# Patient Record
Sex: Female | Born: 1940 | Race: White | Hispanic: No | Smoking: Never smoker
Health system: Southern US, Community
[De-identification: ages and names within clinical notes are randomized; demographics above are authoritative.]

## PROBLEM LIST (undated history)

## (undated) DIAGNOSIS — E781 Pure hyperglyceridemia: Secondary | ICD-10-CM

## (undated) DIAGNOSIS — K219 Gastro-esophageal reflux disease without esophagitis: Secondary | ICD-10-CM

## (undated) DIAGNOSIS — B009 Herpesviral infection, unspecified: Secondary | ICD-10-CM

## (undated) DIAGNOSIS — M549 Dorsalgia, unspecified: Secondary | ICD-10-CM

## (undated) DIAGNOSIS — I10 Essential (primary) hypertension: Secondary | ICD-10-CM

## (undated) DIAGNOSIS — G20A1 Parkinson's disease without dyskinesia, without mention of fluctuations: Secondary | ICD-10-CM

## (undated) DIAGNOSIS — Z79899 Other long term (current) drug therapy: Secondary | ICD-10-CM

## (undated) DIAGNOSIS — R42 Dizziness and giddiness: Secondary | ICD-10-CM

## (undated) DIAGNOSIS — G20 Parkinson's disease: Secondary | ICD-10-CM

## (undated) DIAGNOSIS — K649 Unspecified hemorrhoids: Secondary | ICD-10-CM

## (undated) DIAGNOSIS — C50919 Malignant neoplasm of unspecified site of unspecified female breast: Secondary | ICD-10-CM

## (undated) DIAGNOSIS — F419 Anxiety disorder, unspecified: Secondary | ICD-10-CM

## (undated) DIAGNOSIS — G8929 Other chronic pain: Secondary | ICD-10-CM

## (undated) DIAGNOSIS — K589 Irritable bowel syndrome without diarrhea: Secondary | ICD-10-CM

## (undated) DIAGNOSIS — G43909 Migraine, unspecified, not intractable, without status migrainosus: Secondary | ICD-10-CM

## (undated) DIAGNOSIS — Z8673 Personal history of transient ischemic attack (TIA), and cerebral infarction without residual deficits: Secondary | ICD-10-CM

## (undated) DIAGNOSIS — R8281 Pyuria: Secondary | ICD-10-CM

## (undated) DIAGNOSIS — R251 Tremor, unspecified: Secondary | ICD-10-CM

## (undated) HISTORY — PX: ABDOMINAL HYSTERECTOMY: SHX81

## (undated) HISTORY — PX: BREAST SURGERY: SHX581

## (undated) HISTORY — DX: Pyuria: R82.81

## (undated) HISTORY — PX: MASTECTOMY: SHX3

## (undated) HISTORY — DX: Malignant neoplasm of unspecified site of unspecified female breast: C50.919

## (undated) HISTORY — PX: KNEE ARTHROSCOPY: SHX127

## (undated) HISTORY — PX: TONSILLECTOMY: SUR1361

## (undated) HISTORY — DX: Herpesviral infection, unspecified: B00.9

## (undated) HISTORY — PX: LUMBAR DISC SURGERY: SHX700

## (undated) HISTORY — DX: Other long term (current) drug therapy: Z79.899

## (undated) HISTORY — PX: TUBAL LIGATION: SHX77

## (undated) HISTORY — DX: Parkinson's disease without dyskinesia, without mention of fluctuations: G20.A1

## (undated) HISTORY — DX: Tremor, unspecified: R25.1

## (undated) HISTORY — DX: Personal history of transient ischemic attack (TIA), and cerebral infarction without residual deficits: Z86.73

## (undated) HISTORY — DX: Pure hyperglyceridemia: E78.1

## (undated) HISTORY — PX: HERNIA REPAIR: SHX51

## (undated) HISTORY — DX: Parkinson's disease: G20

---

## 2003-02-07 ENCOUNTER — Ambulatory Visit (HOSPITAL_COMMUNITY): Admission: RE | Admit: 2003-02-07 | Discharge: 2003-02-08 | Payer: Self-pay | Admitting: *Deleted

## 2003-02-07 ENCOUNTER — Encounter: Payer: Self-pay | Admitting: *Deleted

## 2003-04-21 ENCOUNTER — Ambulatory Visit (HOSPITAL_COMMUNITY): Admission: RE | Admit: 2003-04-21 | Discharge: 2003-04-21 | Payer: Self-pay | Admitting: *Deleted

## 2003-04-21 ENCOUNTER — Encounter: Payer: Self-pay | Admitting: *Deleted

## 2006-05-28 ENCOUNTER — Ambulatory Visit: Payer: Self-pay | Admitting: Internal Medicine

## 2006-06-11 ENCOUNTER — Ambulatory Visit: Payer: Self-pay | Admitting: Internal Medicine

## 2006-06-11 ENCOUNTER — Ambulatory Visit (HOSPITAL_COMMUNITY): Admission: RE | Admit: 2006-06-11 | Discharge: 2006-06-11 | Payer: Self-pay | Admitting: Internal Medicine

## 2014-02-10 LAB — HM COLONOSCOPY

## 2014-07-05 ENCOUNTER — Encounter: Payer: Self-pay | Admitting: Internal Medicine

## 2014-12-16 LAB — HM MAMMOGRAPHY

## 2015-03-10 LAB — HM COLONOSCOPY

## 2015-05-22 ENCOUNTER — Encounter: Payer: Self-pay | Admitting: *Deleted

## 2015-05-22 ENCOUNTER — Emergency Department
Admission: EM | Admit: 2015-05-22 | Discharge: 2015-05-22 | Disposition: A | Discharge status: Home / Self Care | Payer: Medicare Other | Source: Home / Self Care | Attending: Family Medicine | Admitting: Family Medicine

## 2015-05-22 DIAGNOSIS — I10 Essential (primary) hypertension: Secondary | ICD-10-CM | POA: Diagnosis not present

## 2015-05-22 HISTORY — DX: Dorsalgia, unspecified: M54.9

## 2015-05-22 HISTORY — DX: Dizziness and giddiness: R42

## 2015-05-22 HISTORY — DX: Migraine, unspecified, not intractable, without status migrainosus: G43.909

## 2015-05-22 HISTORY — DX: Essential (primary) hypertension: I10

## 2015-05-22 HISTORY — DX: Unspecified hemorrhoids: K64.9

## 2015-05-22 HISTORY — DX: Gastro-esophageal reflux disease without esophagitis: K21.9

## 2015-05-22 HISTORY — DX: Irritable bowel syndrome, unspecified: K58.9

## 2015-05-22 HISTORY — DX: Other chronic pain: G89.29

## 2015-05-22 HISTORY — DX: Anxiety disorder, unspecified: F41.9

## 2015-05-22 MED ORDER — ACEBUTOLOL HCL 200 MG PO CAPS: ORAL_CAPSULE | ORAL | Status: DC

## 2015-05-22 NOTE — Discharge Instructions (Signed)
Continue present medications.  Monitor Blood pressure daily and record.

## 2015-05-22 NOTE — ED Notes (Signed)
Dana Calderon is here today for her uncontrolled hypertension. She was seen in the ED on 05/20/2015 BP was 180/100. At d/c BP was 166/77 after lying down. No meds were given. Has a NP appt with Dr Ileene Rubens on 05/30/15.

## 2015-05-22 NOTE — ED Provider Notes (Signed)
CSN: [id number]     Arrival date & time 05/22/15  0905 History   First MD Initiated Contact with Patient 05/22/15 1001     Chief Complaint  Patient presents with  . Hypertension    HPI Comments: Patient reports that she is transferring her care to Dr. Ileene Rubens, with her first appointment in 8 days.  She presents today because she is concerned about her elevated BP.  Her BP two days ago was 180/100, and she has been having more frequent headaches during the past two weeks.  However, she also admits that she has been under increased stress:  Undergoing bankruptcy and family problems involving her son.  She states that her BP had been controlled with lisinopril 40mg  daily and Bystolic 30mg  daily.  She states that her insurance will no longer cover Bystolic 30mg  daily, and she is now taking 20mg  daily which is less effective.  Unfortunately she has allergies or intolerance to multiple medications.   The history is provided by the patient.    Past Medical History  Diagnosis Date  . Hypertension   . GERD (gastroesophageal reflux disease)   . Back pain, chronic   . Hemorrhoid   . Anxiety   . IBS (irritable bowel syndrome)   . Migraine   . Vertigo    Past Surgical History  Procedure Laterality Date  . Tonsillectomy    . Abdominal hysterectomy    . Tubal ligation    . Breast surgery    . Lumbar disc surgery    . Knee arthroscopy Left   . Hernia repair     Family History  Problem Relation Age of Onset  . Hypertension Mother   . Stroke Father    History  Substance Use Topics  . Smoking status: Never Smoker   . Smokeless tobacco: Not on file  . Alcohol Use: No   OB History    No data available     Review of Systems  Constitutional: Negative.   Eyes: Negative.   Respiratory: Negative.   Cardiovascular: Negative.   Gastrointestinal: Negative.   Genitourinary: Negative.   Musculoskeletal: Negative.   Skin: Negative.   Neurological: Positive for facial asymmetry and headaches.  Negative for dizziness, speech difficulty, weakness and numbness.    Allergies  Cefuroxime axetil; Verelan; Zithromax; Amlodipine besylate; Atenolol; Avelox; Bentyl; Cardizem; Ciprofloxacin; Clonidine derivatives; Diovan; Doxazosin mesylate; Doxycycline; Erythromycin; Fioricet; Flexeril; Furosemide; Hctz; Hydralazine; Influenza vaccines; Macrobid; Methylprednisolone; Metronidazole; Naproxen; Nexium; Norflex; Paxil; Penicillins; Prednisone; Prozac; Red dye; Spironolactone; Sulfur; Tamiflu; Tizanidine; Valium; Zoloft; and Asa  Home Medications   Prior to Admission medications   Medication Sig Start Date End Date Taking? Authorizing Provider  ALPRAZolam Duanne Moron) 0.25 MG tablet Take 0.25 mg by mouth at bedtime as needed for anxiety.   Yes Historical Provider, MD  clobetasol cream (TEMOVATE) 5.68 % Apply 1 application topically 2 (two) times daily.   Yes Historical Provider, MD  clotrimazole-betamethasone (LOTRISONE) cream Apply 1 application topically 2 (two) times daily.   Yes Historical Provider, MD  diphenoxylate-atropine (LOMOTIL) 2.5-0.025 MG per tablet Take by mouth 4 (four) times daily as needed for diarrhea or loose stools.   Yes Historical Provider, MD  lisinopril (PRINIVIL,ZESTRIL) 40 MG tablet Take 40 mg by mouth daily.   Yes Historical Provider, MD  meclizine (ANTIVERT) 25 MG tablet Take 25 mg by mouth 3 (three) times daily as needed for dizziness.   Yes Historical Provider, MD  Multiple Vitamin (MULTIVITAMIN) tablet Take 1 tablet by mouth daily.  Yes Historical Provider, MD  nebivolol (BYSTOLIC) 10 MG tablet Take 20 mg by mouth daily.   Yes Historical Provider, MD  omeprazole (PRILOSEC) 20 MG capsule Take 20 mg by mouth daily.   Yes Historical Provider, MD  primidone (MYSOLINE) 50 MG tablet Take by mouth 4 (four) times daily.   Yes Historical Provider, MD  UNKNOWN TO PATIENT    Yes Historical Provider, MD  acebutolol (SECTRAL) 200 MG capsule Take one-half tab po each evening for BP  05/22/15   Kandra Nicolas, MD   BP 180/82 mmHg  Pulse 70  Temp(Src) 99.1 F (37.3 C) (Oral)  Resp 16  Ht 5' 6.5" (1.689 m)  Wt 190 lb (86.183 kg)  BMI 30.21 kg/m2  SpO2 98% Physical Exam Nursing notes and Vital Signs reviewed. Appearance:  Patient appears stated age, and in no acute distress.  Patient is obese (BMI 30.2) Eyes:  Pupils are equal, round, and reactive to light and accomodation.  Extraocular movement is intact.  Conjunctivae are not inflamed.  Fundi benign   Nose:   Normal turbinates.  No sinus tenderness.    Pharynx:  Normal Neck:  Supple.  No adenopathy or thyromegaly.  Carotids have no bruits Lungs:  Clear to auscultation.  Breath sounds are equal.  Heart:  Regular rate and rhythm without murmurs, rubs, or gallops.  Abdomen:  Nontender without masses or hepatosplenomegaly.  Bowel sounds are present.  No CVA or flank tenderness.  Extremities:  No edema.  No calf tenderness Skin:  No rash present.   ED Course  Procedures None   MDM   1. Essential hypertension    Begin cautious trial of adding a generic beta selective beta blocker bisoprolol 5mg ,1/2 tab each evening (has similar profile to Bystolic) Rx #5, one ref.  Continue present medications.  Monitor Blood pressure daily and record. Followup PCP in 6 days as scheduled    Kandra Nicolas, MD 05/25/15 1019

## 2015-05-30 ENCOUNTER — Encounter: Payer: Self-pay | Admitting: Family Medicine

## 2015-05-30 ENCOUNTER — Ambulatory Visit (INDEPENDENT_AMBULATORY_CARE_PROVIDER_SITE_OTHER): Payer: Medicare Other | Admitting: Family Medicine

## 2015-05-30 VITALS — BP 194/90 | HR 56 | Ht 66.5 in | Wt 193.0 lb

## 2015-05-30 DIAGNOSIS — R7303 Prediabetes: Secondary | ICD-10-CM | POA: Insufficient documentation

## 2015-05-30 DIAGNOSIS — L219 Seborrheic dermatitis, unspecified: Secondary | ICD-10-CM | POA: Diagnosis not present

## 2015-05-30 DIAGNOSIS — I10 Essential (primary) hypertension: Secondary | ICD-10-CM | POA: Insufficient documentation

## 2015-05-30 DIAGNOSIS — R7309 Other abnormal glucose: Secondary | ICD-10-CM | POA: Diagnosis not present

## 2015-05-30 MED ORDER — BISOPROLOL FUMARATE 5 MG PO TABS: ORAL_TABLET | ORAL | Status: DC

## 2015-05-30 MED ORDER — KETOCONAZOLE 2 % EX SHAM: 1.0000 "application " | MEDICATED_SHAMPOO | CUTANEOUS | Status: DC

## 2015-05-30 NOTE — Progress Notes (Signed)
CC: Dana Calderon is a 74 y.o. female is here for Establish Care   Subjective: HPI:  Very pleasant 74 year old here to establish care  She reports a history of essential hypertension which has been well controlled up until 6 months ago. Around this time Bystolic had increased to 30 mg daily however her insurance will only cover 20 mg daily. She's also been taking lisinopril but the combination of these 2 with her insurance restrictions have been ineffective with controlling blood pressure. She was seen at a local urgent care last week and started on an additional beta blocker, bisoprolol. She brings in blood pressures that reflect that her blood pressure has slightly improved but only from stage II hypertension to stage I hypertension since starting this medication. 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 Nothing else seems to make symptoms better or worse.  She reports a history of prediabetes with last 6 years. A1c has never been in diabetic range and most recently in April was 6.1. She tries to watch what she eats, she once did not do this to see if it would affect her A1c and it didn't do any change whatsoever.  Complains of itchiness of her scalp left greater than right. It's been present for an unknown amount of time however she's been prescribed clobetasol which does not seem to be helping. She also tells me she has an intolerance to prednisone and has only been using the clobetasol once a day and fears of having an intolerance.  She denies any flaking or skin changes other than a warmth and an itch. His present on a daily basis mild in severity  Review of Systems - General ROS: negative for - chills, fever, night sweats, weight gain or weight loss Ophthalmic ROS: negative for - decreased vision Psychological ROS: negative for - anxiety or depression ENT ROS: negative  for - hearing change, nasal congestion, tinnitus or allergies Hematological and Lymphatic ROS: negative for - bleeding problems, bruising or swollen lymph nodes Breast ROS: negative Respiratory ROS: no cough, shortness of breath, or wheezing Cardiovascular ROS: no chest pain or dyspnea on exertion Gastrointestinal ROS: no abdominal pain, change in bowel habits, or black or bloody stools Genito-Urinary ROS: negative for - genital discharge, genital ulcers, incontinence or abnormal bleeding from genitals Musculoskeletal ROS: negative for - joint pain or muscle pain Neurological ROS: negative for - headaches or memory loss Dermatological ROS: negative for lumps, mole changes, rash and skin lesion changes other than that described above  Past Medical History  Diagnosis Date  . Hypertension   . GERD (gastroesophageal reflux disease)   . Back pain, chronic   . Hemorrhoid   . Anxiety   . IBS (irritable bowel syndrome)   . Migraine   . Vertigo     Past Surgical History  Procedure Laterality Date  . Tonsillectomy    . Abdominal hysterectomy    . Tubal ligation    . Breast surgery    . Lumbar disc surgery    . Knee arthroscopy Left   . Hernia repair     Family History  Problem Relation Age of Onset  . Hypertension Mother   . Stroke Father     History   Social History  . Marital Status: Divorced    Spouse Name: N/A  . Number of Children: N/A  . Years of Education:  N/A   Occupational History  . Not on file.   Social History Main Topics  . Smoking status: Never Smoker   . Smokeless tobacco: Not on file  . Alcohol Use: No  . Drug Use: No  . Sexual Activity: Not Currently   Other Topics Concern  . Not on file   Social History Narrative     Objective: BP 194/90 mmHg  Pulse 56  Ht 5' 6.5" (1.689 m)  Wt 193 lb (87.544 kg)  BMI 30.69 kg/m2  General: Alert and Oriented, No Acute Distress HEENT: Pupils equal, round, reactive to light. Conjunctivae clear.  Moist meters  membranes Unremarkable.  Lungs: Clear to auscultation bilaterally, no wheezing/ronchi/rales.  Comfortable work of breathing. Good air movement. Cardiac: Regular rate and rhythm. Normal S1/S2.  No murmurs, rubs, nor gallops.   Extremities: No peripheral edema.  Strong peripheral pulses.  Mental Status: No depression, anxiety, nor agitation. Skin: Warm and dry. Scalp unremarkable without any flaking or hair loss, if anything the only abnormality would be somewhat dry skin.  Assessment & Plan: Dana Calderon was seen today for establish care.  Diagnoses and all orders for this visit:  Essential hypertension Orders: -     bisoprolol (ZEBETA) 5 MG tablet; Full TABLET BY MOUTH EACH EVENING  Prediabetes  Seborrheic dermatitis Orders: -     ketoconazole (NIZORAL) 2 % shampoo; Apply 1 application topically 2 (two) times a week. Use as a shampoo.   Essential hypertension: Chronic uncontrolled condition increasing bisoprolol to continue with bistolic and lisinopril. Check daily blood pressures and drop off a log in 5 days, if normotensive then she can push out follow-up to 3 months Prediabetes: Clinically controlled repeat A1c in July-September Seborrheic dermatitis: Trial of ketoconazole to the scalp instead of clobetasol  Return in about 3 months (around 08/30/2015) for Drop off BP log in five days.Marland Kitchen

## 2015-06-06 ENCOUNTER — Telehealth: Payer: Self-pay | Admitting: Family Medicine

## 2015-06-06 NOTE — Telephone Encounter (Signed)
Patient walked-in and adv that she has been taking Bp med Bisoprolol 1 whole pill and it has been giving her really bad diarrhea and would like to know if she can just take a half of pill instead of taking a whole pill. Needed a Dr.'s opinion pt would like to get a call back from the nurse to be adv. Thanks

## 2015-06-07 NOTE — Telephone Encounter (Signed)
OK to split tab but remind her she is also supposed to drop off BP record sometime next week for Dr. Ileene Rubens to review.

## 2015-06-08 NOTE — Telephone Encounter (Signed)
Pt.notified

## 2015-06-12 ENCOUNTER — Other Ambulatory Visit: Payer: Self-pay | Admitting: Family Medicine

## 2015-06-14 ENCOUNTER — Telehealth: Payer: Self-pay | Admitting: Family Medicine

## 2015-06-14 MED ORDER — CARVEDILOL 12.5 MG PO TABS: 12.5000 mg | ORAL_TABLET | Freq: Two times a day (BID) | ORAL | Status: DC

## 2015-06-14 NOTE — Telephone Encounter (Signed)
Pt notified and voiced understanding 

## 2015-06-14 NOTE — Telephone Encounter (Signed)
Dana Calderon, Will you please thank patient for dropping off her BP log.  Since she's intolerant of taking a full tab of bisoprolol and her insurance complicates how much Bystolic can be prescribed I'd recommend stopping both of these and switching to a different type of beta blocker called carvedilol that [facility].  This should be taken in addition to lisinopril.  Can she please drop off another BP log after starting this medication? It only needs to be a week long.

## 2015-06-16 ENCOUNTER — Telehealth: Payer: Self-pay

## 2015-06-16 NOTE — Telephone Encounter (Signed)
Dana Calderon complains of itching in mouth and ears after taking Coreg. She took benadryl and this helped with her symptoms.

## 2015-06-16 NOTE — Telephone Encounter (Signed)
It's possible it could be a direct side effect however I've never heard of that type of reaction and don't see any documented reaction like this from the manufacturer.  I'd recommend continuing it over the weekend and if symptoms persist I'll be happy to switch to a different beta-blocker.

## 2015-06-16 NOTE — Telephone Encounter (Signed)
Patient advised.

## 2015-06-22 ENCOUNTER — Telehealth: Payer: Self-pay | Admitting: *Deleted

## 2015-06-22 NOTE — Telephone Encounter (Signed)
Pt left vm this am stating that since starting the carvedilol, her pulse has increased to around 75.  In her vm she stated that she had some kind of bug yesterday and had several bouts of diarrhea.  She stated that she felt a little better today so she took a shower.  Upon getting out of the shower, she started feeling dizzy so she got some water & was going to lay down.  When I called her back, she stated that her chest was starting to feel funny as well as her head was starting to hurt.  She voiced that she felt like she needed to go see someone.  I agreed immediately and had Kelsi call 911 while I remained on the phone with Ms. Dana Calderon.  While waiting for the ambulance, I had her check her vitals; BP was 172/90 p64.  Her pressure earlier this morning was 124/69.  I remained on the line with her until I heard her open the door for EMS.  Just an FYI.

## 2015-06-30 ENCOUNTER — Encounter: Payer: Self-pay | Admitting: Family Medicine

## 2015-06-30 ENCOUNTER — Ambulatory Visit (INDEPENDENT_AMBULATORY_CARE_PROVIDER_SITE_OTHER): Payer: Medicare Other | Admitting: Family Medicine

## 2015-06-30 VITALS — BP 141/69 | HR 71 | Wt 196.0 lb

## 2015-06-30 DIAGNOSIS — K1370 Unspecified lesions of oral mucosa: Secondary | ICD-10-CM

## 2015-06-30 DIAGNOSIS — F419 Anxiety disorder, unspecified: Secondary | ICD-10-CM

## 2015-06-30 DIAGNOSIS — F450 Somatization disorder: Secondary | ICD-10-CM

## 2015-06-30 DIAGNOSIS — R079 Chest pain, unspecified: Secondary | ICD-10-CM | POA: Insufficient documentation

## 2015-06-30 DIAGNOSIS — I10 Essential (primary) hypertension: Secondary | ICD-10-CM | POA: Diagnosis not present

## 2015-06-30 MED ORDER — CARVEDILOL 12.5 MG PO TABS: 12.5000 mg | ORAL_TABLET | Freq: Two times a day (BID) | ORAL | Status: DC

## 2015-06-30 NOTE — Progress Notes (Signed)
CC: Dana Calderon is a 74 y.o. female is here for hospital f/u   Subjective: HPI:  Hospital follow-up from last week. She awoke with chest pain that radiated from the center of her chest into her back. When she started to feel lightheaded after taking a shower she called EMS. She was taken to a local hospital had serial troponins that were normal, she had an unremarkable chest x-ray and a EKG that only showed right bundle branch block. She also had some blood work done all of which was unremarkable. Pain eventually subsided after a few hours of the hospital. She was advised to get a treadmill stress echo however she did not feel like she had the stamina for this, a pharmacologic stress test was then recommended however she was fearful that the injection would cause an allergic reaction. She and the hospital team reached a compromise with a nuclear medicine stress test however within a few seconds of receiving the tracer in her vein she had intense burning that slowly "circulated around my body." This test was ultimately abandoned. She has not had any return of her chest discomfort since the day it came on. She denies any cough, wheezing, shortness of breath, abdominal pain, back pain, motor or sensory disturbances.  She brings in a blood pressure log showing over 90% of her blood pressures being in the normotensive range.  She has a lesion on the buccal surface of her right cheek that has been present for 1-2 weeks. Slightly tender to touch. She thinks she keeps biting in her sleep. She has no history of tobacco use and rarely drinks alcohol.  Review Of Systems Outlined In HPI  Past Medical History  Diagnosis Date  . Hypertension   . GERD (gastroesophageal reflux disease)   . Back pain, chronic   . Hemorrhoid   . Anxiety   . IBS (irritable bowel syndrome)   . Migraine   . Vertigo     Past Surgical History  Procedure Laterality Date  . Tonsillectomy    . Abdominal hysterectomy    . Tubal  ligation    . Breast surgery    . Lumbar disc surgery    . Knee arthroscopy Left   . Hernia repair     Family History  Problem Relation Age of Onset  . Hypertension Mother   . Stroke Father     History   Social History  . Marital Status: Divorced    Spouse Name: N/A  . Number of Children: N/A  . Years of Education: N/A   Occupational History  . Not on file.   Social History Main Topics  . Smoking status: Never Smoker   . Smokeless tobacco: Not on file  . Alcohol Use: No  . Drug Use: No  . Sexual Activity: Not Currently   Other Topics Concern  . Not on file   Social History Narrative     Objective: BP 141/69 mmHg  Pulse 71  Wt 196 lb (88.905 kg)  General: Alert and Oriented, No Acute Distress HEENT: Pupils equal, round, reactive to light. Conjunctivae clear.  Moist mucous membranes. 5 mm diameter rod shaped fleshy lesion on the right buccal aspect of her cheek, no cauliflower appearance Lungs: Clear to auscultation bilaterally, no wheezing/ronchi/rales.  Comfortable work of breathing. Good air movement. Cardiac: Regular rate and rhythm. Normal S1/S2.  No murmurs, rubs, nor gallops.   Extremities: No peripheral edema.  Strong peripheral pulses.  Mental Status: No depression, anxiety, nor agitation.  Skin: Warm and dry.  Assessment & Plan: Raylie was seen today for hospital f/u.  Diagnoses and all orders for this visit:  Chest pain, unspecified chest pain type  Anxiety  Essential hypertension  Mouth lesion  Other orders -     carvedilol (COREG) 12.5 MG tablet; Take 1 tablet (12.5 mg total) by mouth 2 (two) times daily with a meal.   Chest pain: I also agree this is most likely due to anxiety, with the current situation she is facing helping her son through a divorce Essential hypertension: Controlled Mouth lesion: Urgent referral to ear nose and throat for second opinion since I cannot completely rule out a neoplasm. Discussed that it is reassuring that  there is no cauliflower appearance and that it is smooth however it would be best to see a specialist. She thinks this is a bit aggressive final joint decision was to follow-up in a few weeks to see if it has resolved on its own. This could simply be due to trauma from her biting her cheek  25 minutes spent face-to-face during visit today of which at least 50% was counseling or coordinating care regarding: 1. Chest pain, unspecified chest pain type   2. Anxiety   3. Essential hypertension   4. Mouth lesion       No Follow-up on file.

## 2015-07-06 ENCOUNTER — Telehealth: Payer: Self-pay | Admitting: *Deleted

## 2015-07-07 ENCOUNTER — Telehealth: Payer: Self-pay | Admitting: *Deleted

## 2015-07-07 MED ORDER — CARVEDILOL 25 MG PO TABS: 25.0000 mg | ORAL_TABLET | Freq: Two times a day (BID) | ORAL | Status: DC

## 2015-07-07 NOTE — Telephone Encounter (Signed)
I would agree that a higher dose of carvediolol would be beneficial, [facility]  Please drop off a one week long BP diary after making this switch.

## 2015-07-07 NOTE — Telephone Encounter (Signed)
Pt notified of new rx & to keep a BP log for the next week.

## 2015-07-07 NOTE — Telephone Encounter (Signed)
Pt left vm stating that her BP is still really elevated.  She went to the ED last night with 179/112.  She said this morning it's already 161/94.  She states that she feels like what she's going through in her life is causing this but she's wondering if she needs a higher dose of carvedilol.  Please advise.

## 2015-07-10 ENCOUNTER — Encounter: Payer: Self-pay | Admitting: *Deleted

## 2015-07-18 ENCOUNTER — Encounter: Payer: Self-pay | Admitting: Family Medicine

## 2015-07-18 ENCOUNTER — Ambulatory Visit (INDEPENDENT_AMBULATORY_CARE_PROVIDER_SITE_OTHER): Payer: Medicare Other | Admitting: Family Medicine

## 2015-07-18 VITALS — BP 191/85 | HR 66 | Wt 195.0 lb

## 2015-07-18 DIAGNOSIS — I10 Essential (primary) hypertension: Secondary | ICD-10-CM | POA: Diagnosis not present

## 2015-07-18 MED ORDER — CARVEDILOL 25 MG PO TABS: 12.5000 mg | ORAL_TABLET | Freq: Two times a day (BID) | ORAL | Status: DC

## 2015-07-18 MED ORDER — AZILSARTAN MEDOXOMIL 40 MG PO TABS: 40.0000 mg | ORAL_TABLET | Freq: Every day | ORAL | Status: DC

## 2015-07-18 NOTE — Progress Notes (Signed)
CC: Dana Calderon is a 74 y.o. female is here for Hypertension   Subjective: HPI:  Follow-up essential hypertension: Since I saw her last she increased her carvedilol to 25 mg twice a day. She brings in blood pressure showing that this only minimally improved her stage I and stage II hypertensive readings at home. She was getting lightheadedness every morning when taking this dose. She cut back down to 1-1/2 of the 12.5 mg twice a day and had no improvement with blood pressure but mild lightheadedness. She is currently also taking 40 mg of lisinopril. 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 Medications are currently only mildly helping her blood pressure. Since I saw her last she denies any new chest pain, motor or sensory disturbances, orthopnea nor peripheral edema.   Review Of Systems Outlined In HPI  Past Medical History  Diagnosis Date  . Hypertension   . GERD (gastroesophageal reflux disease)   . Back pain, chronic   . Hemorrhoid   . Anxiety   . IBS (irritable bowel syndrome)   . Migraine   . Vertigo     Past Surgical History  Procedure Laterality Date  . Tonsillectomy    . Abdominal hysterectomy    . Tubal ligation    . Breast surgery    . Lumbar disc surgery    . Knee arthroscopy Left   . Hernia repair     Family History  Problem Relation Age of Onset  . Hypertension Mother   . Stroke Father     Social History   Social History  . Marital Status: Divorced    Spouse Name: N/A  . Number of Children: N/A  . Years of Education: N/A   Occupational History  . Not on file.   Social History Main Topics  . Smoking status: Never Smoker   . Smokeless tobacco: Not on file  . Alcohol Use: No  . Drug Use: No  . Sexual Activity: Not Currently   Other Topics Concern  . Not on file   Social History Narrative     Objective: BP 191/85 mmHg  Pulse 66  Wt 195 lb (88.451 kg)  Vital signs reviewed. General: Alert and  Oriented, No Acute Distress HEENT: Pupils equal, round, reactive to light. Conjunctivae clear.  External ears unremarkable.  Moist mucous membranes. Lungs: Clear and comfortable work of breathing, speaking in full sentences without accessory muscle use. Cardiac: Regular rate and rhythm.  Neuro: CN II-XII grossly intact, gait normal. Extremities: No peripheral edema.  Strong peripheral pulses.  Mental Status: No depression, anxiety, nor agitation. Logical though process. Skin: Warm and dry. Assessment & Plan: [name] was seen today for hypertension.  Diagnoses and all orders for this visit:  Essential hypertension -     Azilsartan Medoxomil (EDARBI) 40 MG TABS; Take 40 mg by mouth daily.  Other orders -     carvedilol (COREG) 25 MG tablet; Take 0.5 tablets (12.5 mg total) by mouth 2 (two) times daily with a meal.   Essential hypertension: Uncontrolled chronic condition cutting back carvedilol to a total of 12.5 mg twice a day since this provided no side effects. Stop lisinopril and begin Edarbi. Continue to take daily blood pressures.Signs and symptoms requring emergent/urgent reevaluation were discussed with the patient.  25 minutes spent face-to-face during visit today of which at least 50% was counseling or coordinating care regarding: 1. Essential hypertension      Return in about 2  weeks (around 08/01/2015).

## 2015-07-20 ENCOUNTER — Telehealth: Payer: Self-pay | Admitting: *Deleted

## 2015-07-20 ENCOUNTER — Encounter: Payer: Self-pay | Admitting: Family Medicine

## 2015-07-20 MED ORDER — CHLORTHALIDONE 50 MG PO TABS: 50.0000 mg | ORAL_TABLET | Freq: Every day | ORAL | Status: DC

## 2015-07-20 NOTE — Telephone Encounter (Signed)
Pt left a message stating that for that past two nights she has cut the Edarbe in half an has only taken 20 mg. She says it makes her heart pound, she has hot flashes and her feet are starting to swell. She says she does not think she can take the medication. Please advise

## 2015-07-20 NOTE — Telephone Encounter (Signed)
Seth Bake, Will you please let patient know that i've added this to her intolerance list, I'd recommend switching to chlorthalidone [facility].  If this does not work or causes an intolerance then I've run out of all anti-hypertensive options. Continue carvedilol.  The only remaining option would be that I would write a letter to her insurance company asking that they reconsider better coverage of bistolic since she has proven to be intolerant to literally all other medication classes.

## 2015-07-20 NOTE — Telephone Encounter (Signed)
Pt.notified

## 2015-07-24 ENCOUNTER — Telehealth: Payer: Self-pay | Admitting: Family Medicine

## 2015-07-24 MED ORDER — NEBIVOLOL HCL 20 MG PO TABS: ORAL_TABLET | ORAL | Status: DC

## 2015-07-24 NOTE — Telephone Encounter (Signed)
Pt.notified

## 2015-07-24 NOTE — Telephone Encounter (Signed)
Seth Bake, Will you please thank patient for dropping off her bp log.  Her numbers look much improved since starting half a chlorthalidone. Carvedilol should not be taken along with bystolic due to a risk of lowering the heart rate to a point where an individual might pass out.  I'd recommend continuing the carvediolol and chlorthalidone.

## 2015-07-24 NOTE — Telephone Encounter (Signed)
Pt states the chlorathalidone upsets he stomach. It causes her to have diarrhea

## 2015-07-24 NOTE — Telephone Encounter (Signed)
Seth Bake, Will you please let patient know that a 30mg  regimen of bystolic [facility].  If her insurance denies this I will write them a letter for an appeal.  With her extensive list of medication intolerances this is the last option I know of.  If this gets approved then stop taking carvediolol and chlorthalidone.

## 2015-07-27 LAB — FECAL OCCULT BLOOD, GUAIAC: FECAL OCCULT BLD: NEGATIVE

## 2015-08-01 ENCOUNTER — Ambulatory Visit (INDEPENDENT_AMBULATORY_CARE_PROVIDER_SITE_OTHER): Payer: Medicare Other | Admitting: Family Medicine

## 2015-08-01 ENCOUNTER — Encounter: Payer: Self-pay | Admitting: Family Medicine

## 2015-08-01 VITALS — BP 151/79 | HR 63 | Wt 194.0 lb

## 2015-08-01 DIAGNOSIS — M25512 Pain in left shoulder: Secondary | ICD-10-CM | POA: Diagnosis not present

## 2015-08-01 DIAGNOSIS — A084 Viral intestinal infection, unspecified: Secondary | ICD-10-CM | POA: Diagnosis not present

## 2015-08-01 DIAGNOSIS — B009 Herpesviral infection, unspecified: Secondary | ICD-10-CM

## 2015-08-01 DIAGNOSIS — I10 Essential (primary) hypertension: Secondary | ICD-10-CM

## 2015-08-01 MED ORDER — VALACYCLOVIR HCL 500 MG PO TABS: 500.0000 mg | ORAL_TABLET | Freq: Two times a day (BID) | ORAL | Status: DC

## 2015-08-01 NOTE — Progress Notes (Signed)
CC: Dana Calderon is a 74 y.o. female is here for hospital f/u   Subjective: HPI:  Hospital follow up for dehydration and viral gastroenteritis. She was admitted for one day after she was having intractable vomiting, diarrhea with a white count in the 20s. With IV hydration she was available to start keeping down Fluids within 12 hours after admission. CT scan of abdomen and pelvis was unremarkable. All other blood work was unremarkable. Since returning home she is been able to pick up Bystolic. She is tolerating this without any known side effects. She is taking blood pressures at home and arranging from the prehypertensive state to stage I hypertension. She has not had a bowel movement since returning home but has regained all of her hunger without any nausea.  Complains of left shoulder pain that has been present for the last week. It happened suddenly when she was pushing a heavy object across the floor. Symptoms have been persistent ever since. It's worse when taking his shirt off, reaching behind her back, or lying on the left shoulder. It's described as a pulling sensation. It is moderate in severity but absent at rest. She denies any motor or sensory disturbances in the left upper extremity. No intervention as of yet  Complains of a lesion in the mouth localized on the left lower anterior inside cheek. Moderately tender to the touch. It came on within the last 12 hours. The lesion on the right side of her cheek that was observed weeks ago is no longer bothering her. She was also found to have an HSV infection in the vagina while she was in the hospital this last weekend. Since this was present for matter of days antiviral medication was not started.  Denies fevers, chills, chest pain, shortness of breath, abdominal pain, nor any genitourinary complaints other than that described above.   Review Of Systems Outlined In HPI  Past Medical History  Diagnosis Date  . Hypertension   . GERD  (gastroesophageal reflux disease)   . Back pain, chronic   . Hemorrhoid   . Anxiety   . IBS (irritable bowel syndrome)   . Migraine   . Vertigo     Past Surgical History  Procedure Laterality Date  . Tonsillectomy    . Abdominal hysterectomy    . Tubal ligation    . Breast surgery    . Lumbar disc surgery    . Knee arthroscopy Left   . Hernia repair    . Mastectomy     Family History  Problem Relation Age of Onset  . Hypertension Mother   . Stroke Father     Social History   Social History  . Marital Status: Divorced    Spouse Name: N/A  . Number of Children: N/A  . Years of Education: N/A   Occupational History  . Not on file.   Social History Main Topics  . Smoking status: Never Smoker   . Smokeless tobacco: Not on file  . Alcohol Use: No  . Drug Use: No  . Sexual Activity: Not Currently   Other Topics Concern  . Not on file   Social History Narrative     Objective: BP 151/79 mmHg  Pulse 63  Wt 194 lb (87.998 kg)  General: Alert and Oriented, No Acute Distress HEENT: Pupils equal, round, reactive to light. Conjunctivae clear. Moist mucous membranes are unremarkable. There is a 2 mm slightly raised knot the right buccal surface of the cheek just proximal to  the corner of the lip, this no longer has any ulcerated appearance. Its the same color as the surrounding mucosa. There is a 1 mm diameter red shallow ulceration on the cheek facing the left lower canine tooth, quite tender to the touch.  Lungs:Clear comfortable work of breathing  Cardiac: Regular rate and rhythm.  Left shoulder exam reveals full range of motion and strength in all planes of motion and with individual rotator cuff testing. No overlying redness warmth or swelling.  Neer's test negative.  Hawkins test positive. Empty can negative. Crossarm test negative. O'Brien's test negative. Apprehension test negative. Speed's test negative. Extremities: No peripheral edema.  Strong peripheral pulses.   Mental Status: No depression, anxiety, nor agitation. Skin: Warm and dry.  Assessment & Plan: Eola was seen today for hospital f/u.  Diagnoses and all orders for this visit:  HSV (herpes simplex virus) infection -     valACYclovir (VALTREX) 500 MG tablet; Take 1 tablet (500 mg total) by mouth 2 (two) times daily.  Left shoulder pain  Essential hypertension  Viral gastroenteritis   HSV: Discussed that her genital lesions are unlikely to respond to Valtrex given how long they've been present however the oral lesion should respond nicely to the above 3 day regimen. Left shoulder pain: Suspect rotator cuff tear therefore start home rehabilitation exercises for the next 2-3 weeks. Essential hypertension: She is only been on Bystolic for a little under 24 hours now, please drop off blood pressure log after been on this medication for 1 week. Continue chlorthalidone and Bystolic. Viral gastroenteritis: Completely resolved.  40 minutes spent face-to-face during visit today of which at least 50% was counseling or coordinating care regarding: 1. HSV (herpes simplex virus) infection   2. Left shoulder pain   3. Essential hypertension   4. Viral gastroenteritis   And reviewing hospital records and the presence of the patient.   Return in about 4 weeks (around 08/29/2015).

## 2015-08-09 ENCOUNTER — Encounter: Payer: Self-pay | Admitting: Family Medicine

## 2015-08-09 ENCOUNTER — Ambulatory Visit (INDEPENDENT_AMBULATORY_CARE_PROVIDER_SITE_OTHER): Payer: Medicare Other | Admitting: Family Medicine

## 2015-08-09 VITALS — BP 176/84 | HR 58 | Wt 194.0 lb

## 2015-08-09 DIAGNOSIS — R300 Dysuria: Secondary | ICD-10-CM | POA: Diagnosis not present

## 2015-08-09 DIAGNOSIS — N9089 Other specified noninflammatory disorders of vulva and perineum: Secondary | ICD-10-CM | POA: Diagnosis not present

## 2015-08-09 DIAGNOSIS — I4510 Unspecified right bundle-branch block: Secondary | ICD-10-CM

## 2015-08-09 LAB — POCT URINALYSIS DIPSTICK
BILIRUBIN UA: NEGATIVE
Glucose, UA: NEGATIVE
KETONES UA: NEGATIVE
Nitrite, UA: NEGATIVE
PH UA: 7
PROTEIN UA: NEGATIVE
SPEC GRAV UA: 1.01
Urobilinogen, UA: 0.2

## 2015-08-09 MED ORDER — CEPHALEXIN 500 MG PO CAPS
500.0000 mg | ORAL_CAPSULE | Freq: Three times a day (TID) | ORAL | Status: DC
Start: 2015-08-09 — End: 2015-08-29

## 2015-08-09 NOTE — Progress Notes (Signed)
CC: Dana Calderon is a 74 y.o. female is here for Dysuria   Subjective: HPI:  Complains of dysuria and frequency that began overnight last night. Symptoms are bad enough to keep her awake from falling asleep. Symptoms are moderate in severity and have not responded to increased fluids or taking cranberry tablets. No other interventions as of yet. Symptoms feel like a urinary tract infection, her last one was well over a year ago. She denies any flank pain or abdominal pain.  Since I saw her last she has also been experiencing some labial irritation without any vaginal discharge. She took Valtrex for suspicion of HSV however this cleared up a sore on her lip but did nothing to the genital region. She's been using clotrimazole which is only mildly effective.  She was told during a recent hospitalization that she has a right bundle blanch block. She's having trouble conceptualizing what this actually means. She wants to know if this means that her heart is damaged. She denies any chest pain shortness of breath orthopnea nor peripheral edema.   Review Of Systems Outlined In HPI  Past Medical History  Diagnosis Date  . Hypertension   . GERD (gastroesophageal reflux disease)   . Back pain, chronic   . Hemorrhoid   . Anxiety   . IBS (irritable bowel syndrome)   . Migraine   . Vertigo     Past Surgical History  Procedure Laterality Date  . Tonsillectomy    . Abdominal hysterectomy    . Tubal ligation    . Breast surgery    . Lumbar disc surgery    . Knee arthroscopy Left   . Hernia repair    . Mastectomy     Family History  Problem Relation Age of Onset  . Hypertension Mother   . Stroke Father     Social History   Social History  . Marital Status: Divorced    Spouse Name: N/A  . Number of Children: N/A  . Years of Education: N/A   Occupational History  . Not on file.   Social History Main Topics  . Smoking status: Never Smoker   . Smokeless tobacco: Not on file  .  Alcohol Use: No  . Drug Use: No  . Sexual Activity: Not Currently   Other Topics Concern  . Not on file   Social History Narrative     Objective: BP 176/84 mmHg  Pulse 58  Wt 194 lb (87.998 kg)  Vital signs reviewed. General: Alert and Oriented, No Acute Distress HEENT: Pupils equal, round, reactive to light. Conjunctivae clear.  External ears unremarkable.  Moist mucous membranes. Lungs: Clear and comfortable work of breathing, speaking in full sentences without accessory muscle use. Cardiac: Regular rate and rhythm.  Neuro: CN II-XII grossly intact, gait normal. Extremities: No peripheral edema.  Strong peripheral pulses.  Mental Status: No depression, anxiety, nor agitation. Logical though process. Skin: Warm and dry.  Assessment & Plan: Ronasia was seen today for dysuria.  Diagnoses and all orders for this visit:  Dysuria -     Urinalysis Dipstick -     cephALEXin (KEFLEX) 500 MG capsule; Take 1 capsule (500 mg total) by mouth 3 (three) times daily. -     Urine Culture  RBBB  Labial irritation  Dysuria: She is tolerated cephalexin in the past despite her report of shortness of breath with Cefuroxime.  Start 3 times a day dosing pending culture.   right bundle branch block: Point of  this goes back all the way until 2014 on EKGs. Discussed the benign nature of this finding in a symptomatically patient like herself. I had to draw pictures of the heart and the conduction system to help her understand what this branch block actually represents. Labial irritation: Stop clotrimazole start using clobetasol or Lotrisone at home, which ever one she has an abundance of.  40 minutes spent face-to-face during visit today of which at least 50% was counseling or coordinating care regarding: 1. Dysuria   2. RBBB   3. Labial irritation      No Follow-up on file.

## 2015-08-12 LAB — URINE CULTURE: Colony Count: >=100000

## 2015-08-28 ENCOUNTER — Encounter: Payer: Self-pay | Admitting: *Deleted

## 2015-08-29 ENCOUNTER — Encounter: Payer: Self-pay | Admitting: Family Medicine

## 2015-08-29 ENCOUNTER — Ambulatory Visit (INDEPENDENT_AMBULATORY_CARE_PROVIDER_SITE_OTHER): Payer: Medicare Other | Admitting: Family Medicine

## 2015-08-29 VITALS — BP 171/67 | HR 59 | Wt 201.0 lb

## 2015-08-29 DIAGNOSIS — R7303 Prediabetes: Secondary | ICD-10-CM

## 2015-08-29 DIAGNOSIS — I10 Essential (primary) hypertension: Secondary | ICD-10-CM | POA: Diagnosis not present

## 2015-08-29 DIAGNOSIS — Z23 Encounter for immunization: Secondary | ICD-10-CM | POA: Diagnosis not present

## 2015-08-29 DIAGNOSIS — R7309 Other abnormal glucose: Secondary | ICD-10-CM | POA: Diagnosis not present

## 2015-08-29 DIAGNOSIS — K1370 Unspecified lesions of oral mucosa: Secondary | ICD-10-CM | POA: Diagnosis not present

## 2015-08-29 MED ORDER — NEBIVOLOL HCL 20 MG PO TABS: ORAL_TABLET | ORAL | Status: DC

## 2015-08-29 NOTE — Progress Notes (Signed)
CC: Dana Calderon is a 74 y.o. female is here for prediabetes   Subjective: HPI:  Follow-up prediabetes: She is trying to stay more active by exercising during commercial breaks on TV. She is also trying to walk more when out shopping. Her last A1c was in July and was in prediabetic range. Denies polyuria or polyphagia or polydipsia.  Follow-up essential hypertension: She brings in blood pressures today showing that 90% of her readings are in the stage I hypertension range and 10% at stage II. She is taking Bystolic as prescribed with no other antihypertensives. Denies chest pain shortness of breath orthopnea nor peripheral edema  She tells me she keeps biting the lesion in her mouth on the right cheek. It usually heals after a few days of leaving it alone. Is occasionally painful. She does not think it's getting bigger or smaller since it was brought to my attention a few months ago. Denies swollen lymph nodes, unintentional weight loss or fever.   Review Of Systems Outlined In HPI  Past Medical History  Diagnosis Date  . Hypertension   . GERD (gastroesophageal reflux disease)   . Back pain, chronic   . Hemorrhoid   . Anxiety   . IBS (irritable bowel syndrome)   . Migraine   . Vertigo     Past Surgical History  Procedure Laterality Date  . Tonsillectomy    . Abdominal hysterectomy    . Tubal ligation    . Breast surgery    . Lumbar disc surgery    . Knee arthroscopy Left   . Hernia repair    . Mastectomy     Family History  Problem Relation Age of Onset  . Hypertension Mother   . Stroke Father     Social History   Social History  . Marital Status: Divorced    Spouse Name: N/A  . Number of Children: N/A  . Years of Education: N/A   Occupational History  . Not on file.   Social History Main Topics  . Smoking status: Never Smoker   . Smokeless tobacco: Not on file  . Alcohol Use: No  . Drug Use: No  . Sexual Activity: Not Currently   Other Topics Concern  .  Not on file   Social History Narrative     Objective: BP 171/67 mmHg  Pulse 59  Wt 201 lb (91.173 kg)  General: Alert and Oriented, No Acute Distress HEENT: Pupils equal, round, reactive to light. Conjunctivae clear. Moist mucous membranes pharynx unremarkable. There is a nontender approximate half centimeter nodule on the buccal mucosa of the right cheek which is smooth and the same color of surrounding mucosa. Lungs: Clear to auscultation bilaterally, no wheezing/ronchi/rales.  Comfortable work of breathing. Good air movement. Cardiac: Regular rate and rhythm. Normal S1/S2.  No murmurs, rubs, nor gallops.   Extremities: No peripheral edema.  Strong peripheral pulses.  Mental Status: No depression, anxiety, nor agitation. Skin: Warm and dry.  Assessment & Plan: Sheketa was seen today for prediabetes.  Diagnoses and all orders for this visit:  Essential hypertension -     Nebivolol HCl (BYSTOLIC) 20 MG TABS; Two by mouth daily.  Prediabetes -     POCT HgB A1C  Mouth lesion -     Ambulatory referral to ENT   Essential hypertension: Uncontrolled chronic condition increasing Bystolic Prediabetes: E0C of 6.0, controlled, encourage her to continue focusing on reducing carbohydrates in the diet and staying as active as popular. Mouth lesion: Referral  to ear nose and throat, it's reassuring today that it doesn't have the angry appearance from when I first saw however cannot rule out a neoplastic process from my exam alone.   Return in about 3 months (around 11/28/2015) for Blood Sugar.

## 2015-09-19 ENCOUNTER — Telehealth: Payer: Self-pay | Admitting: Family Medicine

## 2015-09-19 NOTE — Telephone Encounter (Signed)
Seth Bake, Will you please let patient know that I'll add her BP values to her chart.  Unfortunately I know of no other medication to help with her blood pressure given her list of allergies and intolerances to other medications.  I think bystolic is the best option I know of and she's already on the highest dose.  The only other thing I can recommend is trying to cut back on sodium as much as possible.

## 2015-09-19 NOTE — Telephone Encounter (Signed)
Pt.notified

## 2015-10-04 ENCOUNTER — Encounter: Payer: Self-pay | Admitting: Family Medicine

## 2015-10-04 ENCOUNTER — Ambulatory Visit (INDEPENDENT_AMBULATORY_CARE_PROVIDER_SITE_OTHER): Payer: Medicare Other | Admitting: Family Medicine

## 2015-10-04 VITALS — BP 161/78 | HR 61 | Wt 194.0 lb

## 2015-10-04 DIAGNOSIS — R509 Fever, unspecified: Secondary | ICD-10-CM | POA: Diagnosis not present

## 2015-10-04 DIAGNOSIS — B009 Herpesviral infection, unspecified: Secondary | ICD-10-CM | POA: Diagnosis not present

## 2015-10-04 MED ORDER — VALACYCLOVIR HCL 500 MG PO TABS: 500.0000 mg | ORAL_TABLET | Freq: Two times a day (BID) | ORAL | Status: DC

## 2015-10-04 MED ORDER — CLOTRIMAZOLE-BETAMETHASONE 1-0.05 % EX CREA: 1.0000 "application " | TOPICAL_CREAM | Freq: Two times a day (BID) | CUTANEOUS | Status: DC

## 2015-10-04 NOTE — Progress Notes (Signed)
CC: Dana Calderon is a 74 y.o. female is here for Herpes Zoster   Subjective: HPI:  Complains of subjective fever with no objective measurements and small little red blisters forming around her vagina. Symptoms have been present for the past 2 days. She tells me it feels like prior herpes outbreaks. She denies any vaginal discharge or dysuria. She denies any other genitourinary complaints. She's been having diarrhea for the past week and this is currently being managed by her gastroenterologist. She denies any abdominal pain. She denies any skin lesions other than what's described above. She's been trying to use clobetasol but it's not helping the pain. She tells me today are seen and she would prefer not to be examined in the genital region if possible. Denies nausea, vomiting, or decreased appetite. No pulmonary complaints. Stool studies at her gastroenterologist have been unremarkable.   Review Of Systems Outlined In HPI  Past Medical History  Diagnosis Date  . Hypertension   . GERD (gastroesophageal reflux disease)   . Back pain, chronic   . Hemorrhoid   . Anxiety   . IBS (irritable bowel syndrome)   . Migraine   . Vertigo     Past Surgical History  Procedure Laterality Date  . Tonsillectomy    . Abdominal hysterectomy    . Tubal ligation    . Breast surgery    . Lumbar disc surgery    . Knee arthroscopy Left   . Hernia repair    . Mastectomy     Family History  Problem Relation Age of Onset  . Hypertension Mother   . Stroke Father     Social History   Social History  . Marital Status: Divorced    Spouse Name: N/A  . Number of Children: N/A  . Years of Education: N/A   Occupational History  . Not on file.   Social History Main Topics  . Smoking status: Never Smoker   . Smokeless tobacco: Not on file  . Alcohol Use: No  . Drug Use: No  . Sexual Activity: Not Currently   Other Topics Concern  . Not on file   Social History Narrative     Objective: BP  161/78 mmHg  Pulse 61  Wt 194 lb (87.998 kg)  Vital signs reviewed. General: Alert and Oriented, No Acute Distress HEENT: Pupils equal, round, reactive to light. Conjunctivae clear.  External ears unremarkable.  Moist mucous membranes. Lungs: Clear and comfortable work of breathing, speaking in full sentences without accessory muscle use. Cardiac: Regular rate and rhythm.  Neuro: CN II-XII grossly intact, gait normal. Extremities: No peripheral edema.  Strong peripheral pulses.  Mental Status: No depression, anxiety, nor agitation. Logical though process. Skin: Warm and dry. Assessment & Plan: Dana Calderon was seen today for herpes zoster.  Diagnoses and all orders for this visit:  HSV (herpes simplex virus) infection -     HSV(herpes simplex vrs) 1+2 ab-IgM -     valACYclovir (VALTREX) 500 MG tablet; Take 1 tablet (500 mg total) by mouth 2 (two) times daily. May refill if new outbreak occurs.  Fever, unspecified -     HSV(herpes simplex vrs) 1+2 ab-IgM  Other orders -     clotrimazole-betamethasone (LOTRISONE) cream; Apply 1 application topically 2 (two) times daily.   Without being able to see these lesions will order IgM antibodies and if elevated can safely assume that her lesions are most likely due to herpes and that her subjective fever could be secondary to  this as well. Start Valtrex pending results. She's also requested a refill on Lotrisone that she uses for candidiasis that occurs in her fat crevices, at the present time she tells me she is asymptomatic.  25 minutes spent face-to-face during visit today of which at least 50% was counseling or coordinating care regarding: 1. HSV (herpes simplex virus) infection   2. Fever, unspecified       Return if symptoms worsen or fail to improve.

## 2015-10-05 LAB — HSV(HERPES SIMPLEX VRS) I + II AB-IGM: Herpes Simplex Vrs I&II-IgM Ab (EIA): 1.16 INDEX — ABNORMAL HIGH

## 2015-10-06 ENCOUNTER — Telehealth: Payer: Self-pay | Admitting: Family Medicine

## 2015-10-06 NOTE — Telephone Encounter (Signed)
Pt advised.

## 2015-10-06 NOTE — Telephone Encounter (Signed)
Will you please let patient know that her blood work confirmed an immune response to the herpes virus, it's a good thing she started the valtrex rx from earlier this week.

## 2015-10-06 NOTE — Telephone Encounter (Signed)
Awaiting call back.

## 2015-10-24 ENCOUNTER — Encounter: Payer: Self-pay | Admitting: Family Medicine

## 2015-10-24 ENCOUNTER — Ambulatory Visit: Payer: Medicare Other | Admitting: Family Medicine

## 2015-10-24 ENCOUNTER — Ambulatory Visit (INDEPENDENT_AMBULATORY_CARE_PROVIDER_SITE_OTHER): Payer: Medicare Other | Admitting: Family Medicine

## 2015-10-24 VITALS — BP 191/83 | HR 60 | Wt 194.0 lb

## 2015-10-24 DIAGNOSIS — L309 Dermatitis, unspecified: Secondary | ICD-10-CM

## 2015-10-24 MED ORDER — NYSTATIN 100000 UNIT/GM EX POWD: CUTANEOUS | Status: DC

## 2015-10-24 NOTE — Progress Notes (Signed)
CC: Dana Calderon is a 74 y.o. female is here for Herpes Zoster and Hypertension   Subjective: HPI:  She no longer has any circular sores on her vagina since taking Valtrex. She has noticed that there is a burning sensation but feels raw between her legs posterior to the vagina and all through her gluteal cleavage. It burns when she puts on Lotrisone,it seems to get worse the more moisture is in this region. She tells me she feels no fevers chills or any signs of sickness. She denies any other genitourinary complaints or diarrhea.   She complains of eye redness in the left eye to mild degree with and present for the past 2 weeks. She denies any vision loss or ocular pain.   She complains of calf tightness in the left calf. It comes and goes on a daily basis. Nothing seems to make it better or worse but it's most noticed first thing in the morning and sometimes if she wakes up in the middle the night. She denies any other motor or sensory disturbances or overlying skin changes. She denies weakness   Review Of Systems Outlined In HPI  Past Medical History  Diagnosis Date  . Hypertension   . GERD (gastroesophageal reflux disease)   . Back pain, chronic   . Hemorrhoid   . Anxiety   . IBS (irritable bowel syndrome)   . Migraine   . Vertigo     Past Surgical History  Procedure Laterality Date  . Tonsillectomy    . Abdominal hysterectomy    . Tubal ligation    . Breast surgery    . Lumbar disc surgery    . Knee arthroscopy Left   . Hernia repair    . Mastectomy     Family History  Problem Relation Age of Onset  . Hypertension Mother   . Stroke Father     Social History   Social History  . Marital Status: Divorced    Spouse Name: N/A  . Number of Children: N/A  . Years of Education: N/A   Occupational History  . Not on file.   Social History Main Topics  . Smoking status: Never Smoker   . Smokeless tobacco: Not on file  . Alcohol Use: No  . Drug Use: No  . Sexual  Activity: Not Currently   Other Topics Concern  . Not on file   Social History Narrative     Objective: BP 191/83 mmHg  Pulse 60  Wt 194 lb (87.998 kg)  General: Alert and Oriented, No Acute Distress HEENT: Pupils equal, round, reactive to light. Conjunctivae clear.  No conjunctival erythema Lungs: clear comfortable work of breathing Cardiac: Regular rate and rhythm.  Rectal/genitourinary: Patient refused Extremities: No peripheral edema.  Strong peripheral pulses. Full range of motion and strength of both lower extremities Mental Status: No depression, anxiety, nor agitation. Skin: Warm and dry.  Assessment & Plan: Cassandr was seen today for herpes zoster and hypertension.  Diagnoses and all orders for this visit:  Dermatitis -     nystatin (MYCOSTATIN/NYSTOP) 100000 UNIT/GM POWD; Apply three times a day for up to one week after resolution of rash.   Reassurance provided that I don't see any abnormality with her eyes, what she's pointing out to is a normal capillary on the conjunctiva surface. Discussed stretches to help with the tightness of her left calf and that this does not represent anything more serious than just tight muscles. Dermatitis: I have offered to examine  the between her buttocks however she is adamant that this is not going to happen. I've let her know that I could provide her a better more firm diagnosis if I were visually able to see her complaint. Since I am not allowed to see it I will try nystatin for fungal or yeast coverage.  25 minutes spent face-to-face during visit today of which at least 50% was counseling or coordinating care regarding: 1. Dermatitis      Return if symptoms worsen or fail to improve.

## 2015-11-02 ENCOUNTER — Telehealth: Payer: Self-pay

## 2015-11-02 DIAGNOSIS — L309 Dermatitis, unspecified: Secondary | ICD-10-CM

## 2015-11-02 NOTE — Telephone Encounter (Signed)
Pt.notified

## 2015-11-02 NOTE — Telephone Encounter (Signed)
Yes, continue to use nystatin along with OTC A&D ointment.  As I mentioned during her most recent visit a referral to a dermatologist will be placed.

## 2015-11-02 NOTE — Telephone Encounter (Signed)
Mrs. Dana Calderon reports that she her Sx are worse even when using the nystatin.  Pt stated that is too busy to come in the office and wants to know do you have any other suggestions?

## 2015-11-15 ENCOUNTER — Encounter: Payer: Self-pay | Admitting: Family Medicine

## 2015-11-15 ENCOUNTER — Telehealth: Payer: Self-pay | Admitting: Family Medicine

## 2015-11-15 ENCOUNTER — Ambulatory Visit (INDEPENDENT_AMBULATORY_CARE_PROVIDER_SITE_OTHER): Payer: Medicare Other | Admitting: Family Medicine

## 2015-11-15 VITALS — BP 151/76 | HR 65 | Wt 195.0 lb

## 2015-11-15 DIAGNOSIS — R102 Pelvic and perineal pain: Secondary | ICD-10-CM | POA: Diagnosis not present

## 2015-11-15 LAB — WET PREP FOR TRICH, YEAST, CLUE
CLUE CELLS WET PREP: NONE SEEN
TRICH WET PREP: NONE SEEN
Yeast Wet Prep HPF POC: NONE SEEN

## 2015-11-15 MED ORDER — CEPHALEXIN 500 MG PO CAPS: 500.0000 mg | ORAL_CAPSULE | Freq: Three times a day (TID) | ORAL | Status: DC

## 2015-11-15 NOTE — Progress Notes (Signed)
CC: Dana Calderon is a 74 y.o. female is here for Dermatitis   Subjective: HPI:  She canceled her dermatology appointment scheduled for tomorrow because she thought it was in reference to a rash on her back that resolved with A&D ointment. This referral was actually placed for dermatitis between her legs but still bothering her. She denies any vaginal discharge but states that it feels raw around, within and behind the vagina. Extends all the way between the buttocks. She's had no benefit with topical antifungals, barrier creams, valacyclovir, nor topical steroids. She denies fevers, chills, swollen lymph nodes, night sweats or unintentional weight loss. She denies any genitourinary or gastrointestinal complaints. She has been having some left shoulder pain that she believes could be related to her rash between the legs. The pain is only worse with lifting heavy objects.    Review Of Systems Outlined In HPI  Past Medical History  Diagnosis Date  . Hypertension   . GERD (gastroesophageal reflux disease)   . Back pain, chronic   . Hemorrhoid   . Anxiety   . IBS (irritable bowel syndrome)   . Migraine   . Vertigo     Past Surgical History  Procedure Laterality Date  . Tonsillectomy    . Abdominal hysterectomy    . Tubal ligation    . Breast surgery    . Lumbar disc surgery    . Knee arthroscopy Left   . Hernia repair    . Mastectomy     Family History  Problem Relation Age of Onset  . Hypertension Mother   . Stroke Father     Social History   Social History  . Marital Status: Divorced    Spouse Name: N/A  . Number of Children: N/A  . Years of Education: N/A   Occupational History  . Not on file.   Social History Main Topics  . Smoking status: Never Smoker   . Smokeless tobacco: Not on file  . Alcohol Use: No  . Drug Use: No  . Sexual Activity: Not Currently   Other Topics Concern  . Not on file   Social History Narrative     Objective: BP 151/76 mmHg   Pulse 65  Wt 195 lb (88.451 kg)  Vital signs reviewed. General: Alert and Oriented, No Acute Distress HEENT: Pupils equal, round, reactive to light. Conjunctivae clear.  External ears unremarkable.  Moist mucous membranes. Lungs: Clear and comfortable work of breathing, speaking in full sentences without accessory muscle use. Cardiac: Regular rate and rhythm.  Neuro: CN II-XII grossly intact, gait normal. Extremities: No peripheral edema.  Strong peripheral pulses.  Mental Status: No depression, anxiety, nor agitation. Logical though process. Skin: Warm and dry. Assessment & Plan: Francesa was seen today for dermatitis.  Diagnoses and all orders for this visit:  Vaginal pain -     WET PREP FOR TRICH, YEAST, CLUE   Vaginal pain will be further evaluated with a wet prep. She'll be treated accordingly once these results are back. If results are inconclusive I will refer her to dermatology. 25 minutes spent face-to-face during visit today of which at least 50% was counseling or coordinating care regarding: 1. Vaginal pain       Return if symptoms worsen or fail to improve.

## 2015-11-15 NOTE — Telephone Encounter (Signed)
Will you please let patient know that her genital test today showed the presence of bacteria but no other infections.  Since she can tolerate keflex this seems like a reasonable treatment plan.  If not feeling better by Monday please let me know so I can arrange a dermatology referral. Rx sent to [facility].

## 2015-11-15 NOTE — Telephone Encounter (Signed)
Pt advised.

## 2015-11-28 ENCOUNTER — Ambulatory Visit: Payer: Medicare Other | Admitting: Family Medicine

## 2015-12-07 ENCOUNTER — Other Ambulatory Visit: Payer: Self-pay | Admitting: Family Medicine

## 2015-12-08 ENCOUNTER — Encounter: Payer: Self-pay | Admitting: Family Medicine

## 2015-12-08 ENCOUNTER — Ambulatory Visit (INDEPENDENT_AMBULATORY_CARE_PROVIDER_SITE_OTHER): Payer: Medicare Other | Admitting: Family Medicine

## 2015-12-08 VITALS — BP 162/85 | HR 55 | Wt 194.0 lb

## 2015-12-08 DIAGNOSIS — F419 Anxiety disorder, unspecified: Secondary | ICD-10-CM

## 2015-12-08 DIAGNOSIS — I10 Essential (primary) hypertension: Secondary | ICD-10-CM | POA: Diagnosis not present

## 2015-12-08 MED ORDER — ALPRAZOLAM 0.25 MG PO TABS: 0.2500 mg | ORAL_TABLET | Freq: Every evening | ORAL | Status: DC | PRN

## 2015-12-08 MED ORDER — ISOSORBIDE MONONITRATE ER 60 MG PO TB24: 60.0000 mg | ORAL_TABLET | Freq: Every day | ORAL | Status: DC

## 2015-12-08 MED ORDER — NEBIVOLOL HCL 20 MG PO TABS: ORAL_TABLET | ORAL | Status: DC

## 2015-12-08 NOTE — Progress Notes (Signed)
CC: Dana Calderon is a 75 y.o. female is here for Hospitalization Follow-up   Subjective: HPI:  At the end of last month she had hypertensive urgency with a headache and was taken to a local emergency room. She was given Ativan which helped lower her blood pressure. She had a MRI and MRA of the head which was unremarkable. She continues to take Bystolic as prescribed. She confirms that she is intolerant of all classes of antihypertensives other than Bystolic. She wants another significant help lower her blood pressure. Currently she denies any chest pain shortness of breath orthopnea peripheral edema nor motor or sensory disturbances.  She had a biopsy of her rash on her buttock and was reportedly dermatitis not otherwise specified. She started on clobetasol ointment yesterday. She states it itches and burns and has not gotten better or worse yet.   Review Of Systems Outlined In HPI  Past Medical History  Diagnosis Date  . Hypertension   . GERD (gastroesophageal reflux disease)   . Back pain, chronic   . Hemorrhoid   . Anxiety   . IBS (irritable bowel syndrome)   . Migraine   . Vertigo     Past Surgical History  Procedure Laterality Date  . Tonsillectomy    . Abdominal hysterectomy    . Tubal ligation    . Breast surgery    . Lumbar disc surgery    . Knee arthroscopy Left   . Hernia repair    . Mastectomy     Family History  Problem Relation Age of Onset  . Hypertension Mother   . Stroke Father     Social History   Social History  . Marital Status: Divorced    Spouse Name: N/A  . Number of Children: N/A  . Years of Education: N/A   Occupational History  . Not on file.   Social History Main Topics  . Smoking status: Never Smoker   . Smokeless tobacco: Not on file  . Alcohol Use: No  . Drug Use: No  . Sexual Activity: Not Currently   Other Topics Concern  . Not on file   Social History Narrative     Objective: BP 162/85 mmHg  Pulse 55  Wt 194 lb  (87.998 kg)  Vital signs reviewed. General: Alert and Oriented, No Acute Distress HEENT: Pupils equal, round, reactive to light. Conjunctivae clear.  External ears unremarkable.  Moist mucous membranes. Lungs: Clear and comfortable work of breathing, speaking in full sentences without accessory muscle use. Cardiac: Regular rate and rhythm.  Neuro: CN II-XII grossly intact, gait normal. Extremities: No peripheral edema.  Strong peripheral pulses.  Mental Status: No depression, anxiety, nor agitation. Logical though process. Skin: Warm and dry.  Assessment & Plan: Dana Calderon was seen today for hospitalization follow-up.  Diagnoses and all orders for this visit:  Essential hypertension -     isosorbide mononitrate (IMDUR) 60 MG 24 hr tablet; Take 1 tablet (60 mg total) by mouth daily. -     Nebivolol HCl (BYSTOLIC) 20 MG TABS; Two by mouth daily.  Anxiety  Other orders -     ALPRAZolam (XANAX) 0.25 MG tablet; Take 1 tablet (0.25 mg total) by mouth at bedtime as needed for anxiety.   reminded her today that is going to be challenging to optimize her blood pressure given her numerous intolerances to all antihypertensive classes. Discussed that isosorbide is not FDA approved for lowering blood pressure but I have seen it help with blood pressure  in some patients in the past. Continue Bystolic. She is requesting a refill on alprazolam. Her last refill was in February 2016.she is responsibly using this for anxiety.  25 minutes spent face-to-face during visit today of which at least 50% was counseling or coordinating care regarding: 1. Essential hypertension   2. Anxiety      Return in about 3 months (around 03/07/2016).

## 2015-12-11 ENCOUNTER — Telehealth: Payer: Self-pay

## 2015-12-11 MED ORDER — ISOSORBIDE MONONITRATE 10 MG PO TABS: 10.0000 mg | ORAL_TABLET | Freq: Two times a day (BID) | ORAL | Status: DC

## 2015-12-11 NOTE — Telephone Encounter (Signed)
Pt reports that Nebivolol 20 mg did help bring her BP down but its giving her the feeling of an hangover and its too costly.  Is there something cheaper?

## 2015-12-11 NOTE — Telephone Encounter (Signed)
Yes it is isosorbide.

## 2015-12-11 NOTE — Telephone Encounter (Signed)
It should be cheaper and with less side effects if we lower the dose from the extended release to 10mg  of the immediate release twice a day, I'll send in a new Rx.

## 2015-12-11 NOTE — Telephone Encounter (Signed)
Can you please ask patient to clarify, I thought she's been tolerating nebivolol for months now, is she referring to a different medication? Perhaps the isosorbide mononitrate started last week?

## 2015-12-12 NOTE — Telephone Encounter (Signed)
Pt.notified

## 2015-12-20 ENCOUNTER — Telehealth: Payer: Self-pay

## 2015-12-20 NOTE — Telephone Encounter (Signed)
Pt reports that is only taking 1/2 tablet once daily of isosorbide mononitreate.  She said when taking this medication as directed (10 mg BID) she does not like the way it makes her feel.

## 2015-12-21 ENCOUNTER — Telehealth: Payer: Self-pay

## 2015-12-21 MED ORDER — ISOSORBIDE MONONITRATE 10 MG PO TABS: 5.0000 mg | ORAL_TABLET | Freq: Two times a day (BID) | ORAL | Status: DC

## 2015-12-21 NOTE — Telephone Encounter (Signed)
That sounds ok, ill update the [facility]

## 2015-12-21 NOTE — Telephone Encounter (Signed)
Dana Calderon states she can only take 1/2 a tablet of isosorbide. If she takes a whole pill she gets headache, and feels dizzy and shaky. She said she could take 1/2 tablet twice daily. Please advise.

## 2015-12-29 LAB — HM MAMMOGRAPHY

## 2016-01-02 ENCOUNTER — Encounter: Payer: Self-pay | Admitting: Family Medicine

## 2016-01-02 ENCOUNTER — Telehealth: Payer: Self-pay

## 2016-01-02 NOTE — Telephone Encounter (Signed)
Ok try Immodium AD tonight and if diarrhea has not improved by tomorrow please schedule a f/u appointment.  Stop taking isosorbide and see if the depression and energy problem resolves.

## 2016-01-02 NOTE — Telephone Encounter (Signed)
Pt stated that Isosorbide 10 mg isn't working for her.  She feels depressed and has no energy and believes its because of this medication.  Is currently taking 1/2 tablet BID.  Do you have any suggestions?

## 2016-01-02 NOTE — Telephone Encounter (Signed)
Can she share what her blood pressure has been over the past three days? This sounds discouraging since this was the last type of bp medicine that isn't on her intolerance/allergy list.

## 2016-01-02 NOTE — Telephone Encounter (Addendum)
BP readings   Saturday: 131/72 Sunday: 150/78 Monday: 138/75 Today: 156/90  Pt also stated that she is having diarrhea and SOB.

## 2016-01-03 NOTE — Telephone Encounter (Signed)
Pt.notified

## 2016-02-11 ENCOUNTER — Encounter: Payer: Self-pay | Admitting: Emergency Medicine

## 2016-02-11 ENCOUNTER — Emergency Department
Admission: EM | Admit: 2016-02-11 | Discharge: 2016-02-11 | Disposition: A | Discharge status: Home / Self Care | Payer: Medicare Other | Source: Home / Self Care | Attending: Family Medicine | Admitting: Family Medicine

## 2016-02-11 ENCOUNTER — Emergency Department (INDEPENDENT_AMBULATORY_CARE_PROVIDER_SITE_OTHER): Payer: Medicare Other

## 2016-02-11 DIAGNOSIS — R05 Cough: Secondary | ICD-10-CM

## 2016-02-11 DIAGNOSIS — J069 Acute upper respiratory infection, unspecified: Secondary | ICD-10-CM

## 2016-02-11 DIAGNOSIS — R059 Cough, unspecified: Secondary | ICD-10-CM

## 2016-02-11 MED ORDER — BENZONATATE 100 MG PO CAPS: 100.0000 mg | ORAL_CAPSULE | Freq: Three times a day (TID) | ORAL | Status: DC

## 2016-02-11 MED ORDER — ALBUTEROL SULFATE HFA 108 (90 BASE) MCG/ACT IN AERS: 1.0000 | INHALATION_SPRAY | Freq: Four times a day (QID) | RESPIRATORY_TRACT | Status: DC | PRN

## 2016-02-11 NOTE — Discharge Instructions (Signed)
Cool Mist Vaporizers °Vaporizers may help relieve the symptoms of a cough and cold. They add moisture to the air, which helps mucus to become thinner and less sticky. This makes it easier to breathe and cough up secretions. Cool mist vaporizers do not cause serious burns like hot mist vaporizers, which may also be called steamers or humidifiers. Vaporizers have not been proven to help with colds. You should not use a vaporizer if you are allergic to mold. °HOME CARE INSTRUCTIONS °· Follow the package instructions for the vaporizer. °· Do not use anything other than distilled water in the vaporizer. °· Do not run the vaporizer all of the time. This can cause mold or bacteria to grow in the vaporizer. °· Clean the vaporizer after each time it is used. °· Clean and dry the vaporizer well before storing it. °· Stop using the vaporizer if worsening respiratory symptoms develop. °  °This information is not intended to replace advice given to you by your health care provider. Make sure you discuss any questions you have with your health care provider. °  °Document Released: 08/15/2004 Document Revised: 11/23/2013 Document Reviewed: 04/07/2013 °Elsevier Interactive Patient Education ©2016 [employer] ° °Cough, Adult °A cough helps to clear your throat and lungs. A cough may last only 2-3 weeks (acute), or it may last longer than 8 weeks (chronic). Many different things can cause a cough. A cough may be a sign of an illness or another medical condition. °HOME CARE °· Pay attention to any changes in your cough. °· Take medicines only as told by your doctor. °¨ If you were prescribed an antibiotic medicine, take it as told by your doctor. Do not stop taking it even if you start to feel better. °¨ Talk with your doctor before you try using a cough medicine. °· Drink enough fluid to keep your pee (urine) clear or pale yellow. °· If the air is dry, use a cold steam vaporizer or humidifier in your home. °· Stay away from things  that make you cough at work or at home. °· If your cough is worse at night, try using extra pillows to raise your head up higher while you sleep. °· Do not smoke, and try not to be around smoke. If you need help quitting, ask your doctor. °· Do not have caffeine. °· Do not drink alcohol. °· Rest as needed. °GET HELP IF: °· You have new problems (symptoms). °· You cough up yellow fluid (pus). °· Your cough does not get better after 2-3 weeks, or your cough gets worse. °· Medicine does not help your cough and you are not sleeping well. °· You have pain that gets worse or pain that is not helped with medicine. °· You have a fever. °· You are losing weight and you do not know why. °· You have night sweats. °GET HELP RIGHT AWAY IF: °· You cough up blood. °· You have trouble breathing. °· Your heartbeat is very fast. °  °This information is not intended to replace advice given to you by your health care provider. Make sure you discuss any questions you have with your health care provider. °  °Document Released: 08/01/2011 Document Revised: 08/09/2015 Document Reviewed: 01/25/2015 °Elsevier Interactive Patient Education ©2016 Elsevier Inc. ° °

## 2016-02-11 NOTE — ED Notes (Signed)
Pt c/o body aches, cough, sneezing, runny nose, low grade fever

## 2016-02-11 NOTE — ED Provider Notes (Signed)
CSN: [id number]     Arrival date & time 02/11/16  1400 History   First MD Initiated Contact with Patient 02/11/16 1432     Chief Complaint  Patient presents with  . URI   (Consider location/radiation/quality/duration/timing/severity/associated sxs/prior Treatment) HPI The pt is a 75yo female presenting to [facility] with c/o 3 days of body aches, mild intermittent productive cough with sneezing, rhinorrhea, and low-grade fever. She believes the body aches are from moving furniture recently.  She has been taking OTC coricidin and robitussin but has been making her heartburn worse so she stopped. Pt has multiple drug allergies. Denies n/v/d. Denies SOB.  No sick contacts or recent travel. Denies hx of asthma or COPD.   Past Medical History  Diagnosis Date  . Hypertension   . GERD (gastroesophageal reflux disease)   . Back pain, chronic   . Hemorrhoid   . Anxiety   . IBS (irritable bowel syndrome)   . Migraine   . Vertigo    Past Surgical History  Procedure Laterality Date  . Tonsillectomy    . Abdominal hysterectomy    . Tubal ligation    . Breast surgery    . Lumbar disc surgery    . Knee arthroscopy Left   . Hernia repair    . Mastectomy     Family History  Problem Relation Age of Onset  . Hypertension Mother   . Stroke Father    Social History  Substance Use Topics  . Smoking status: Never Smoker   . Smokeless tobacco: None  . Alcohol Use: No   OB History    No data available     Review of Systems  Constitutional: Positive for fever, chills and fatigue.  HENT: Positive for congestion, rhinorrhea, sinus pressure and sneezing. Negative for ear pain, sore throat, trouble swallowing and voice change.   Respiratory: Positive for cough. Negative for shortness of breath.   Cardiovascular: Positive for chest pain ( heartburn). Negative for palpitations.  Gastrointestinal: Positive for diarrhea ( chronic, from IBS). Negative for nausea, vomiting and abdominal pain.   Musculoskeletal: Positive for myalgias and arthralgias. Negative for back pain.  Skin: Negative for rash.  Neurological: Positive for headaches. Negative for dizziness and light-headedness.  All other systems reviewed and are negative.   Allergies  Cefuroxime axetil; Verelan; Zithromax; Amlodipine besylate; Atenolol; Avelox; Bentyl; Cardizem; Ciprofloxacin; Clonidine derivatives; Diovan; Doxazosin mesylate; Doxycycline; Edarbi; Erythromycin; Fioricet; Flexeril; Furosemide; Hctz; Hydralazine; Influenza vaccines; Macrobid; Methylprednisolone; Metronidazole; Naproxen; Nexium; Norflex; Paxil; Penicillins; Prednisone; Prozac; Red dye; Spironolactone; Sulfur; Tamiflu; Tizanidine; Valium; Zoloft; and Asa  Home Medications   Prior to Admission medications   Medication Sig Start Date End Date Taking? Authorizing Provider  albuterol (PROVENTIL HFA;VENTOLIN HFA) 108 (90 Base) MCG/ACT inhaler Inhale 1-2 puffs into the lungs every 6 (six) hours as needed for wheezing or shortness of breath. 02/11/16   Noland Fordyce, PA-C  ALPRAZolam Duanne Moron) 0.25 MG tablet Take 1 tablet (0.25 mg total) by mouth at bedtime as needed for anxiety. 12/08/15   Sean Hommel, DO  benzonatate (TESSALON) 100 MG capsule Take 1 capsule (100 mg total) by mouth every 8 (eight) hours. 02/11/16   Noland Fordyce, PA-C  diphenoxylate-atropine (LOMOTIL) 2.5-0.025 MG per tablet Take by mouth 4 (four) times daily as needed for diarrhea or loose stools.    Historical Provider, MD  isosorbide mononitrate (ISMO,MONOKET) 10 MG tablet Take 0.5 tablets (5 mg total) by mouth 2 (two) times daily. 12/21/15   Marcial Pacas, DO  meclizine (ANTIVERT) 25  MG tablet Take 25 mg by mouth 3 (three) times daily as needed for dizziness.    Historical Provider, MD  Multiple Vitamin (MULTIVITAMIN) tablet Take 1 tablet by mouth daily.    Historical Provider, MD  Nebivolol HCl (BYSTOLIC) 20 MG TABS Two by mouth daily. 12/08/15   Sean Hommel, DO  omeprazole (PRILOSEC) 20 MG  capsule TAKE ONE CAPSULE BY MOUTH EVERY DAY 12/07/15   Marcial Pacas, DO  primidone (MYSOLINE) 50 MG tablet Take by mouth 4 (four) times daily.    Historical Provider, MD  UNKNOWN TO PATIENT     Historical Provider, MD  valACYclovir (VALTREX) 500 MG tablet Take 1 tablet (500 mg total) by mouth 2 (two) times daily. May refill if new outbreak occurs. 10/04/15   Marcial Pacas, DO   Meds Ordered and Administered this Visit  Medications - No data to display  BP 159/84 mmHg  Pulse 75  Temp(Src) 99.5 F (37.5 C) (Oral)  Ht 5\' 6"  (1.676 m)  Wt 195 lb (88.451 kg)  BMI 31.49 kg/m2  SpO2 96% No data found.   Physical Exam  Constitutional: She appears well-developed and well-nourished. No distress.  HENT:  Head: Normocephalic and atraumatic.  Right Ear: Tympanic membrane normal.  Left Ear: Tympanic membrane normal.  Nose: Rhinorrhea present.  Mouth/Throat: Uvula is midline, oropharynx is clear and moist and mucous membranes are normal.  Eyes: Conjunctivae are normal. No scleral icterus.  Neck: Normal range of motion. Neck supple.  Cardiovascular: Normal rate, regular rhythm and normal heart sounds.   Pulmonary/Chest: Effort normal. No respiratory distress. She has wheezes. She has no rales. She exhibits no tenderness.  Faint expiratory wheeze. No respiratory distress.   Abdominal: Soft. She exhibits no distension. There is no tenderness.  Musculoskeletal: Normal range of motion.  Neurological: She is alert.  Skin: Skin is warm and dry. She is not diaphoretic.  Nursing note and vitals reviewed.   ED Course  Procedures (including critical care time)  Labs Review Labs Reviewed - No data to display  Imaging Review Dg Chest 2 View  02/11/2016  CLINICAL DATA:  Body aches since last night with cough and sneezing. EXAM: CHEST  2 VIEW COMPARISON:  None. FINDINGS: The lungs are clear wiithout focal pneumonia, edema, pneumothorax or pleural effusion. The cardiopericardial silhouette is within normal  limits for size. The visualized bony structures of the thorax are intact. IMPRESSION: No active cardiopulmonary disease. Electronically Signed   By: Misty Stanley M.D.   On: 02/11/2016 15:20      MDM   1. Cough   2. Acute upper respiratory infection    Pt c/o URI symptoms for 3 days.   No respiratory distress.  Lungs: faint expiratory wheeze.  CXR: no evidence of pneumonia.  Rx: albuterol inhaler and tessalon  Encouraged f/u with PCP for recheck of symptoms next week if not improving. Patient verbalized understanding and agreement with treatment plan.     Noland Fordyce, PA-C 02/11/16 1601

## 2016-02-12 ENCOUNTER — Encounter: Payer: Self-pay | Admitting: Family Medicine

## 2016-02-12 ENCOUNTER — Ambulatory Visit (INDEPENDENT_AMBULATORY_CARE_PROVIDER_SITE_OTHER): Payer: Medicare Other | Admitting: Family Medicine

## 2016-02-12 VITALS — BP 145/83 | HR 73 | Temp 101.5°F | Wt 195.0 lb

## 2016-02-12 DIAGNOSIS — K209 Esophagitis, unspecified without bleeding: Secondary | ICD-10-CM

## 2016-02-12 DIAGNOSIS — R509 Fever, unspecified: Secondary | ICD-10-CM

## 2016-02-12 MED ORDER — AMBULATORY NON FORMULARY MEDICATION: Status: DC

## 2016-02-12 MED ORDER — CEFDINIR 300 MG PO CAPS: 300.0000 mg | ORAL_CAPSULE | Freq: Two times a day (BID) | ORAL | Status: AC

## 2016-02-12 NOTE — Progress Notes (Signed)
CC: Dana Calderon is a 75 y.o. female is here for Cough and Fever   Subjective: HPI:  Ever since Thursday she's been coughing and sneezing. Today she felt feverish, no temperatures from home. She scrubs her cough is nonproductive, mild shortness of breath even at rest. Denies any chest pain other than a burning sensation if she swallows anything other than water. This pain starts in the back of the neck and travels all the way down to the epigastric region. She denies any exertional component or breathing component to her pain. She is doubled up on omeprazole but has not helped. She denies any nausea or vomiting. Albuterol was not beneficial and she is scared to take Tessalon Perles prescribed to her yesterday at urgent care. She denies any wheezing, confusion, facial pain, or headache.    Review Of Systems Outlined In HPI  Past Medical History  Diagnosis Date  . Hypertension   . GERD (gastroesophageal reflux disease)   . Back pain, chronic   . Hemorrhoid   . Anxiety   . IBS (irritable bowel syndrome)   . Migraine   . Vertigo     Past Surgical History  Procedure Laterality Date  . Tonsillectomy    . Abdominal hysterectomy    . Tubal ligation    . Breast surgery    . Lumbar disc surgery    . Knee arthroscopy Left   . Hernia repair    . Mastectomy     Family History  Problem Relation Age of Onset  . Hypertension Mother   . Stroke Father     Social History   Social History  . Marital Status: Divorced    Spouse Name: N/A  . Number of Children: N/A  . Years of Education: N/A   Occupational History  . Not on file.   Social History Main Topics  . Smoking status: Never Smoker   . Smokeless tobacco: Not on file  . Alcohol Use: No  . Drug Use: No  . Sexual Activity: Not Currently   Other Topics Concern  . Not on file   Social History Narrative     Objective: BP 145/83 mmHg  Pulse 73  Temp(Src) 101.5 F (38.6 C) (Oral)  Wt 195 lb (88.451 kg)  General: Alert  and Oriented, No Acute Distress HEENT: Pupils equal, round, reactive to light. Conjunctivae clear.  External ears unremarkable, canals clear with intact TMs with appropriate landmarks.  Middle ear appears open without effusion. Pink inferior turbinates.  Moist mucous membranes, pharynx without inflammation nor lesions.  Neck supple without palpable lymphadenopathy nor abnormal masses. Lungs: Clear to auscultation bilaterally, no wheezing/ronchi/rales.  Comfortable work of breathing. Good air movement. Cardiac: Regular rate and rhythm. Normal S1/S2.  No murmurs, rubs, nor gallops.   Extremities: No peripheral edema.  Strong peripheral pulses.  Mental Status: No depression, anxiety, nor agitation. Skin: Warm and dry.  Assessment & Plan: Mar was seen today for cough and fever.  Diagnoses and all orders for this visit:  Fever, unspecified fever cause  Acute esophagitis  Other orders -     cefdinir (OMNICEF) 300 MG capsule; Take 1 capsule (300 mg total) by mouth 2 (two) times daily. -     AMBULATORY NON FORMULARY MEDICATION; Viscous Lidocaine 2% (130mL), Maalox (167mL), Donnatal (90mL). Take 18mL by mouth up to four times a day as needed for esophagus pain.   Esophagitis: Seems reasonable to continue on double dose omeprazole however start GI cocktail on an as-needed basis.  Fever: X-ray yesterday rules out pneumonia like her start on Omnicef to help cover any bacterial bronchitis or sinus infection.,  Signs and symptoms requring emergent/urgent reevaluation were discussed with the patient.  Return if symptoms worsen or fail to improve.

## 2016-02-22 ENCOUNTER — Ambulatory Visit (INDEPENDENT_AMBULATORY_CARE_PROVIDER_SITE_OTHER): Payer: Medicare Other | Admitting: Family Medicine

## 2016-02-22 ENCOUNTER — Encounter: Payer: Self-pay | Admitting: Family Medicine

## 2016-02-22 ENCOUNTER — Telehealth: Payer: Self-pay | Admitting: Family Medicine

## 2016-02-22 VITALS — BP 181/92 | HR 72 | Temp 99.3°F | Wt 197.0 lb

## 2016-02-22 DIAGNOSIS — R05 Cough: Secondary | ICD-10-CM | POA: Diagnosis not present

## 2016-02-22 DIAGNOSIS — E871 Hypo-osmolality and hyponatremia: Secondary | ICD-10-CM | POA: Diagnosis not present

## 2016-02-22 DIAGNOSIS — I10 Essential (primary) hypertension: Secondary | ICD-10-CM | POA: Diagnosis not present

## 2016-02-22 DIAGNOSIS — R059 Cough, unspecified: Secondary | ICD-10-CM | POA: Insufficient documentation

## 2016-02-22 MED ORDER — HYDROCODONE-HOMATROPINE 5-1.5 MG/5ML PO SYRP: 5.0000 mL | ORAL_SOLUTION | Freq: Three times a day (TID) | ORAL | Status: DC | PRN

## 2016-02-22 NOTE — Patient Instructions (Signed)
Thank you for coming in today. Get labs.  Take cough medicine.  Follow up with Dr. Ileene Rubens next week.  Call or go to the emergency room if you get worse, have trouble breathing, have chest pains, or palpitations.

## 2016-02-22 NOTE — Telephone Encounter (Signed)
Pt.notified

## 2016-02-22 NOTE — Telephone Encounter (Signed)
After hours line contacted that she's still experiencing an annoying cough, offering hycodan. [place], Rx placed in in-box ready for pickup/faxing.

## 2016-02-23 LAB — CBC
HCT: 41.4 % (ref 36.0–46.0)
Hemoglobin: 13.6 g/dL (ref 12.0–15.0)
MCH: 28.7 pg (ref 26.0–34.0)
MCHC: 32.9 g/dL (ref 30.0–36.0)
MCV: 87.3 fL (ref 78.0–100.0)
MPV: 9.9 fL (ref 8.6–12.4)
PLATELETS: 292 10*3/uL (ref 150–400)
RBC: 4.74 MIL/uL (ref 3.87–5.11)
RDW: 14.3 % (ref 11.5–15.5)
WBC: 12.2 10*3/uL — ABNORMAL HIGH (ref 4.0–10.5)

## 2016-02-23 LAB — COMPREHENSIVE METABOLIC PANEL
ALT: 17 U/L (ref 6–29)
AST: 19 U/L (ref 10–35)
Albumin: 4 g/dL (ref 3.6–5.1)
Alkaline Phosphatase: 75 U/L (ref 33–130)
BUN: 20 mg/dL (ref 7–25)
CHLORIDE: 99 mmol/L (ref 98–110)
CO2: 29 mmol/L (ref 20–31)
Calcium: 9.1 mg/dL (ref 8.6–10.4)
Creat: 0.79 mg/dL (ref 0.60–0.93)
Glucose, Bld: 118 mg/dL — ABNORMAL HIGH (ref 65–99)
POTASSIUM: 4.5 mmol/L (ref 3.5–5.3)
SODIUM: 138 mmol/L (ref 135–146)
TOTAL PROTEIN: 6.9 g/dL (ref 6.1–8.1)
Total Bilirubin: 0.3 mg/dL (ref 0.2–1.2)

## 2016-02-23 NOTE — Progress Notes (Signed)
Quick Note:  Labs are largely normal. White blood cell count very mildly elevated. Follow-up with Dr. Ileene Rubens next week. Return sooner if worsening. ______

## 2016-02-23 NOTE — Assessment & Plan Note (Signed)
Blood pressure elevated. Follow-up with PCP next week.

## 2016-02-23 NOTE — Assessment & Plan Note (Signed)
Normalize. Follow-up with PCP next week.

## 2016-02-23 NOTE — Progress Notes (Signed)
Dana Calderon is a 75 y.o. female who presents to [facility] today for hospital follow-up and cough. Patient was hospitalized last week for type A influenza. Her hospital course was complicated by hyponatremia. She had x-rays and CT scans that showed atelectasis but no pneumonia. She was discharged with Tamiflu. In the interim she's noted a bothersome cough. She denies any wheezing fevers chills vomiting or diarrhea. She's tried some over-the-counter medicines for cough suppression have helped. Over all she feels better than she did when she was hospitalized.   Past Medical History  Diagnosis Date  . Hypertension   . GERD (gastroesophageal reflux disease)   . Back pain, chronic   . Hemorrhoid   . Anxiety   . IBS (irritable bowel syndrome)   . Migraine   . Vertigo    Past Surgical History  Procedure Laterality Date  . Tonsillectomy    . Abdominal hysterectomy    . Tubal ligation    . Breast surgery    . Lumbar disc surgery    . Knee arthroscopy Left   . Hernia repair    . Mastectomy     Social History  Substance Use Topics  . Smoking status: Never Smoker   . Smokeless tobacco: Not on file  . Alcohol Use: No   family history includes Hypertension in her mother; Stroke in her father.  ROS as above Medications: Current Outpatient Prescriptions  Medication Sig Dispense Refill  . albuterol (PROVENTIL HFA;VENTOLIN HFA) 108 (90 Base) MCG/ACT inhaler Inhale 1-2 puffs into the lungs every 6 (six) hours as needed for wheezing or shortness of breath. 1 Inhaler 0  . ALPRAZolam (XANAX) 0.25 MG tablet Take 1 tablet (0.25 mg total) by mouth at bedtime as needed for anxiety. 30 tablet 1  . AMBULATORY NON FORMULARY MEDICATION Viscous Lidocaine 2% (172mL), Maalox (130mL), Donnatal (12mL). Take 16mL by mouth up to four times a day as needed for esophagus pain. 250 mL 2  . benzonatate  (TESSALON) 100 MG capsule Take 1 capsule (100 mg total) by mouth every 8 (eight) hours. 21 capsule 0  . diphenoxylate-atropine (LOMOTIL) 2.5-0.025 MG per tablet Take by mouth 4 (four) times daily as needed for diarrhea or loose stools.    Marland Kitchen HYDROcodone-homatropine (HYCODAN) 5-1.5 MG/5ML syrup Take 5 mLs by mouth every 8 (eight) hours as needed for cough. 120 mL 0  . isosorbide mononitrate (ISMO,MONOKET) 10 MG tablet Take 0.5 tablets (5 mg total) by mouth 2 (two) times daily. 60 tablet 1  . meclizine (ANTIVERT) 25 MG tablet Take 25 mg by mouth 3 (three) times daily as needed for dizziness.    . Multiple Vitamin (MULTIVITAMIN) tablet Take 1 tablet by mouth daily.    . Nebivolol HCl (BYSTOLIC) 20 MG TABS Two by mouth daily. 60 tablet 3  . omeprazole (PRILOSEC) 20 MG capsule TAKE ONE CAPSULE BY MOUTH EVERY DAY 90 capsule 0  . primidone (MYSOLINE) 50 MG tablet Take by mouth 4 (four) times daily.    Marland Kitchen UNKNOWN TO PATIENT     . valACYclovir (VALTREX) 500 MG tablet Take 1 tablet (500 mg total) by mouth 2 (two) times daily. May refill if new outbreak occurs. 6 tablet 4   No current facility-administered medications for this visit.   Allergies  Allergen Reactions  . Cefuroxime Axetil Shortness Of Breath  . Verelan [Verapamil Hcl Er] Shortness Of Breath  . Zithromax [Azithromycin] Shortness Of Breath  . Amlodipine Besylate   .  Atenolol   . Avelox [Moxifloxacin]   . Bentyl [Dicyclomine Hcl]   . Cardizem [Diltiazem Hcl]   . Ciprofloxacin   . Clonidine Derivatives   . Diovan [Valsartan]   . Doxazosin Mesylate   . Doxycycline   . Edarbi [Azilsartan]     Heart pounding, foot swelling  . Erythromycin   . Fioricet [Butalbital-Apap-Caffeine]   . Flexeril [Cyclobenzaprine]   . Furosemide   . Hctz [Hydrochlorothiazide]   . Hydralazine   . Influenza Vaccines     "head in a vice", nasal itching  . Macrobid [employer] Macro]   . Methylprednisolone Hypertension  . Metronidazole   .  Naproxen   . Nexium [Esomeprazole Magnesium]   . Norflex [Orphenadrine Citrate]   . Paxil [Paroxetine Hcl]   . Penicillins   . Prednisone   . Prozac [Fluoxetine Hcl]   . Red Dye   . Spironolactone   . Sulfur   . Tamiflu [Oseltamivir Phosphate] Diarrhea  . Tizanidine   . Valium [Diazepam]     "Too strong?"  . Zoloft [Sertraline Hcl]   . Asa [Aspirin] Rash     Exam:  BP 181/92 mmHg  Pulse 72  Temp(Src) 99.3 F (37.4 C) (Oral)  Wt 197 lb (89.359 kg)  SpO2 95% Gen: Well NAD HEENT: EOMI,  MMM Lungs: Normal work of breathing. CTABL Heart: RRR no MRG Abd: NABS, Soft. Nondistended, Nontender Exts: Brisk capillary refill, warm and well perfused.   Results for orders placed or performed in visit on 02/22/16 (from the past 24 hour(s))  CBC     Status: Abnormal   Collection Time: 02/22/16  3:17 PM  Result Value Ref Range   WBC 12.2 (H) 4.0 - 10.5 K/uL   RBC 4.74 3.87 - 5.11 MIL/uL   Hemoglobin 13.6 12.0 - 15.0 g/dL   HCT 41.4 36.0 - 46.0 %   MCV 87.3 78.0 - 100.0 fL   MCH 28.7 26.0 - 34.0 pg   MCHC 32.9 30.0 - 36.0 g/dL   RDW 14.3 11.5 - 15.5 %   Platelets 292 150 - 400 K/uL   MPV 9.9 8.6 - 12.4 fL   Narrative   Performed at:  [facility], [id number]                [place], [place] [address] HAS THE PATIENT FASTED?->NO; SOURCE: BLD&BLOOD; HAS THE PATI  Comprehensive metabolic panel     Status: Abnormal   Collection Time: 02/22/16  3:17 PM  Result Value Ref Range   Sodium 138 135 - 146 mmol/L   Potassium 4.5 3.5 - 5.3 mmol/L   Chloride 99 98 - 110 mmol/L   CO2 29 20 - 31 mmol/L   Glucose, Bld 118 (H) 65 - 99 mg/dL   BUN 20 7 - 25 mg/dL   Creat 0.79 0.60 - 0.93 mg/dL   Total Bilirubin 0.3 0.2 - 1.2 mg/dL   Alkaline Phosphatase 75 33 - 130 U/L   AST 19 10 - 35 U/L   ALT 17 6 - 29 U/L   Total Protein 6.9 6.1 - 8.1 g/dL   Albumin 4.0 3.6 - 5.1 g/dL   Calcium 9.1 8.6 - 10.4 mg/dL   Narrative   Performed at:  [id number]  [place], [place] [address]   No results found.   Please see individual assessment and plan sections.

## 2016-02-23 NOTE — Assessment & Plan Note (Addendum)
Likely postviral. Follow-up with PCP next week. Use Hycodan cough syrup.

## 2016-02-26 ENCOUNTER — Other Ambulatory Visit: Payer: Self-pay | Admitting: Family Medicine

## 2016-02-28 ENCOUNTER — Ambulatory Visit (INDEPENDENT_AMBULATORY_CARE_PROVIDER_SITE_OTHER): Payer: Medicare Other | Admitting: Family Medicine

## 2016-02-28 ENCOUNTER — Encounter: Payer: Self-pay | Admitting: Family Medicine

## 2016-02-28 VITALS — BP 161/85 | HR 64 | Wt 197.0 lb

## 2016-02-28 DIAGNOSIS — R5382 Chronic fatigue, unspecified: Secondary | ICD-10-CM

## 2016-02-28 DIAGNOSIS — R7303 Prediabetes: Secondary | ICD-10-CM | POA: Diagnosis not present

## 2016-02-28 DIAGNOSIS — M25512 Pain in left shoulder: Secondary | ICD-10-CM

## 2016-02-28 DIAGNOSIS — I10 Essential (primary) hypertension: Secondary | ICD-10-CM | POA: Diagnosis not present

## 2016-02-28 NOTE — Progress Notes (Signed)
CC: Dana Calderon is a 75 y.o. female is here for Cough; Shortness of Breath; Fatigue; and Shoulder Pain   Subjective: HPI:  Follow-up HYPERTENSION: TAKING BYSTOLIC and isosorbide mononitrate with 100% compliance. Blood pressures ranging from normotensive to stage I hypertension at home. There is a direct relationship between her level of heightened anxiety and her blood pressure. Blood pressure seems to be worse after having to argue with her neighbors or putting up with loud music playED by one of her neighbors. There's been no chest pain peripheral edema nor motor or sensory disturbances but she still has a lingering cough and shortness of breath that is slowly improving ever since she was discharged from the hospital.  Follow-up prediabetes: Currently not on any particular diet. No outside blood sugars to report. Denies polyuria or polyphagia or polydipsia.  She tells me for months now she's felt fatigued and was worsened after she had the flu and had to be hospitalized. She tells me   she suffered from fatigue on a daily basis and having difficulty with sleeping due to loud music played by her neighbors. She is worried there could be something wrong other than just her environmental situation that's causing sleeplessness. She denies focal weakness. She does have some left shoulder pain has been present ever since she was moving furniture a few weeks ago. It is localized on the anterior aspect of the shoulder and worse with flexion of the elbow. Ethanol seems to make it better or worse and is mild in severity but persistent  Review of systems Outlined In HPI  Past Medical History  Diagnosis Date  . Hypertension   . GERD (gastroesophageal reflux disease)   . Back pain, chronic   . Hemorrhoid   . Anxiety   . IBS (irritable bowel syndrome)   . Migraine   . Vertigo     Past Surgical History  Procedure Laterality Date  . Tonsillectomy    . Abdominal hysterectomy    . Tubal ligation    .  Breast surgery    . Lumbar disc surgery    . Knee arthroscopy Left   . Hernia repair    . Mastectomy     Family History  Problem Relation Age of Onset  . Hypertension Mother   . Stroke Father     Social History   Social History  . Marital Status: Divorced    Spouse Name: N/A  . Number of Children: N/A  . Years of Education: N/A   Occupational History  . Not on file.   Social History Main Topics  . Smoking status: Never Smoker   . Smokeless tobacco: Not on file  . Alcohol Use: No  . Drug Use: No  . Sexual Activity: Not Currently   Other Topics Concern  . Not on file   Social History Narrative     Objective: BP 161/85 mmHg  Pulse 64  Wt 197 lb (89.359 kg)  SpO2 94%  Vital signs reviewed. General: Alert and Oriented, No Acute Distress HEENT: Pupils equal, round, reactive to light. Conjunctivae clear.  External ears unremarkable.  Moist mucous membranes. Lungs: Clear and comfortable work of breathing, speaking in full sentences without accessory muscle use. Cardiac: Regular rate and rhythm.  Neuro: CN II-XII grossly intact, gait normal. Extremities: No peripheral edema.  Strong peripheral pulses.  Left shoulder exam reveals full range of motion and strength in all planes of motion and with individual rotator cuff testing. No overlying redness warmth or swelling.  Neer's test negative.  Crossarm test negative. O'Brien's test negative. Speeds test positive Apprehension test negative.  Mental Status: No depression, anxiety, nor agitation. Logical though process. Skin: Warm and dry. Assessment & Plan: Katielynn was seen today for cough, shortness of breath, fatigue and shoulder pain.  Diagnoses and all orders for this visit:  Essential hypertension  Prediabetes -     Hemoglobin A1c  Chronic fatigue -     VITAMIN D 25 Hydroxy (Vit-D Deficiency, Fractures)  Left shoulder pain -     Ambulatory referral to [place] hypertension: Uncontrolled chronic  condition, unfortunately she has an intolerance to all other classes of antihypertensives and I'm out of ideas other than Bystolic, isosorbide and trying to reduce her stress level. She has an appointment with her therapist tomorrow which I think will be helpful. Prediabetes: Clinical controlled but due for an A1c Chronic fatigue: Working up possible vitamin D deficiency Home health has been ordered to help with her fatigue and also with left shoulder pain which is suspect is due to bicipital tendinitis.   Return if symptoms worsen or fail to improve.

## 2016-02-29 ENCOUNTER — Telehealth: Payer: Self-pay | Admitting: Family Medicine

## 2016-02-29 LAB — HEMOGLOBIN A1C
Hgb A1c MFr Bld: 5.9 % — ABNORMAL HIGH (ref <5.7)
Mean Plasma Glucose: 123 mg/dL

## 2016-02-29 LAB — VITAMIN D 25 HYDROXY (VIT D DEFICIENCY, FRACTURES): Vit D, 25-Hydroxy: 32 ng/mL (ref 30–100)

## 2016-02-29 MED ORDER — VITAMIN D (ERGOCALCIFEROL) 1.25 MG (50000 UNIT) PO CAPS: 50000.0000 [IU] | ORAL_CAPSULE | ORAL | Status: DC

## 2016-02-29 NOTE — Telephone Encounter (Signed)
Will you please let patient know that her vitamin d level was borderline deficient and could be contributing to her fatigue. I'd recommend she start a weekly vitamin d supplement that i've sent to [facility].  Also her a1c was stable and remains in the prediabetic range.

## 2016-02-29 NOTE — Telephone Encounter (Signed)
Pt.notified

## 2016-03-30 ENCOUNTER — Other Ambulatory Visit: Payer: Self-pay | Admitting: Family Medicine

## 2016-04-17 ENCOUNTER — Encounter: Payer: Self-pay | Admitting: Family Medicine

## 2016-04-17 ENCOUNTER — Ambulatory Visit (INDEPENDENT_AMBULATORY_CARE_PROVIDER_SITE_OTHER): Payer: Medicare Other | Admitting: Family Medicine

## 2016-04-17 VITALS — BP 172/101 | HR 67 | Wt 202.0 lb

## 2016-04-17 DIAGNOSIS — I10 Essential (primary) hypertension: Secondary | ICD-10-CM | POA: Diagnosis not present

## 2016-04-17 DIAGNOSIS — G4700 Insomnia, unspecified: Secondary | ICD-10-CM

## 2016-04-17 MED ORDER — HYDROXYZINE HCL 50 MG PO TABS: 50.0000 mg | ORAL_TABLET | Freq: Every day | ORAL | Status: DC | PRN

## 2016-04-17 NOTE — Progress Notes (Signed)
CC: Dana Calderon is a 75 y.o. female is here for Hypertension; Headache; and Leg Swelling   Subjective: HPI:  Follow-up essential hypertension: She was seen at a local emergency room a few weeks ago and started on lisinopril. She took the dose for 23 days but she believes it was making her migraines worse so she stopped taking this medication. She continues to take Bystolic and isosorbide mononitrate but blood pressures range anywhere from stage I to stage II hypertension. She denies chest pain or any motor or sensory disturbances other than chronic migraines.  She's having difficulty falling asleep and staying asleep for more than 2 hours a night. Symptoms are complicated By her neighbor playing loud music all hours of the day and night. She is using Benadryl and Xanax right now it only helps to a mild degree. She denies any pain or urinary habits and family for sleep.   Review Of Systems Outlined In HPI  Past Medical History  Diagnosis Date  . Hypertension   . GERD (gastroesophageal reflux disease)   . Back pain, chronic   . Hemorrhoid   . Anxiety   . IBS (irritable bowel syndrome)   . Migraine   . Vertigo     Past Surgical History  Procedure Laterality Date  . Tonsillectomy    . Abdominal hysterectomy    . Tubal ligation    . Breast surgery    . Lumbar disc surgery    . Knee arthroscopy Left   . Hernia repair    . Mastectomy     Family History  Problem Relation Age of Onset  . Hypertension Mother   . Stroke Father     Social History   Social History  . Marital Status: Divorced    Spouse Name: N/A  . Number of Children: N/A  . Years of Education: N/A   Occupational History  . Not on file.   Social History Main Topics  . Smoking status: Never Smoker   . Smokeless tobacco: Not on file  . Alcohol Use: No  . Drug Use: No  . Sexual Activity: Not Currently   Other Topics Concern  . Not on file   Social History Narrative     Objective: BP 172/101 mmHg   Pulse 67  Wt 202 lb (91.627 kg)  General: Alert and Oriented, No Acute Distress HEENT: Pupils equal, round, reactive to light. Conjunctivae clear.  Moist mucous Membranes Lungs: Clear to auscultation bilaterally, no wheezing/ronchi/rales.  Comfortable work of breathing. Good air movement. Cardiac: Regular rate and rhythm. Normal S1/S2.  No murmurs, rubs, nor gallops.   Extremities: No peripheral edema.  Strong peripheral pulses.  Mental Status: No depression, anxiety, nor agitation. Skin: Warm and dry.  Assessment & Plan: Ogechukwu was seen today for hypertension, headache and leg swelling.  Diagnoses and all orders for this visit:  Insomnia -     hydrOXYzine (ATARAX/VISTARIL) 50 MG tablet; Take 1 tablet (50 mg total) by mouth daily as needed for anxiety or itching. May cause drowsiness.  Essential hypertension   Insomnia: Encouraged her to stop taking Benadryl and instead take hydroxyzine. When first trying out  this medication do not take Xanax. Essential hypertension: Continued uncontrolled chronic condition. I let her know that I don't know of any new blood pressure medication that are outside of her intolerance and allergy list. We spent time talking about ways to reduce sodium in the diet and I focused on the importance of no longer eating food from  [employer] since this is a large part of her diet and almost everything on the menu has high levels of sodium.  25 minutes spent face-to-face during visit today of which at least 50% was counseling or coordinating care regarding: 1. Insomnia   2. Essential hypertension       No Follow-up on file.

## 2016-04-19 ENCOUNTER — Telehealth: Payer: Self-pay

## 2016-04-19 NOTE — Telephone Encounter (Signed)
Pt stated that she will not be taking hydroxyzine.  Its causing her to have nightmares. This is just an [place].

## 2016-05-11 ENCOUNTER — Other Ambulatory Visit: Payer: Self-pay | Admitting: Family Medicine

## 2016-05-13 ENCOUNTER — Other Ambulatory Visit: Payer: Self-pay | Admitting: Family Medicine

## 2016-05-20 ENCOUNTER — Emergency Department (INDEPENDENT_AMBULATORY_CARE_PROVIDER_SITE_OTHER)
Admission: EM | Admit: 2016-05-20 | Discharge: 2016-05-20 | Disposition: A | Discharge status: Home / Self Care | Payer: Medicare Other | Source: Home / Self Care | Attending: Family Medicine | Admitting: Family Medicine

## 2016-05-20 DIAGNOSIS — G441 Vascular headache, not elsewhere classified: Secondary | ICD-10-CM

## 2016-05-20 NOTE — ED Provider Notes (Signed)
CSN: [id number]     Arrival date & time 05/20/16  1657 History   First MD Initiated Contact with Patient 05/20/16 1823     Chief Complaint  Patient presents with  . Migraine    HPI Comments: Patient complains of increased frequency of right side migraine headaches during the past several months.  She notes that her headaches are preceded by an aura of "sparkles" about one hour before headache onset.  Her headaches occur about 2 to 3 times per week.  Ibuprofen is helpful.  She states that she had a severe headache about two months ago and presented to a local ER where a head CT was negative.  She complains of decreasing vision during the past two years.  She notes that she has soreness in her jaws. She states that she has an appointment with her neurologist in three days. She complains of a mild chronic rash on her scalp that she controls with ketoconazole shampoo.  Patient is a 75 y.o. female presenting with migraines. The history is provided by the patient.  Migraine This is a recurrent problem. The current episode started 2 days ago. The problem occurs constantly. The problem has been gradually improving. Associated symptoms include headaches. Nothing aggravates the symptoms. Treatments tried: ibuprofen. The treatment provided moderate relief.    Past Medical History  Diagnosis Date  . Hypertension   . GERD (gastroesophageal reflux disease)   . Back pain, chronic   . Hemorrhoid   . Anxiety   . IBS (irritable bowel syndrome)   . Migraine   . Vertigo    Past Surgical History  Procedure Laterality Date  . Tonsillectomy    . Abdominal hysterectomy    . Tubal ligation    . Breast surgery    . Lumbar disc surgery    . Knee arthroscopy Left   . Hernia repair    . Mastectomy     Family History  Problem Relation Age of Onset  . Hypertension Mother   . Stroke Father    Social History  Substance Use Topics  . Smoking status: Never Smoker   . Smokeless tobacco: None  . Alcohol  Use: No   OB History    No data available     Review of Systems  Constitutional: Positive for fatigue. Negative for fever, chills and diaphoresis.  Eyes: Negative.   Respiratory: Negative.   Cardiovascular: Negative.   Genitourinary: Negative.   Musculoskeletal: Negative.   Skin: Positive for rash.  Neurological: Positive for headaches. Negative for dizziness, syncope, facial asymmetry, speech difficulty, weakness and numbness.    Allergies  Cefuroxime axetil; Verelan; Zithromax; Amlodipine besylate; Atenolol; Avelox; Bentyl; Cardizem; Ciprofloxacin; Clonidine derivatives; Diovan; Doxazosin mesylate; Doxycycline; Edarbi; Erythromycin; Fioricet; Flexeril; Furosemide; Hctz; Hydralazine; Hydroxyzine; Influenza vaccines; Macrobid; Methylprednisolone; Metronidazole; Naproxen; Nexium; Norflex; Paxil; Penicillins; Prednisone; Prozac; Red dye; Spironolactone; Sulfur; Tamiflu; Tizanidine; Valium; Zoloft; and Asa  Home Medications   Prior to Admission medications   Medication Sig Start Date End Date Taking? Authorizing Provider  albuterol (PROVENTIL HFA;VENTOLIN HFA) 108 (90 Base) MCG/ACT inhaler Inhale 1-2 puffs into the lungs every 6 (six) hours as needed for wheezing or shortness of breath. 02/11/16   Noland Fordyce, PA-C  ALPRAZolam Duanne Moron) 0.25 MG tablet TAKE ONE TABLET BY MOUTH AT BEDTIME AS NEEDED FOR ANXIETY 05/13/16   Silverio Decamp, MD  AMBULATORY NON FORMULARY MEDICATION Viscous Lidocaine 2% (169mL), Maalox (134mL), Donnatal (61mL). Take 29mL by mouth up to four times a day as needed for esophagus  pain. 02/12/16   Sean Hommel, DO  BYSTOLIC 20 MG TABS TAKE TWO TABLETS BY MOUTH EVERY DAY 05/13/16   Marcial Pacas, DO  diphenoxylate-atropine (LOMOTIL) 2.5-0.025 MG per tablet Take by mouth 4 (four) times daily as needed for diarrhea or loose stools.    Historical Provider, MD  HYDROcodone-homatropine (HYCODAN) 5-1.5 MG/5ML syrup Take 5 mLs by mouth every 8 (eight) hours as needed for cough.  02/22/16   Sean Hommel, DO  isosorbide mononitrate (ISMO,MONOKET) 10 MG tablet Take 0.5 tablets (5 mg total) by mouth 2 (two) times daily. 12/21/15   Marcial Pacas, DO  meclizine (ANTIVERT) 25 MG tablet Take 25 mg by mouth 3 (three) times daily as needed for dizziness.    Historical Provider, MD  Multiple Vitamin (MULTIVITAMIN) tablet Take 1 tablet by mouth daily.    Historical Provider, MD  omeprazole (PRILOSEC) 20 MG capsule TAKE ONE CAPSULE BY MOUTH EVERY DAY 02/26/16   Marcial Pacas, DO  UNKNOWN TO PATIENT     Historical Provider, MD  valACYclovir (VALTREX) 500 MG tablet Take 1 tablet (500 mg total) by mouth 2 (two) times daily. May refill if new outbreak occurs. 10/04/15   Marcial Pacas, DO  Vitamin D, Ergocalciferol, (DRISDOL) 50000 units CAPS capsule Take 1 capsule (50,000 Units total) by mouth every 7 (seven) days. Recheck Vitamin D in 3 Months 02/29/16   Marcial Pacas, DO   Meds Ordered and Administered this Visit  Medications - No data to display  BP 180/85 mmHg  Pulse 70  Temp(Src) 98.1 F (36.7 C) (Oral)  Ht 5' 6.5" (1.689 m)  Wt 203 lb (92.08 kg)  BMI 32.28 kg/m2  SpO2 97% No data found.   Physical Exam Nursing notes and Vital Signs reviewed. Appearance:  Patient appears stated age, and in no acute distress.  Patient is obese (BMI 32.3) Head:  Tenderness to palpation over the right temporal artery. Eyes:  Pupils are equal, round, and reactive to light and accomodation.  Extraocular movement is intact.  Conjunctivae are not inflamed.  Fundi benign.  No photophobia.  Ears:  Canals normal.  Tympanic membranes normal.  Nose:   Normal turbinates.  No sinus tenderness.   Pharynx:  Normal Neck:  Supple.  No adenopathy.  Carotid arteries have normal uptakes.  Lungs:  Clear to auscultation.  Breath sounds are equal.  Moving air well. Heart:  Regular rate and rhythm without murmurs, rubs, or gallops.  Abdomen:  Nontender without masses or hepatosplenomegaly.  Bowel sounds are present.  No CVA  or flank tenderness.  Extremities:  No edema.  Skin:   Scattered minimal seborrheic appearing lesions scalp.  Neurologic:  Cranial nerves 2 through 12 are normal.  Patellar, achilles, and elbow reflexes are normal.  Cerebellar function is intact (finger-to-nose and rapid alternating hand movement).  Gait and station are normal.    ED Course  Procedures none  Review of previous lab results: Note elevated white blood count 12.2 on 02/22/2016, and elevated CRP 0.9 on 09/26/2015   Labs Reviewed  SEDIMENTATION RATE  C-REACTIVE PROTEIN  CBC WITH DIFFERENTIAL/PLATELET       MDM   1. Other vascular headache; ?temporal arteritis    Unable to obtain blood specimen tonight.  Return tomorrow morning well hydrated for CBC, sed rate, and C-reactive protein. Followup with neurologist as scheduled.    Kandra Nicolas, MD 05/28/16 [id number]

## 2016-05-20 NOTE — ED Notes (Signed)
Pt has been having migraines for last several months.  Has been seen in the Er.  Pt said she had a CT about 2 months ago that was negative.  She states that it is above the right eye and goes through her head out the back.  She also has some sores on the scalp that have been bothering her.

## 2016-05-20 NOTE — Discharge Instructions (Signed)
Return tomorrow Tuesday for blood testing.

## 2016-05-20 NOTE — ED Notes (Signed)
Pt uses Ketoconazole 2% shampoo for scalp Pt had 1 ibuprofen at 4 pm which helped some.  Refused one on admission.

## 2016-05-21 LAB — CBC WITH DIFFERENTIAL/PLATELET
BASOS PCT: 0 %
Basophils Absolute: 0 cells/uL (ref 0–200)
Eosinophils Absolute: 198 cells/uL (ref 15–500)
Eosinophils Relative: 2 %
HCT: 41 % (ref 35.0–45.0)
Hemoglobin: 13.3 g/dL (ref 11.7–15.5)
LYMPHS PCT: 30 %
Lymphs Abs: 2970 cells/uL (ref 850–3900)
MCH: 28.5 pg (ref 27.0–33.0)
MCHC: 32.4 g/dL (ref 32.0–36.0)
MCV: 88 fL (ref 80.0–100.0)
MONOS PCT: 7 %
MPV: 10.7 fL (ref 7.5–12.5)
Monocytes Absolute: 693 cells/uL (ref 200–950)
Neutro Abs: 6039 cells/uL (ref 1500–7800)
Neutrophils Relative %: 61 %
PLATELETS: 245 10*3/uL (ref 140–400)
RBC: 4.66 MIL/uL (ref 3.80–5.10)
RDW: 14.3 % (ref 11.0–15.0)
WBC: 9.9 10*3/uL (ref 3.8–10.8)

## 2016-05-21 LAB — C-REACTIVE PROTEIN: CRP: 0.7 mg/dL — AB (ref <0.60)

## 2016-05-21 LAB — SEDIMENTATION RATE: Sed Rate: 19 mm/hr (ref 0–30)

## 2016-05-22 ENCOUNTER — Telehealth: Payer: Self-pay | Admitting: Emergency Medicine

## 2016-05-23 ENCOUNTER — Telehealth: Payer: Self-pay | Admitting: Emergency Medicine

## 2016-05-23 NOTE — ED Notes (Signed)
Patient is better, saw neuro today, got meds from him

## 2016-06-05 ENCOUNTER — Encounter: Payer: Self-pay | Admitting: Family Medicine

## 2016-06-05 ENCOUNTER — Ambulatory Visit (INDEPENDENT_AMBULATORY_CARE_PROVIDER_SITE_OTHER): Payer: Medicare Other | Admitting: Family Medicine

## 2016-06-05 VITALS — BP 182/87 | HR 65 | Wt 200.0 lb

## 2016-06-05 DIAGNOSIS — L309 Dermatitis, unspecified: Secondary | ICD-10-CM

## 2016-06-05 MED ORDER — TRIAMCINOLONE ACETONIDE 0.1 % EX CREA: TOPICAL_CREAM | CUTANEOUS | Status: DC

## 2016-06-05 NOTE — Progress Notes (Signed)
CC: Dana Calderon is a Dana Calderon y.o. female is here for burning skin   Subjective: HPI:  1 hour ago she was changing one hour ago she was changing batteries and an appliance at home about 10 minutes after she did this she noticed some tingling on the pads of her right thumb index finger and middle finger. It is mild in severity and has not gotten better or worse since onset. She's washed her hands 3 times now. She is concerned that the battery she changed her leaking acid and it has caused her discomfort. She denies any other motor or sensory disturbances. She denies any new joint pain. She denies any overlying skin changes at the site of her sensation. It does not seem to be spreading.  She also has some dryness and flaking of her foot gets localized to the toes. It looks a little bit red but she denies any pain or itching. Interventions have included lotions but this doesn't seem to help.   Review Of Systems Outlined In HPI  Past Medical History  Diagnosis Date  . Hypertension   . GERD (gastroesophageal reflux disease)   . Back pain, chronic   . Hemorrhoid   . Anxiety   . IBS (irritable bowel syndrome)   . Migraine   . Vertigo     Past Surgical History  Procedure Laterality Date  . Tonsillectomy    . Abdominal hysterectomy    . Tubal ligation    . Breast surgery    . Lumbar disc surgery    . Knee arthroscopy Left   . Hernia repair    . Mastectomy     Family History  Problem Relation Age of Onset  . Hypertension Mother   . Stroke Father     Social History   Social History  . Marital Status: Divorced    Spouse Name: N/A  . Number of Children: N/A  . Years of Education: N/A   Occupational History  . Not on file.   Social History Main Topics  . Smoking status: Never Smoker   . Smokeless tobacco: Not on file  . Alcohol Use: No  . Drug Use: No  . Sexual Activity: Not Currently   Other Topics Concern  . Not on file   Social History Narrative     Objective: BP  182/87 mmHg  Pulse 65  Wt 200 lb (90.719 kg)  Vital signs reviewed. General: Alert and Oriented, No Acute Distress HEENT: Pupils equal, round, reactive to light. Conjunctivae clear.  External ears unremarkable.  Moist mucous membranes. Lungs: Clear and comfortable work of breathing, speaking in full sentences without accessory muscle use. Cardiac: Regular rate and rhythm.  Neuro: CN II-XII grossly intact, gait normal. Extremities: No peripheral edema.  Strong peripheral pulses.  Mental Status: No depression, anxiety, nor agitation. Logical though process. Skin: Warm and dry. Close inspection of her right hand shows no signs of ulceration or skin breakdown. The hand looks normal the skin does not look to be compromised other than some dryness involving the entire hand uniformly. Her right foot has some mild eczematous changes on the top of the toes.   Assessment & Plan: Dana Calderon was seen today for burning skin.  Diagnoses and all orders for this visit:  Dermatitis  Other orders -     triamcinolone cream (KENALOG) 0.1 %; Apply to affected areas twice a day for up to two weeks, avoid face.   Reassurance provided I don't see any signs of acid  damage to her fingers. It's possible that she did come in contact with some of this from a carotid battery however doesn't seem to be threatening her health right now. She brought in the batteries that she believes caused the problem and I don't see any compromise in their construction they do not appear to be leaking.  Dermatitis of the feet: Start triamcinolone cream for the next 2 weeks  25 minutes spent face-to-face during visit today of which at least 50% was counseling or coordinating care regarding: 1. Dermatitis       Return if symptoms worsen or fail to improve.

## 2016-06-11 ENCOUNTER — Other Ambulatory Visit: Payer: Self-pay | Admitting: Family Medicine

## 2016-06-12 ENCOUNTER — Other Ambulatory Visit: Payer: Self-pay | Admitting: Family Medicine

## 2016-06-25 ENCOUNTER — Encounter: Payer: Self-pay | Admitting: Internal Medicine

## 2016-06-26 ENCOUNTER — Telehealth: Payer: Self-pay

## 2016-06-26 ENCOUNTER — Encounter: Payer: Self-pay | Admitting: Osteopathic Medicine

## 2016-06-26 ENCOUNTER — Ambulatory Visit (INDEPENDENT_AMBULATORY_CARE_PROVIDER_SITE_OTHER): Payer: Medicare Other | Admitting: Osteopathic Medicine

## 2016-06-26 VITALS — BP 188/84 | HR 63 | Ht 66.5 in | Wt 200.0 lb

## 2016-06-26 DIAGNOSIS — R51 Headache: Secondary | ICD-10-CM

## 2016-06-26 DIAGNOSIS — G8929 Other chronic pain: Secondary | ICD-10-CM

## 2016-06-26 DIAGNOSIS — Z889 Allergy status to unspecified drugs, medicaments and biological substances status: Secondary | ICD-10-CM

## 2016-06-26 DIAGNOSIS — K649 Unspecified hemorrhoids: Secondary | ICD-10-CM

## 2016-06-26 DIAGNOSIS — I10 Essential (primary) hypertension: Secondary | ICD-10-CM | POA: Diagnosis not present

## 2016-06-26 DIAGNOSIS — Z789 Other specified health status: Secondary | ICD-10-CM

## 2016-06-26 MED ORDER — ISOSORBIDE MONONITRATE 10 MG PO TABS: 5.0000 mg | ORAL_TABLET | Freq: Two times a day (BID) | ORAL | 1 refills | Status: DC

## 2016-06-26 MED ORDER — LISINOPRIL 40 MG PO TABS: 40.0000 mg | ORAL_TABLET | Freq: Every day | ORAL | 1 refills | Status: DC

## 2016-06-26 NOTE — Progress Notes (Signed)
HPI: Dana Calderon is a 75 y.o. Not Hispanic or Latino female  who presents to [facility] today, 06/26/16,  for chief complaint of:  Chief Complaint  Patient presents with  . Medication Refill    HYPERTENSION - multiple medication intolerances. Difficulty controlling BP due to this issue. Medications reviewed as below, some difficulty elucidating what exactly the patient is taking, she does not have all pill bottles with her at this point. Requesting refill of lisinopril today.Patient states that she is not sure if she is on isosorbide mononitrate. Later in the day, patient called back from the pharmacy stating that she had an allergy to this medicine, it was removed from her list. See notes for full details.  COLD SORES - worse with stress, Thinks she may be coming down with one at the moment.Marland Kitchen   MIGRAINE - reports she was was told had temporal arteritis (no Dx this from UC). Seeing Dr Lynnette Caffey Neuro. reviewed their most recent note 06/27/2016 and doesn't appear to be any concern from their standpoint for temporal arteritis, so I think patient is under a mistaken impression/there was a miscommunication regarding this possible diagnosis.     Past medical, surgical, social and family history reviewed: Past Medical History:  Diagnosis Date  . Anxiety   . Back pain, chronic   . GERD (gastroesophageal reflux disease)   . Hemorrhoid   . Hypertension   . IBS (irritable bowel syndrome)   . Migraine   . Vertigo    Past Surgical History:  Procedure Laterality Date  . ABDOMINAL HYSTERECTOMY    . BREAST SURGERY    . HERNIA REPAIR    . KNEE ARTHROSCOPY Left   . LUMBAR DISC SURGERY    . MASTECTOMY    . TONSILLECTOMY    . TUBAL LIGATION     Social History  Substance Use Topics  . Smoking status: Never Smoker  . Smokeless tobacco: Not on file  . Alcohol use No   Family History  Problem Relation Age of Onset  . Hypertension Mother   . Stroke Father       Current medication list and allergy/intolerance information reviewed:   Current Outpatient Prescriptions  Medication Sig Dispense Refill  . ALPRAZolam (XANAX) 0.25 MG tablet TAKE ONE TABLET BY MOUTH AT BEDTIME AS NEEDED FOR ANXIETY 30 tablet 0  . BYSTOLIC 20 MG TABS TAKE TWO TABLETS BY MOUTH EVERY DAY 60 tablet 0  . diphenoxylate-atropine (LOMOTIL) 2.5-0.025 MG per tablet Take by mouth 4 (four) times daily as needed for diarrhea or loose stools.    Marland Kitchen HYDROcodone-homatropine (HYCODAN) 5-1.5 MG/5ML syrup Take 5 mLs by mouth every 8 (eight) hours as needed for cough. 120 mL 0  . isosorbide mononitrate (ISMO,MONOKET) 10 MG tablet Take 0.5 tablets (5 mg total) by mouth 2 (two) times daily. 60 tablet 1  . meclizine (ANTIVERT) 25 MG tablet Take 25 mg by mouth 3 (three) times daily as needed for dizziness.    . Multiple Vitamin (MULTIVITAMIN) tablet Take 1 tablet by mouth daily.    Marland Kitchen omeprazole (PRILOSEC) 20 MG capsule TAKE ONE CAPSULE BY MOUTH EVERY DAY 90 capsule 0  . triamcinolone cream (KENALOG) 0.1 % Apply to affected areas twice a day for up to two weeks, avoid face. 80 g 0  . UNKNOWN TO PATIENT     . valACYclovir (VALTREX) 500 MG tablet Take 1 tablet (500 mg total) by mouth 2 (two) times daily. May refill if new outbreak  occurs. 6 tablet 4   No current facility-administered medications for this visit.    Allergies  Allergen Reactions  . Cefuroxime Axetil Shortness Of Breath  . Verelan [Verapamil Hcl Er] Shortness Of Breath  . Zithromax [Azithromycin] Shortness Of Breath  . Amlodipine Besylate   . Atenolol   . Avelox [Moxifloxacin]   . Bentyl [Dicyclomine Hcl]   . Cardizem [Diltiazem Hcl]   . Ciprofloxacin   . Clonidine Derivatives   . Diovan [Valsartan]   . Doxazosin Mesylate   . Doxycycline   . Edarbi [Azilsartan]     Heart pounding, foot swelling  . Erythromycin   . Fioricet [Butalbital-Apap-Caffeine]   . Flexeril [Cyclobenzaprine]   . Furosemide   . Hctz  [Hydrochlorothiazide]   . Hydralazine   . Hydroxyzine     nightmares  . Influenza Vaccines     "head in a vice", nasal itching  . Macrobid [employer] Macro]   . Methylprednisolone Hypertension  . Metronidazole   . Naproxen   . Nexium [Esomeprazole Magnesium]   . Norflex [Orphenadrine Citrate]   . Paxil [Paroxetine Hcl]   . Penicillins   . Prednisone   . Prozac [Fluoxetine Hcl]   . Red Dye   . Spironolactone   . Sulfur   . Tamiflu [Oseltamivir Phosphate] Diarrhea  . Tizanidine   . Valium [Diazepam]     "Too strong?"  . Zoloft [Sertraline Hcl]   . Asa [Aspirin] Rash      Review of Systems:  Constitutional:  No  fever, no chills, No recent illness, No recent unintentional weight changes. No significant fatigue.   HEENT: (+) chronic headache, no vision change, no hearing change  Cardiac: No  chest pain, No  pressure, No palpitations  Respiratory:  No  shortness of breath.  Gastrointestinal: No  abdominal pain, No  nausea   Musculoskeletal: No new myalgia/arthralgia  Skin: No  Rash  Neurologic: No  weakness, No  dizziness  Exam:  BP (!) 188/84   Pulse 63   Ht 5' 6.5" (1.689 m)   Wt 200 lb (90.7 kg)   BMI 31.80 kg/m   Constitutional: VS see above. General Appearance: alert, well-developed, well-nourished, NAD  Ears, Nose, Mouth, Throat: MMM, Normal external inspection ears/nares/mouth/lips/gums.   Neck: No masses, trachea midline.   Respiratory: Normal respiratory effort. no wheeze, no rhonchi, no rales  Cardiovascular: S1/S2 normal, no murmur, no rub/gallop auscultated. RRR. No lower extremity edema.   Neurological: No cranial nerve deficit on limited exam. Motor intact and symmetric. Cerebellar reflexes grossly intact. Normal balance/coordination. No tremor.   Skin: warm, dry, intact. No rash/ulcer.  Psychiatric: Normal judgment/insight. Normal mood and affect. Oriented x3.     ASSESSMENT/PLAN:   Essential hypertension - Multiple drug  intolerances, and accurate list. Patient needs to bring all pill bottles to next visit, as well as list of allergies  - Plan: isosorbide mononitrate (ISMO,MONOKET) 10 MG tablet - Discontinued, lisinopril (PRINIVIL,ZESTRIL) 40 MG tablet, patient advised she needs to be taking the Bystolic as well.  Intolerance of drug - Will need to review the list to figure out true allergy versus intolerance, if there is some way we can work within other medicines/drug class to control BP  Hemorrhoids, unspecified hemorrhoid type - Plan: hydrocortisone (ANUSOL-HC) 25 MG suppository  Chronic nonintractable headache, unspecified headache type - Doubt temporal arteritis diagnosis, patient following with neurology     Visit summary with medication list and pertinent instructions was printed for patient to review. All questions  at time of visit were answered - patient instructed to contact office with any additional concerns. ER/RTC precautions were reviewed with the patient. Follow-up plan: Return in about 2 weeks (around 07/10/2016), or sooner if needed, for follow-up blood pressure.  Note: Total time spent 40 minutes, greater than 50% of the visit was spent face-to-face counseling and coordinating care for the following: The primary encounter diagnosis was Essential hypertension. Diagnoses of Intolerance of drug, Hemorrhoids, unspecified hemorrhoid type, and Chronic nonintractable headache, unspecified headache type were also pertinent to this visit.Marland Kitchen

## 2016-06-26 NOTE — Telephone Encounter (Signed)
Patient called stated that she is allergic to the medication Isosorbide Mononitrate 10mg  that was prescribed to her today.  She says that it makes her break out in a rash.She is requesting another medication sent to her pharmacy. Ahan Eisenberger,CMA

## 2016-06-26 NOTE — Patient Instructions (Addendum)
For high blood pressure, you should be on the following medications: Lisinopril 40 mg daily, this was refilled 06/26/2016 Isosorbide mononitrate 5 mg tablets, take one half tablet 2 times daily, this was refilled [id number] Bystolic 20 mg, 2 tablets by mouth daily  Please let us know if you are taking these medicines other than as written above! Please review the list of medications that was printed for you and let us know if we need to make any changes.  Please bring all your prescription bottles with you to next visit. Please bring her home blood pressure cuff with you to your next visit.   We will see you back in 2 weeks to follow up on blood pressure, please let us know if there is anything else we can do for you in the meantime!

## 2016-06-27 ENCOUNTER — Other Ambulatory Visit: Payer: Self-pay | Admitting: Osteopathic Medicine

## 2016-06-27 ENCOUNTER — Telehealth: Payer: Self-pay | Admitting: Osteopathic Medicine

## 2016-06-27 NOTE — Telephone Encounter (Signed)
Noted, will review neurology records, of course can't diagnose/treatment high blood pressure over the phone, patient is always encouraged to seek care here/urgent care/emergency room as needed

## 2016-06-27 NOTE — Telephone Encounter (Signed)
Patient called after hours last night for very high blood pressure, migraine, nausea, and diarrhea. Pt was advised to go to ED. Pt reports she called 911 but when the ambulance got there her "head didn't hurt as bad" so she refused transport.   Called Pt today, states her head is "better" and had her check her BP while on the phone. It was 180/100, pulse 60 while sitting with her arm hanging by her side. Pt does report she had coffee this am and took her BP Rx "about 0800." Had Pt recheck her BP with arm elevated and it was 176/93, pulse 62. Pt does have an appt with her neurologist this afternoon at 1345. Will route to PCP for review.

## 2016-06-27 NOTE — Telephone Encounter (Signed)
Patient brought in a list of her medications, I remember we made a copy, it had all the side effects that she experienced with each medicine, can we locate this to figure out what BP meds to avoid so I can find a medication might be best for her? In the meantime, can call the patient and let her know I'm going to call in something for her that I hope will work, but she needs to be taking the lisinopril and the Bystolic if nothing else, and be sure to keep her follow-up point it with me in the next 1-2 weeks. As it stands, multiple allergies in the chart are listed but without specific reactions noted. This is not appropriate chart-keeping, we need to confirm each reaction of the medications that she is intolerant.Marland KitchenMarland Kitchen

## 2016-06-28 NOTE — Telephone Encounter (Signed)
Okay, I spent some time reviewing the patient's allergy list, Dana Calderon is looking forward the copy that we made of her written allergy list. Basically, based on her allergy/intolerance list. there is essentially nothing else I can give her for blood pressure that she isn't already taking. At any rate, I think the patient probably needs to come schedule a half an hour visit with me to update all the allergies in the system for her and to discuss blood pressure medication management.

## 2016-06-28 NOTE — Telephone Encounter (Signed)
Please note Pt went to Neurologist and ED yesterday.

## 2016-07-04 ENCOUNTER — Other Ambulatory Visit: Payer: Self-pay | Admitting: Family Medicine

## 2016-07-04 ENCOUNTER — Encounter (HOSPITAL_COMMUNITY): Payer: Self-pay | Admitting: *Deleted

## 2016-07-04 ENCOUNTER — Emergency Department (HOSPITAL_COMMUNITY)
Admission: EM | Admit: 2016-07-04 | Discharge: 2016-07-04 | Disposition: A | Discharge status: Home / Self Care | Payer: Medicare Other | Attending: Emergency Medicine | Admitting: Emergency Medicine

## 2016-07-04 ENCOUNTER — Emergency Department (HOSPITAL_COMMUNITY): Payer: Medicare Other

## 2016-07-04 DIAGNOSIS — Z79899 Other long term (current) drug therapy: Secondary | ICD-10-CM | POA: Insufficient documentation

## 2016-07-04 DIAGNOSIS — R51 Headache: Secondary | ICD-10-CM | POA: Diagnosis present

## 2016-07-04 DIAGNOSIS — I10 Essential (primary) hypertension: Secondary | ICD-10-CM | POA: Diagnosis not present

## 2016-07-04 DIAGNOSIS — R519 Headache, unspecified: Secondary | ICD-10-CM

## 2016-07-04 LAB — COMPREHENSIVE METABOLIC PANEL
ALT: 16 U/L (ref 14–54)
AST: 22 U/L (ref 15–41)
Albumin: 3.6 g/dL (ref 3.5–5.0)
Alkaline Phosphatase: 66 U/L (ref 38–126)
Anion gap: 10 (ref 5–15)
BUN: 14 mg/dL (ref 6–20)
CO2: 25 mmol/L (ref 22–32)
Calcium: 9.2 mg/dL (ref 8.9–10.3)
Chloride: 104 mmol/L (ref 101–111)
Creatinine, Ser: 0.75 mg/dL (ref 0.44–1.00)
GFR calc Af Amer: >60 mL/min (ref >60)
GFR calc non Af Amer: >60 mL/min (ref >60)
Glucose, Bld: 113 mg/dL — ABNORMAL HIGH (ref 65–99)
Potassium: 3.8 mmol/L (ref 3.5–5.1)
Sodium: 139 mmol/L (ref 135–145)
Total Bilirubin: 0.2 mg/dL — ABNORMAL LOW (ref 0.3–1.2)
Total Protein: 6.6 g/dL (ref 6.5–8.1)

## 2016-07-04 LAB — CBC WITH DIFFERENTIAL/PLATELET
BASOS PCT: 0 %
Basophils Absolute: 0 10*3/uL (ref 0.0–0.1)
Eosinophils Absolute: 0.2 10*3/uL (ref 0.0–0.7)
Eosinophils Relative: 1 %
HEMATOCRIT: 41.3 % (ref 36.0–46.0)
Hemoglobin: 13.5 g/dL (ref 12.0–15.0)
Lymphocytes Relative: 23 %
Lymphs Abs: 2.7 10*3/uL (ref 0.7–4.0)
MCH: 28.7 pg (ref 26.0–34.0)
MCHC: 32.7 g/dL (ref 30.0–36.0)
MCV: 87.7 fL (ref 78.0–100.0)
MONO ABS: 0.9 10*3/uL (ref 0.1–1.0)
MONOS PCT: 7 %
NEUTROS ABS: 8.2 10*3/uL — AB (ref 1.7–7.7)
Neutrophils Relative %: 69 %
Platelets: 223 10*3/uL (ref 150–400)
RBC: 4.71 MIL/uL (ref 3.87–5.11)
RDW: 13.8 % (ref 11.5–15.5)
WBC: 12 10*3/uL — ABNORMAL HIGH (ref 4.0–10.5)

## 2016-07-04 LAB — I-STAT TROPONIN, ED: Troponin i, poc: 0.01 ng/mL (ref 0.00–0.08)

## 2016-07-04 MED ORDER — LORATADINE 10 MG PO TABS
10.0000 mg | ORAL_TABLET | Freq: Every day | ORAL | 0 refills | Status: AC
Start: 2016-07-04 — End: ?

## 2016-07-04 MED ORDER — METOCLOPRAMIDE HCL 5 MG/ML IJ SOLN
10.0000 mg | Freq: Once | INTRAMUSCULAR | Status: AC
  Administered 2016-07-04: 10 mg via INTRAVENOUS
  Filled 2016-07-04: qty 2

## 2016-07-04 MED ORDER — DIPHENHYDRAMINE HCL 50 MG/ML IJ SOLN
25.0000 mg | Freq: Once | INTRAMUSCULAR | Status: AC
  Administered 2016-07-04: 25 mg via INTRAVENOUS
  Filled 2016-07-04: qty 1

## 2016-07-04 MED ORDER — SALINE SPRAY 0.65 % NA SOLN: 1.0000 | NASAL | 0 refills | Status: AC | PRN

## 2016-07-04 NOTE — ED Triage Notes (Signed)
Pt arrives via [facility]. Pt states she was house hunting today when she had a sudden onset of pressure around her head and began feeling flushed. Pt states she went to the fire station to have her BP checked. Initial pressure was 210/92 and pt face was red. Pt denies CP, SOB, LOC, dizziness or diaphoresis. Pt is in NSR with an incomplete RBBB per EMS.

## 2016-07-04 NOTE — ED Notes (Signed)
Pt is in stable condition upon d/c and ambulates from ED. 

## 2016-07-04 NOTE — ED Provider Notes (Signed)
[facility] DEPT Provider Note   CSN: [id number] Arrival date & time: 07/04/16  1349  First Provider Contact:  None    History   Chief Complaint Chief Complaint  Patient presents with  . Hypertension    HPI Dana Calderon is a 75 y.o. female.  HPI 75 year old female with past medical history of hypertension, IBS, chronic migraines who presents with mild frontal headache. The patient states that she was out looking at houses today when she made several wrong turn to became very frustrated. She began to feel like her blood pressure was elevated. She felt flushed in the face and then began to develop a mild and now moderately severe generalized but primarily frontal headache. The headache is similar to her daily headaches, but mildly worse than baseline. She has been seen by neurology for this and is scheduled with headache clinic in the future. She states that she rested and feels slightly improved but has persistent headache. Of note, she also endorses frontal sinus fullness and pressure. She has had some nasal congestion and rhinorrhea for the last several days. Denies any fevers. She does have some postnasal drainage as well. Denies any neck pain or neck stiffness. Denies any head trauma.  Past Medical History:  Diagnosis Date  . Anxiety   . Back pain, chronic   . GERD (gastroesophageal reflux disease)   . Hemorrhoid   . Hypertension   . IBS (irritable bowel syndrome)   . Migraine   . Vertigo     Patient Active Problem List   Diagnosis Date Noted  . Cough 02/22/2016  . Hyponatremia 02/22/2016  . 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 Chest pain 06/30/2015  . Anxiety 06/30/2015  . Mouth lesion 06/30/2015  . Essential hypertension 05/30/2015  . Prediabetes 05/30/2015  . Seborrheic dermatitis 05/30/2015    Past Surgical History:  Procedure Laterality Date  . ABDOMINAL HYSTERECTOMY    . BREAST SURGERY    . HERNIA REPAIR    . KNEE ARTHROSCOPY Left   . LUMBAR  DISC SURGERY    . MASTECTOMY    . TONSILLECTOMY    . TUBAL LIGATION      OB History    No data available       Home Medications    Prior to Admission medications   Medication Sig Start Date End Date Taking? Authorizing Provider  ALPRAZolam (XANAX) 0.25 MG tablet TAKE ONE TABLET BY MOUTH AT BEDTIME AS NEEDED FOR ANXIETY Patient taking differently: TAKE 0.125 MG BY MOUTH AT BEDTIME AS NEEDED FOR ANXIETY 05/13/16  Yes Silverio Decamp, MD  BYSTOLIC 20 MG TABS TAKE TWO TABLETS BY MOUTH EVERY DAY 06/12/16  Yes Sean Hommel, DO  CRANBERRY PO Take 1 capsule by mouth daily.   Yes Historical Provider, MD  diphenoxylate-atropine (LOMOTIL) 2.5-0.025 MG per tablet Take 1 tablet by mouth 4 (four) times daily as needed for diarrhea or loose stools.    Yes Historical Provider, MD  lisinopril (PRINIVIL,ZESTRIL) 40 MG tablet Take 1 tablet (40 mg total) by mouth daily. 06/26/16  Yes Emeterio Reeve, DO  MAGNESIUM PO Take 1 tablet by mouth daily.   Yes Historical Provider, MD  Multiple Minerals-Vitamins (CALCIUM-MAGNESIUM-ZINC-D3 PO) Take 1 tablet by mouth 2 (two) times daily.   Yes Historical Provider, MD  Multiple Vitamin (MULTIVITAMIN) tablet Take 1 tablet by mouth daily.   Yes Historical Provider, MD  omeprazole (PRILOSEC) 20 MG capsule TAKE ONE CAPSULE BY MOUTH EVERY DAY Patient taking differently: TAKE 20  MG BY MOUTH EVERY DAY 07/04/16  Yes Sean Hommel, DO  OVER THE COUNTER MEDICATION Take 1 capsule by mouth daily. "Mega Red"   Yes Historical Provider, MD  Polyethylene Glycol 400 (BLINK TEARS OP) Place 1 drop into both eyes as needed (for dry eyes).   Yes Historical Provider, MD  VITAMIN E PO Take 1 capsule by mouth daily.   Yes Historical Provider, MD  hydrocortisone (ANUSOL-HC) 25 MG suppository Place 25 mg rectally 2 (two) times daily as needed for hemorrhoids or itching.    Historical Provider, MD  isosorbide mononitrate (ISMO,MONOKET) 10 MG tablet Take 0.5 tablets (5 mg total) by mouth 2  (two) times daily. Patient not taking: Reported on 07/04/2016 06/26/16   Emeterio Reeve, DO  loratadine (CLARITIN) 10 MG tablet Take 1 tablet (10 mg total) by mouth daily. 07/04/16   Duffy Bruce, MD  meclizine (ANTIVERT) 25 MG tablet Take 25 mg by mouth 3 (three) times daily as needed for dizziness.    Historical Provider, MD  sodium chloride (OCEAN) 0.65 % SOLN nasal spray Place 1 spray into both nostrils as needed for congestion. 07/04/16   Duffy Bruce, MD  triamcinolone cream (KENALOG) 0.1 % Apply to affected areas twice a day for up to two weeks, avoid face. Patient not taking: Reported on 07/04/2016 06/05/16 06/05/17  Marcial Pacas, DO  valACYclovir (VALTREX) 500 MG tablet Take 1 tablet (500 mg total) by mouth 2 (two) times daily. May refill if new outbreak occurs. Patient not taking: Reported on 07/04/2016 10/04/15   Marcial Pacas, DO    Family History Family History  Problem Relation Age of Onset  . Hypertension Mother   . Stroke Father     Social History Social History  Substance Use Topics  . Smoking status: Never Smoker  . Smokeless tobacco: Never Used  . Alcohol use No     Allergies   Cefuroxime axetil; Edarbi [azilsartan]; Methylprednisolone; Nystop [nystatin]; Penicillins; Red dye; Tamiflu [oseltamivir phosphate]; Verelan [verapamil hcl er]; Zithromax [azithromycin]; Carbidopa-levodopa; Doxazosin mesylate; Hctz [hydrochlorothiazide]; Hyoscyamine; Influenza vaccines; Other; Amlodipine besylate; Asa [aspirin]; Atenolol; Avelox [moxifloxacin]; Benicar [olmesartan]; Bentyl [dicyclomine hcl]; Benzonatate; Bisoprolol; Cardizem [diltiazem hcl]; Carvedilol; Cefdinir; Chlorthalidone; Ciprofloxacin; Citalopram; Clonidine derivatives; Diovan [valsartan]; Doxycycline; Erythromycin; Fioricet [butalbital-apap-caffeine]; Flexeril [cyclobenzaprine]; Furosemide; Gabapentin; Hydralazine; Hydroxyzine; Isosorbide; Lyrica [pregabalin]; Macrobid [nitrofurantoin monohyd macro]; Mavik [trandolapril];  Metronidazole; Naproxen; Neo-polycin hc [bacitra-neomycin-polymyxin-hc]; Nexium [esomeprazole magnesium]; Norflex [orphenadrine citrate]; Oxybutynin chloride er; Paxil [paroxetine hcl]; Phenazopyridine; Prazosin; Prednisone; Promethazine; Prozac [fluoxetine hcl]; Spironolactone; Sulfur; Tizanidine; Valium [diazepam]; and Zoloft [sertraline hcl]   Review of Systems Review of Systems  Constitutional: Negative for chills, fatigue and fever.  HENT: Negative for congestion and rhinorrhea.   Eyes: Negative for visual disturbance.  Respiratory: Negative for cough, shortness of breath and wheezing.   Cardiovascular: Negative for chest pain and leg swelling.  Gastrointestinal: Negative for abdominal pain, diarrhea, nausea and vomiting.  Genitourinary: Negative for dysuria and flank pain.  Musculoskeletal: Negative for neck pain and neck stiffness.  Skin: Negative for rash and wound.  Allergic/Immunologic: Negative for immunocompromised state.  Neurological: Positive for headaches. Negative for syncope and weakness.     Physical Exam Updated Vital Signs BP 181/80   Pulse 73   Temp 98.3 F (36.8 C) (Oral)   Resp 15   SpO2 91%   Physical Exam  Constitutional: She appears well-developed and well-nourished. No distress.  HENT:  Head: Normocephalic.  Mouth/Throat: Oropharynx is clear and moist. No oropharyngeal exudate.  Eyes: Conjunctivae are normal. Pupils are equal, round,  and reactive to light.  Neck: Normal range of motion. Neck supple.  Cardiovascular: Normal rate, regular rhythm, normal heart sounds and intact distal pulses.  Exam reveals no friction rub.   No murmur heard. Pulmonary/Chest: Effort normal and breath sounds normal. No respiratory distress. She has no wheezes. She has no rales.  Abdominal: Soft. Bowel sounds are normal. She exhibits no distension. There is no tenderness.  Musculoskeletal: She exhibits no edema.  Neurological: She is alert. She exhibits normal muscle  tone.  Skin: Skin is warm. Capillary refill takes less than 2 seconds.  Nursing note and vitals reviewed.   Neurological Exam:  Mental Status: Alert and oriented to person, place, and time. Attention and concentration normal. Speech clear. Recent memory is intact. Cranial Nerves: Visual fields intact to confrontation in all quadrants bilaterally. EOMI and PERRLA. No nystagmus noted. Facial sensation intact at forehead, maxillary cheek, and chin/mandible bilaterally. No weakness of masticatory muscles. No facial asymmetry or weakness. Hearing grossly normal to finer rub. Uvula is midline, and palate elevates symmetrically. Normal SCM and trapezius strength. Tongue midline without fasciculations Motor: Muscle strength 5/5 in proximal and distal UE and LE bilaterally. No pronator drift. Muscle tone normal. Reflexes: 2+ and symmetrical in all four extremities.  Sensation: Intact to light touch in upper and lower extremities distally bilaterally.  Gait: Normal without ataxia. Coordination: Normal FTN bilaterally.    ED Treatments / Results  Labs (all labs ordered are listed, but only abnormal results are displayed) Labs Reviewed  COMPREHENSIVE METABOLIC PANEL - Abnormal; Notable for the following:       Result Value   Glucose, Bld 113 (*)    Total Bilirubin 0.2 (*)    All other components within normal limits  CBC WITH DIFFERENTIAL/PLATELET - Abnormal; Notable for the following:    WBC 12.0 (*)    Neutro Abs 8.2 (*)    All other components within normal limits  I-STAT TROPOININ, ED    EKG  EKG Interpretation  Date/Time:  Thursday July 04 2016 14:01:08 EDT Ventricular Rate:  68 PR Interval:    QRS Duration: 136 QT Interval:  445 QTC Calculation: 474 R Axis:   -66 Text Interpretation:  Sinus rhythm Atrial premature complexes RBBB and LAFB Left ventricular hypertrophy When compared to previous: PAC are new Intraventricular conduction delay No ST elevation or depressions Confirmed  by Ellender Hose MD, Lysbeth Galas [id number]) on 07/04/2016 5:15:19 PM       Radiology Ct Head Wo Contrast  Result Date: 07/04/2016 CLINICAL DATA:  Acute onset headache in frontal region. Vertigo and hypertension. EXAM: CT HEAD WITHOUT CONTRAST TECHNIQUE: Contiguous axial images were obtained from the base of the skull through the vertex without intravenous contrast. COMPARISON:  None FINDINGS: Brain: There is age related volume loss. There is no intracranial mass, hemorrhage, extra-axial fluid collection, or midline shift. There is rather minimal small vessel disease in the centra semiovale bilaterally. Elsewhere, gray-white compartments appear normal. No acute infarct is evident. Vascular: There is no hyperdense vessel appreciable. There are scattered foci of calcification in the carotid siphon regions. Skull: The bony calvarium appears intact. Sinuses/Orbits: Orbits appear symmetric bilaterally. Patient appears to have had cataract surgery bilaterally previously. Paranasal sinuses are clear. Other: Visualized mastoid air cells are clear. IMPRESSION: Age related volume loss with minimal periventricular small vessel disease. No intracranial mass, hemorrhage, or evidence of acute infarct. Minimal carotid siphon vascular calcification. Electronically Signed   By: Lowella Grip III M.D.   On: 07/04/2016 15:54  Procedures Procedures (including critical care time)  Medications Ordered in ED Medications  metoCLOPramide (REGLAN) injection 10 mg (10 mg Intravenous Given 07/04/16 1505)  diphenhydrAMINE (BENADRYL) injection 25 mg (25 mg Intravenous Given 07/04/16 1505)     Initial Impression / Assessment and Plan / ED Course  I have reviewed the triage vital signs and the nursing notes.  Pertinent labs & imaging results that were available during my care of the patient were reviewed by me and considered in my medical decision making (see chart for details).  Clinical Course    75 year old female with past medical  history of chronic, recurrent headaches, anxiety, and IBS who presents with transient facial flushing and mild frontal headache. Symptoms feel similar to her previous headaches. No recent fevers, chills or illness. On arrival, vital signs are stable. Neurological exam is nonfocal. Suspect recurrent mild migraine versus primary tension type headache. Patient also has some sinus symptoms concerning for mild sinusitis. She has had no fever, purulent nasal discharge, and symptoms are present for less than 2 weeks and I do not feel it about x-ray indicated. Given her frontal sinus tenderness and headaches, however, with no previous CT in our imaging (although she did have a negative CT at outside hospital last week), repeat CT obtained to rule out bleed, invasive sinusitis.   CT head negative. Symptoms resolved with migraine medications. She is well-appearing. Neurological exam remains nonfocal. Screening laboratories unremarkable. BP elevated but at baseline per review of records and no signs of end-organ dysfunction at this time.  Will discharge back to home with outpatient follow-up for her hypertension and close follow-up with her neurologist.  Final Clinical Impressions(s) / ED Diagnoses   Final diagnoses:  Acute nonintractable headache, unspecified headache type    New Prescriptions Discharge Medication List as of 07/04/2016  4:07 PM    START taking these medications   Details  loratadine (CLARITIN) 10 MG tablet Take 1 tablet (10 mg total) by mouth daily., Starting Thu 07/04/2016, Print    sodium chloride (OCEAN) 0.65 % SOLN nasal spray Place 1 spray into both nostrils as needed for congestion., Starting Thu 07/04/2016, Print         Duffy Bruce, MD 07/04/16 2032

## 2016-07-08 ENCOUNTER — Ambulatory Visit (INDEPENDENT_AMBULATORY_CARE_PROVIDER_SITE_OTHER): Payer: Medicare Other | Admitting: Osteopathic Medicine

## 2016-07-08 ENCOUNTER — Encounter: Payer: Self-pay | Admitting: Osteopathic Medicine

## 2016-07-08 VITALS — BP 171/86 | HR 78 | Wt 197.0 lb

## 2016-07-08 DIAGNOSIS — K649 Unspecified hemorrhoids: Secondary | ICD-10-CM | POA: Diagnosis not present

## 2016-07-08 DIAGNOSIS — R42 Dizziness and giddiness: Secondary | ICD-10-CM

## 2016-07-08 DIAGNOSIS — I10 Essential (primary) hypertension: Secondary | ICD-10-CM | POA: Diagnosis not present

## 2016-07-08 MED ORDER — AMLODIPINE BESYLATE 2.5 MG PO TABS: 2.5000 mg | ORAL_TABLET | Freq: Every day | ORAL | 1 refills | Status: DC

## 2016-07-08 MED ORDER — MECLIZINE HCL 25 MG PO TABS: 25.0000 mg | ORAL_TABLET | Freq: Three times a day (TID) | ORAL | 0 refills | Status: DC | PRN

## 2016-07-08 MED ORDER — HYDROCORTISONE ACETATE 25 MG RE SUPP: 25.0000 mg | Freq: Two times a day (BID) | RECTAL | 6 refills | Status: DC | PRN

## 2016-07-08 NOTE — Patient Instructions (Addendum)
I'm going to carefully review year list of drug intolerances. Anything we choose to treat blood pressure, we will start at a low dose. I will call something in later today, please go to your pharmacy to pick it up tomorrow.  You can expect probably some fatigue and you may not feel quite yourself for the first few days on a new medicine, but as long as you are not having a serious reaction (like rash or full body itching, inability to breathe, serious nausea or vomiting, serious dizziness such that you feel unsteady or like your about to pass out), let's plan to continue with the medication even if you don't feel quite yourself for the first few days. If you are worried about a significant side effect or allergic reaction, please call our office so we can talk about it more.  You have never had an anaphylactic reaction or life-threatening reaction to any medication in the past, so this helps [place].

## 2016-07-08 NOTE — Progress Notes (Signed)
HPI: Dana Calderon is a 75 y.o. female  who presents to [facility] today, 07/08/16,  for chief complaint of:  Chief Complaint  Patient presents with  . Hospitalization Follow-up    Blood pressure    Hypertension: Significant difficulty finding a regimen that works with the patient, she has a great many drug intolerances on her list, most of which she cannot remember a specific reaction to. Patient has no history of anaphylactic reaction to any medication that she can recall. Patient develops full body rash with aspirin or anything with sulfa. She reports generalized weakness/mild dizziness feeling with many medications but never anything that caused her to lose balance/loss of consciousness. She is understandably very frustrated that blood pressure has not been able to get under control, this is almost certainly contributing to her headache problems as well and she is made frequent ER visits for this issue. She was instructed to bring all pill bottles to this visit but she has brought a selection of what she is taking at home not everything.     Past medical, surgical, social and family history reviewed: Past Medical History:  Diagnosis Date  . Anxiety   . Back pain, chronic   . GERD (gastroesophageal reflux disease)   . Hemorrhoid   . Hypertension   . IBS (irritable bowel syndrome)   . Migraine   . Vertigo    Past Surgical History:  Procedure Laterality Date  . ABDOMINAL HYSTERECTOMY    . BREAST SURGERY    . HERNIA REPAIR    . KNEE ARTHROSCOPY Left   . LUMBAR DISC SURGERY    . MASTECTOMY    . TONSILLECTOMY    . TUBAL LIGATION     Social History  Substance Use Topics  . Smoking status: Never Smoker  . Smokeless tobacco: Never Used  . Alcohol use No   Family History  Problem Relation Age of Onset  . Hypertension Mother   . Stroke Father      Current medication list and allergy/intolerance information reviewed:   Current Outpatient  Prescriptions  Medication Sig Dispense Refill  . ALPRAZolam (XANAX) 0.25 MG tablet TAKE ONE TABLET BY MOUTH AT BEDTIME AS NEEDED FOR ANXIETY (Patient taking differently: TAKE 0.125 MG BY MOUTH AT BEDTIME AS NEEDED FOR ANXIETY) 30 tablet 0  . BYSTOLIC 20 MG TABS TAKE TWO TABLETS BY MOUTH EVERY DAY 60 tablet 0  . CRANBERRY PO Take 1 capsule by mouth daily.    . diphenoxylate-atropine (LOMOTIL) 2.5-0.025 MG per tablet Take 1 tablet by mouth 4 (four) times daily as needed for diarrhea or loose stools.     . hydrocortisone (ANUSOL-HC) 25 MG suppository Place 25 mg rectally 2 (two) times daily as needed for hemorrhoids or itching.    . isosorbide mononitrate (ISMO,MONOKET) 10 MG tablet Take 0.5 tablets (5 mg total) by mouth 2 (two) times daily. 60 tablet 1  . lisinopril (PRINIVIL,ZESTRIL) 40 MG tablet Take 1 tablet (40 mg total) by mouth daily. 30 tablet 1  . loratadine (CLARITIN) 10 MG tablet Take 1 tablet (10 mg total) by mouth daily. 10 tablet 0  . MAGNESIUM PO Take 1 tablet by mouth daily.    . meclizine (ANTIVERT) 25 MG tablet Take 25 mg by mouth 3 (three) times daily as needed for dizziness.    . Multiple Minerals-Vitamins (CALCIUM-MAGNESIUM-ZINC-D3 PO) Take 1 tablet by mouth 2 (two) times daily.    . Multiple Vitamin (MULTIVITAMIN) tablet Take 1 tablet by  mouth daily.    Marland Kitchen omeprazole (PRILOSEC) 20 MG capsule TAKE ONE CAPSULE BY MOUTH EVERY DAY (Patient taking differently: TAKE 20 MG BY MOUTH EVERY DAY) 90 capsule 0  . OVER THE COUNTER MEDICATION Take 1 capsule by mouth daily. "Mega Red"    . Polyethylene Glycol 400 (BLINK TEARS OP) Place 1 drop into both eyes as needed (for dry eyes).    . sodium chloride (OCEAN) 0.65 % SOLN nasal spray Place 1 spray into both nostrils as needed for congestion. 15 mL 0  . triamcinolone cream (KENALOG) 0.1 % Apply to affected areas twice a day for up to two weeks, avoid face. 80 g 0  . valACYclovir (VALTREX) 500 MG tablet Take 1 tablet (500 mg total) by mouth 2  (two) times daily. May refill if new outbreak occurs. 6 tablet 4  . VITAMIN E PO Take 1 capsule by mouth daily.     No current facility-administered medications for this visit.    Allergies  Allergen Reactions  . Cefuroxime Axetil Shortness Of Breath  . Edarbi [Azilsartan] Other (See Comments)    Heart pounding, foot swelling  . Methylprednisolone Other (See Comments) and Hypertension    Raised blood pressure and sugar  . Nystop [Nystatin] Other (See Comments)    Burning skin and breathing problems  . Penicillins Other (See Comments)    Face broke out in lumps  . Red Dye Itching, Rash and Other (See Comments)    Fainting  . Tamiflu [Oseltamivir Phosphate] Diarrhea and Other (See Comments)    Fainting  . Verelan [Verapamil Hcl Er] Shortness Of Breath  . Zithromax [Azithromycin] Shortness Of Breath  . Carbidopa-Levodopa Itching and Other (See Comments)    Itchy ears, throat and tongue   . Doxazosin Mesylate Other (See Comments)    Fast pulse  . Hctz [Hydrochlorothiazide] Other (See Comments)    Cannot tolerate  . Hyoscyamine Other (See Comments)    Shakes, dizzy and headache  . Influenza Vaccines Other (See Comments)    "head in a vice", nasal itching  . Other Other (See Comments)    Cardiolyte injection - burning through body  . Amlodipine Besylate Other (See Comments)    Unknown  . Asa [Aspirin] Rash  . Atenolol Other (See Comments)    Unknown  . Avelox [Moxifloxacin] Other (See Comments)    Unknown  . Benicar [Olmesartan] Other (See Comments)    Unknown  . Bentyl [Dicyclomine Hcl] Other (See Comments)    Headache and high BP  . Benzonatate Other (See Comments)    Unknown  . Bisoprolol Other (See Comments)    Unknown  . Cardizem [Diltiazem Hcl] Other (See Comments)    Unknown  . Carvedilol Other (See Comments)    Unknown  . Cefdinir Other (See Comments)    Unknown  . Chlorthalidone Other (See Comments)    Unknown  . Ciprofloxacin Other (See Comments)     Heartburn  . Citalopram Other (See Comments)    Unknown  . Clonidine Derivatives Other (See Comments)    Unknown  . Diovan [Valsartan] Other (See Comments)    Unknown  . Doxycycline Other (See Comments)    Headache  . Erythromycin Other (See Comments)    Unknown  . Fioricet [Butalbital-Apap-Caffeine] Other (See Comments)    Headache and dizzy  . Flexeril [Cyclobenzaprine] Other (See Comments)    Unknown  . Furosemide Other (See Comments)    Unknown  . Gabapentin Other (See Comments)    Unknown  .  Hydralazine Other (See Comments)    Headache  . Hydroxyzine Other (See Comments)    nightmares  . Isosorbide Other (See Comments)    Unknown  . Lyrica [Pregabalin] Other (See Comments)    Unknown  . Macrobid [employer] Macro] Other (See Comments)    Unknown  . Mavik [Trandolapril] Other (See Comments)    Unknown  . Metronidazole Itching and Other (See Comments)    Dizzy  . Naproxen Other (See Comments)    Unknown  . Neo-Polycin Hc [Bacitra-Neomycin-Polymyxin-Hc] Itching  . Nexium [Esomeprazole Magnesium] Other (See Comments)    Headache  . Norflex [Orphenadrine Citrate] Other (See Comments)  . Oxybutynin Chloride Er Other (See Comments)    Unknown  . Paxil [Paroxetine Hcl] Other (See Comments)    Unknown  . Phenazopyridine Other (See Comments)    Unknown  . Prazosin Other (See Comments)    Unknown  . Prednisone Other (See Comments)    Unknown  . Promethazine Other (See Comments)    Unknown  . Prozac [Fluoxetine Hcl] Other (See Comments)    Unknown  . Spironolactone Other (See Comments)    Unknown  . Sulfur Other (See Comments)    Unknown  . Tizanidine Other (See Comments)    Unknown  . Valium [Diazepam] Other (See Comments)    "Too strong?"  . Zoloft [Sertraline Hcl] Other (See Comments)    Unknown      Review of Systems:  Constitutional:  No  fever, no chills, No recent illness  HEENT: (+) headache, no vision change, no hearing  change  Cardiac: No  chest pain, No  pressure, No palpitations, No  Orthopnea  Respiratory:  No  shortness of breath. No  Cough  Gastrointestinal: No  abdominal pain, No  nausea, No  vomiting,  Psychiatric: No  concerns with depression, (+) stress and concerns with anxiety, No sleep problems, No mood problems   Exam:  BP (!) 171/86   Pulse 78   Wt 197 lb (89.4 kg)   BMI 31.32 kg/m   Constitutional: VS see above. General Appearance: alert, well-developed, well-nourished, NAD  Eyes: Normal lids and conjunctive, non-icteric sclera  Ears, Nose, Mouth, Throat: MMM, Normal external inspection ears/nares/mouth/lips/gums  Neck: No masses, trachea midline. No thyroid enlargement. No tenderness/mass appreciated. No lymphadenopathy  Respiratory: Normal respiratory effort. no wheeze, no rhonchi, no rales  Cardiovascular: S1/S2 normal, no murmur, no rub/gallop auscultated. RRR. No lower extremity edema.   Psychiatric: Normal judgment/insight. Normal mood and affect. Oriented x3.    Ct Head Wo Contrast  Result Date: 07/04/2016 CLINICAL DATA:  Acute onset headache in frontal region. Vertigo and hypertension. EXAM: CT HEAD WITHOUT CONTRAST TECHNIQUE: Contiguous axial images were obtained from the base of the skull through the vertex without intravenous contrast. COMPARISON:  None FINDINGS: Brain: There is age related volume loss. There is no intracranial mass, hemorrhage, extra-axial fluid collection, or midline shift. There is rather minimal small vessel disease in the centra semiovale bilaterally. Elsewhere, gray-white compartments appear normal. No acute infarct is evident. Vascular: There is no hyperdense vessel appreciable. There are scattered foci of calcification in the carotid siphon regions. Skull: The bony calvarium appears intact. Sinuses/Orbits: Orbits appear symmetric bilaterally. Patient appears to have had cataract surgery bilaterally previously. Paranasal sinuses are clear. Other:  Visualized mastoid air cells are clear. IMPRESSION: Age related volume loss with minimal periventricular small vessel disease. No intracranial mass, hemorrhage, or evidence of acute infarct. Minimal carotid siphon vascular calcification. Electronically Signed  By: Lowella Grip III M.D.   On: 07/04/2016 15:54     ASSESSMENT/PLAN:   Long discussion with the patient about antihypertension goals. I think it's worth the risk of some mild drug intolerance to get her blood pressure to goal to reduce her headache symptoms and to reduce her overall risk of heart attack/stroke and other problems.  Discussed with patient that what I would like to do is more or less ignore her list of allergies/drug intolerances in making a decision about blood pressure medications today, since no blood pressure medicine and I give her would be an aspirin/sulfa which would cause hives reaction and she's never had an anaphylactic reaction, and despite listing of several medications is causing shortness of breath patient cannot ever recall having had this reaction. Patient is amenable to this plan as long as anything we start, we start at a very low-dose as she is very sensitive to medications, and I would agree.  Patient seems to recall most of the blood pressure medicines that she has taken in the past cause her to feel a bit "out of it" or significantly tired which hindered her ability to work but now that she is retired she thinks that she could try to "power through" some of these fatigue/drowsiness symptoms to see if blood pressure gets better and her body adjusts.  Went ahead and prescribed the lowest dose of amlodipine, patient has this on her list but she cannot recall what her reaction was to it, says this was maybe 20 years ago  I'm hopeful that once we get the blood pressure under better control, headaches will improve, if not we'll need to consider involve neurology. Patient is advised on symptoms to watch out for  scars anaphylaxis or other severe reaction to medication. She is advised that she can expect probably some fatigue symptoms, but as long she is not experiencing any significant dizziness, trouble breathing, feeling like she is about to pass out, or other concerning symptoms, she should try to continue the medication even if it makes her feel not quite herself.  I advised patient that at some point, it for not able to find a medication regimen that works for her, she will need to make a decision of her willingness to tolerate headaches and risk of chronically elevated blood pressure versus any side effects of medications which she may experience.   Patient verbalizes understanding of the above  Essential hypertension - Plan: amLODipine (NORVASC) 2.5 MG tablet  Hemorrhoids, unspecified hemorrhoid type - Plan: hydrocortisone (ANUSOL-HC) 25 MG suppository  Dizziness - Plan: meclizine (ANTIVERT) 25 MG tablet     Visit summary with medication list and pertinent instructions was printed for patient to review. All questions at time of visit were answered - patient instructed to contact office with any additional concerns. ER/RTC precautions were reviewed with the patient. Follow-up plan: Return in about 10 days (around 07/18/2016), or SOONER IF NEEDED, for BLOOD PRESSURE FOLLOW-UP.  Note: Total time spent 25 minutes, greater than 50% of the visit was spent face-to-face counseling and coordinating care for the following: The primary encounter diagnosis was Essential hypertension. Diagnoses of Hemorrhoids, unspecified hemorrhoid type and Dizziness were also pertinent to this visit.Marland Kitchen

## 2016-07-10 MED ORDER — NEBIVOLOL HCL 20 MG PO TABS: 2.0000 | ORAL_TABLET | Freq: Every day | ORAL | 0 refills | Status: DC

## 2016-07-10 NOTE — Addendum Note (Signed)
Addended by: Bo Mcclintock C on: 07/10/2016 11:10 AM   Modules accepted: Orders

## 2016-07-11 ENCOUNTER — Ambulatory Visit: Payer: Medicare Other | Admitting: Osteopathic Medicine

## 2016-07-18 ENCOUNTER — Ambulatory Visit (INDEPENDENT_AMBULATORY_CARE_PROVIDER_SITE_OTHER): Payer: Medicare Other | Admitting: Osteopathic Medicine

## 2016-07-18 ENCOUNTER — Encounter: Payer: Self-pay | Admitting: Osteopathic Medicine

## 2016-07-18 VITALS — BP 172/84 | HR 72 | Ht 66.5 in | Wt 203.0 lb

## 2016-07-18 DIAGNOSIS — I10 Essential (primary) hypertension: Secondary | ICD-10-CM | POA: Diagnosis not present

## 2016-07-18 MED ORDER — HYDROCHLOROTHIAZIDE 12.5 MG PO TABS: 12.5000 mg | ORAL_TABLET | Freq: Every day | ORAL | 0 refills | Status: DC

## 2016-07-18 NOTE — Patient Instructions (Signed)
WE ARE STARTING A NEW MEDICINE - HYDROCHLOROTHIAZIDE, CAN USE 1/2 TABLET FOR THE FIRST FEW DAYS, THEN GO UP TO A WHOLE TABLET.   IF YOU'RE DOING WELL, LET ME KNOW AND WE CAN CONTINUE THE MEDICINE FOR A MONTH.

## 2016-07-18 NOTE — Progress Notes (Signed)
HPI: Dana Calderon is a 75 y.o. female  who presents to [facility] today, 07/18/16,  for chief complaint of:  Chief Complaint  Patient presents with  . Follow-up    BLOOD PRESSURE    Hypertension:   Significant difficulty finding a regimen that works with the patient, she has a great many drug intolerances on her list, most of which she cannot remember a specific reaction to. Patient has no history of anaphylactic reaction to any medication that she can recall.   At last visit, we tried amlodipine at low dose. Patient states that after taking this medicine, she developed this feeling like she was floating out the front of her 4 head. She is convinced that this is a side effect of the medication, it was not consistent with her usual headache symptoms, she did not experience any dizziness or falls. She has not taken any more of the medicine.  Patient is trying to reduce stress by selling her townhome, moving into an independent living/seniors facility, where she will be able to socialize more, meals are provided, cleaning is provided, etc. He is hoping that decreasing stress will help with blood pressure as well     Past medical, surgical, social and family history reviewed: Past Medical History:  Diagnosis Date  . Anxiety   . Back pain, chronic   . GERD (gastroesophageal reflux disease)   . Hemorrhoid   . Hypertension   . IBS (irritable bowel syndrome)   . Migraine   . Vertigo    Past Surgical History:  Procedure Laterality Date  . ABDOMINAL HYSTERECTOMY    . BREAST SURGERY    . HERNIA REPAIR    . KNEE ARTHROSCOPY Left   . LUMBAR DISC SURGERY    . MASTECTOMY    . TONSILLECTOMY    . TUBAL LIGATION     Social History  Substance Use Topics  . Smoking status: Never Smoker  . Smokeless tobacco: Never Used  . Alcohol use No   Family History  Problem Relation Age of Onset  . Hypertension Mother   . Stroke Father      Current  medication list and allergy/intolerance information reviewed:   Current Outpatient Prescriptions  Medication Sig Dispense Refill  . ALPRAZolam (XANAX) 0.25 MG tablet TAKE ONE TABLET BY MOUTH AT BEDTIME AS NEEDED FOR ANXIETY (Patient taking differently: TAKE 0.125 MG BY MOUTH AT BEDTIME AS NEEDED FOR ANXIETY) 30 tablet 0  . amLODipine (NORVASC) 2.5 MG tablet Take 1 tablet (2.5 mg total) by mouth daily. Can increase to 2 tablets (5 mg total) after 1-2 weeks. 30 tablet 1  . CRANBERRY PO Take 1 capsule by mouth daily.    . diphenoxylate-atropine (LOMOTIL) 2.5-0.025 MG per tablet Take 1 tablet by mouth 4 (four) times daily as needed for diarrhea or loose stools.     . hydrocortisone (ANUSOL-HC) 25 MG suppository Place 1 suppository (25 mg total) rectally 2 (two) times daily as needed for hemorrhoids or itching. 12 suppository 6  . lisinopril (PRINIVIL,ZESTRIL) 40 MG tablet Take 1 tablet (40 mg total) by mouth daily. 30 tablet 1  . loratadine (CLARITIN) 10 MG tablet Take 1 tablet (10 mg total) by mouth daily. 10 tablet 0  . MAGNESIUM PO Take 1 tablet by mouth daily.    . meclizine (ANTIVERT) 25 MG tablet Take 25 mg by mouth 3 (three) times daily as needed for dizziness.    . meclizine (ANTIVERT) 25 MG tablet Take 1  tablet (25 mg total) by mouth 3 (three) times daily as needed for dizziness. 30 tablet 0  . Multiple Minerals-Vitamins (CALCIUM-MAGNESIUM-ZINC-D3 PO) Take 1 tablet by mouth 2 (two) times daily.    . Multiple Vitamin (MULTIVITAMIN) tablet Take 1 tablet by mouth daily.    . Nebivolol HCl (BYSTOLIC) 20 MG TABS Take 2 tablets (40 mg total) by mouth daily. 60 tablet 0  . omeprazole (PRILOSEC) 20 MG capsule TAKE ONE CAPSULE BY MOUTH EVERY DAY (Patient taking differently: TAKE 20 MG BY MOUTH EVERY DAY) 90 capsule 0  . OVER THE COUNTER MEDICATION Take 1 capsule by mouth daily. "Mega Red"    . Polyethylene Glycol 400 (BLINK TEARS OP) Place 1 drop into both eyes as needed (for dry eyes).    . sodium  chloride (OCEAN) 0.65 % SOLN nasal spray Place 1 spray into both nostrils as needed for congestion. 15 mL 0  . triamcinolone cream (KENALOG) 0.1 % Apply to affected areas twice a day for up to two weeks, avoid face. 80 g 0  . valACYclovir (VALTREX) 500 MG tablet Take 1 tablet (500 mg total) by mouth 2 (two) times daily. May refill if new outbreak occurs. 6 tablet 4  . VITAMIN E PO Take 1 capsule by mouth daily.     No current facility-administered medications for this visit.    Allergies  Allergen Reactions  . Cefuroxime Axetil Shortness Of Breath  . Edarbi [Azilsartan] Other (See Comments)    Heart pounding, foot swelling  . Methylprednisolone Other (See Comments) and Hypertension    Raised blood pressure and sugar  . Nystop [Nystatin] Other (See Comments)    Burning skin and breathing problems  . Penicillins Other (See Comments)    Face broke out in lumps  . Red Dye Itching, Rash and Other (See Comments)    Fainting  . Tamiflu [Oseltamivir Phosphate] Diarrhea and Other (See Comments)    Fainting  . Verelan [Verapamil Hcl Er] Shortness Of Breath  . Zithromax [Azithromycin] Shortness Of Breath  . Carbidopa-Levodopa Itching and Other (See Comments)    Itchy ears, throat and tongue   . Doxazosin Mesylate Other (See Comments)    Fast pulse  . Hctz [Hydrochlorothiazide] Other (See Comments)    Cannot tolerate  . Hyoscyamine Other (See Comments)    Shakes, dizzy and headache  . Influenza Vaccines Other (See Comments)    "head in a vice", nasal itching  . Other Other (See Comments)    Cardiolyte injection - burning through body  . Amlodipine Besylate Other (See Comments)    Unknown  . Asa [Aspirin] Rash  . Atenolol Other (See Comments)    Unknown  . Avelox [Moxifloxacin] Other (See Comments)    Unknown  . Benicar [Olmesartan] Other (See Comments)    Unknown  . Bentyl [Dicyclomine Hcl] Other (See Comments)    Headache and high BP  . Benzonatate Other (See Comments)    Unknown   . Bisoprolol Other (See Comments)    Unknown  . Cardizem [Diltiazem Hcl] Other (See Comments)    Unknown  . Carvedilol Other (See Comments)    Unknown  . Cefdinir Other (See Comments)    Unknown  . Chlorthalidone Other (See Comments)    Unknown  . Ciprofloxacin Other (See Comments)    Heartburn  . Citalopram Other (See Comments)    Unknown  . Clonidine Derivatives Other (See Comments)    Unknown  . Diovan [Valsartan] Other (See Comments)    Unknown  .  Doxycycline Other (See Comments)    Headache  . Erythromycin Other (See Comments)    Unknown  . Fioricet [Butalbital-Apap-Caffeine] Other (See Comments)    Headache and dizzy  . Flexeril [Cyclobenzaprine] Other (See Comments)    Unknown  . Furosemide Other (See Comments)    Unknown  . Gabapentin Other (See Comments)    Unknown  . Hydralazine Other (See Comments)    Headache  . Hydroxyzine Other (See Comments)    nightmares  . Isosorbide Other (See Comments)    Unknown  . Lyrica [Pregabalin] Other (See Comments)    Unknown  . Macrobid [employer] Macro] Other (See Comments)    Unknown  . Mavik [Trandolapril] Other (See Comments)    Unknown  . Metronidazole Itching and Other (See Comments)    Dizzy  . Naproxen Other (See Comments)    Unknown  . Neo-Polycin Hc [Bacitra-Neomycin-Polymyxin-Hc] Itching  . Nexium [Esomeprazole Magnesium] Other (See Comments)    Headache  . Norflex [Orphenadrine Citrate] Other (See Comments)  . Oxybutynin Chloride Er Other (See Comments)    Unknown  . Paxil [Paroxetine Hcl] Other (See Comments)    Unknown  . Phenazopyridine Other (See Comments)    Unknown  . Prazosin Other (See Comments)    Unknown  . Prednisone Other (See Comments)    Unknown  . Promethazine Other (See Comments)    Unknown  . Prozac [Fluoxetine Hcl] Other (See Comments)    Unknown  . Spironolactone Other (See Comments)    Unknown  . Sulfur Other (See Comments)    Unknown  . Tizanidine Other (See  Comments)    Unknown  . Valium [Diazepam] Other (See Comments)    "Too strong?"  . Zoloft [Sertraline Hcl] Other (See Comments)    Unknown      Review of Systems:  Constitutional:  No  fever, no chills, No recent illness  HEENT: (+) headache, no vision change, no hearing change  Cardiac: No  chest pain, No  pressure, No palpitations, No  Orthopnea  Respiratory:  No  shortness of breath. No  Cough  Gastrointestinal: No  abdominal pain, No  nausea, No  vomiting,  Psychiatric: No  concerns with depression, (+) stress and concerns with anxiety, No sleep problems, No mood problems    Exam:  BP (!) 172/84   Pulse 72   Ht 5' 6.5" (1.689 m)   Wt 203 lb (92.1 kg)   BMI 32.27 kg/m   Constitutional: VS see above. General Appearance: alert, well-developed, well-nourished, NAD  Eyes: Normal lids and conjunctive, non-icteric sclera  Ears, Nose, Mouth, Throat: MMM, Normal external inspection ears/nares/mouth/lips/gums  Neck: No masses, trachea midline. No thyroid enlargement. No tenderness/mass appreciated. No lymphadenopathy  Respiratory: Normal respiratory effort. no wheeze, no rhonchi, no rales  Cardiovascular: S1/S2 normal, no murmur, no rub/gallop auscultated. RRR. No lower extremity edema.   Psychiatric: Normal judgment/insight. Normal mood and affect. Oriented x3.    No results found.   ASSESSMENT/PLAN:   Long discussion with the patient about antihypertension goals. I think it's worth the risk of some mild drug intolerance to get her blood pressure to goal to reduce her headache symptoms and to reduce her overall risk of heart attack/stroke and other problems. I'm not convinced that her reaction as noted above was due to the amlodipine, but patient is not willing to try to take this medication again, she has already thrown it out.  See previous progress note for full discussion of challenges of management  in light of her many drug intolerances, continuing with same plan  today.  Hydrochlorothiazide is something that the patient said she took a long time ago, she cannot remember a reaction to it. Again, no history of anaphylaxis.  Trial hydrochlorothiazide, patient requests a very small number of pills, for financial reasons. Advised she can try half a tablet at first, see patient printed instructions  Essential hypertension - Plan: hydrochlorothiazide (HYDRODIURIL) 12.5 MG tablet     Visit summary with medication list and pertinent instructions was printed for patient to review. All questions at time of visit were answered - patient instructed to contact office with any additional concerns. ER/RTC precautions were reviewed with the patient. Follow-up plan: Return in about 4 weeks (around 08/15/2016), or soonre if needed, for BLOOD PRESSURE FOLLOW-UP.  Note: Total time spent 25 minutes, greater than 50% of the visit was spent face-to-face counseling and coordinating care for the following: The encounter diagnosis was Essential hypertension.Marland Kitchen

## 2016-07-21 ENCOUNTER — Other Ambulatory Visit: Payer: Self-pay | Admitting: Sports Medicine

## 2016-07-22 ENCOUNTER — Telehealth: Payer: Self-pay

## 2016-07-23 ENCOUNTER — Ambulatory Visit (INDEPENDENT_AMBULATORY_CARE_PROVIDER_SITE_OTHER): Payer: Medicare Other

## 2016-07-23 ENCOUNTER — Encounter: Payer: Self-pay | Admitting: Family Medicine

## 2016-07-23 ENCOUNTER — Ambulatory Visit (INDEPENDENT_AMBULATORY_CARE_PROVIDER_SITE_OTHER): Payer: Medicare Other | Admitting: Family Medicine

## 2016-07-23 VITALS — BP 174/61 | HR 59 | Wt 201.0 lb

## 2016-07-23 DIAGNOSIS — Z23 Encounter for immunization: Secondary | ICD-10-CM | POA: Diagnosis not present

## 2016-07-23 DIAGNOSIS — M25571 Pain in right ankle and joints of right foot: Secondary | ICD-10-CM

## 2016-07-23 DIAGNOSIS — M79672 Pain in left foot: Secondary | ICD-10-CM

## 2016-07-23 DIAGNOSIS — M7989 Other specified soft tissue disorders: Secondary | ICD-10-CM | POA: Insufficient documentation

## 2016-07-23 DIAGNOSIS — M25471 Effusion, right ankle: Secondary | ICD-10-CM | POA: Diagnosis not present

## 2016-07-23 MED ORDER — DICLOFENAC SODIUM 1 % TD GEL
2.0000 g | Freq: Four times a day (QID) | TRANSDERMAL | 11 refills | Status: DC
Start: 2016-07-23 — End: 2017-01-29

## 2016-07-23 NOTE — Telephone Encounter (Signed)
Since alprazolam is a controlled substance this has to be managed by the primary care physician.

## 2016-07-23 NOTE — Telephone Encounter (Signed)
Pt.notified

## 2016-07-23 NOTE — Progress Notes (Signed)
Dana Calderon is a 75 y.o. female who presents to [facility] today for foot and ankle pain. Patient notes a 1-1/2 week history of right ankle and left foot pain. She notes her feet slipped stepping down from an ambulance about a week and a half ago. Since then she's noted medial right ankle pain and mid left foot pain. Pain is severe at times. She has tried rest ice elevation and compression which have helped a little. She notes left foot is worse in the right ankle. Pain is worse with ambulation and better with rest. No radiating pain weakness or numbness.   Past Medical History:  Diagnosis Date  . Anxiety   . Back pain, chronic   . GERD (gastroesophageal reflux disease)   . Hemorrhoid   . Hypertension   . IBS (irritable bowel syndrome)   . Migraine   . Vertigo    Past Surgical History:  Procedure Laterality Date  . ABDOMINAL HYSTERECTOMY    . BREAST SURGERY    . HERNIA REPAIR    . KNEE ARTHROSCOPY Left   . LUMBAR DISC SURGERY    . MASTECTOMY    . TONSILLECTOMY    . TUBAL LIGATION     Social History  Substance Use Topics  . Smoking status: Never Smoker  . Smokeless tobacco: Never Used  . Alcohol use No   family history includes Hypertension in her mother; Stroke in her father.  ROS as above:  Medications: Current Outpatient Prescriptions  Medication Sig Dispense Refill  . ALPRAZolam (XANAX) 0.25 MG tablet TAKE ONE TABLET BY MOUTH AT BEDTIME AS NEEDED FOR ANXIETY (Patient taking differently: TAKE 0.125 MG BY MOUTH AT BEDTIME AS NEEDED FOR ANXIETY) 30 tablet 0  . clotrimazole-betamethasone (LOTRISONE) cream Apply 1 application topically 2 (two) times daily.    Marland Kitchen CRANBERRY PO Take 1 capsule by mouth daily.    . diphenoxylate-atropine (LOMOTIL) 2.5-0.025 MG per tablet Take 1 tablet by mouth 4 (four) times daily as needed for diarrhea or loose stools.       . fluocinonide cream (LIDEX) [id number] % Apply 1 application topically 2 (two) times daily.    . hydrochlorothiazide (HYDRODIURIL) 12.5 MG tablet Take 1 tablet (12.5 mg total) by mouth daily. 3 tablet 0  . hydrocortisone (ANUSOL-HC) 25 MG suppository Place 1 suppository (25 mg total) rectally 2 (two) times daily as needed for hemorrhoids or itching. 12 suppository 6  . ibuprofen (ADVIL,MOTRIN) 200 MG tablet Take 200 mg by mouth every 6 (six) hours as needed.    Marland Kitchen lisinopril (PRINIVIL,ZESTRIL) 40 MG tablet Take 1 tablet (40 mg total) by mouth daily. 30 tablet 1  . loratadine (CLARITIN) 10 MG tablet Take 1 tablet (10 mg total) by mouth daily. 10 tablet 0  . meclizine (ANTIVERT) 25 MG tablet Take 1 tablet (25 mg total) by mouth 3 (three) times daily as needed for dizziness. 30 tablet 0  . Multiple Minerals-Vitamins (CALCIUM-MAGNESIUM-ZINC-D3 PO) Take 1 tablet by mouth 2 (two) times daily.    . Multiple Vitamin (MULTIVITAMIN) tablet Take 1 tablet by mouth daily.    . Nebivolol HCl (BYSTOLIC) 20 MG TABS Take 2 tablets (40 mg total) by mouth daily. 60 tablet 0  . omeprazole (PRILOSEC) 20 MG capsule TAKE ONE CAPSULE BY MOUTH EVERY DAY (Patient taking differently: TAKE 20 MG BY MOUTH EVERY DAY) 90 capsule 0  . OVER THE COUNTER MEDICATION Take 1 capsule by mouth daily. "  Mega Red"    . Polyethylene Glycol 400 (BLINK TEARS OP) Place 1 drop into both eyes as needed (for dry eyes).    . sodium chloride (OCEAN) 0.65 % SOLN nasal spray Place 1 spray into both nostrils as needed for congestion. 15 mL 0  . sodium chloride (OCEAN) 0.65 % SOLN nasal spray Place 1 spray into both nostrils as needed for congestion.    . triamcinolone cream (KENALOG) 0.1 % Apply to affected areas twice a day for up to two weeks, avoid face. 80 g 0  . valACYclovir (VALTREX) 500 MG tablet Take 1 tablet (500 mg total) by mouth 2 (two) times daily. May refill if new outbreak occurs. 6 tablet 4  . diclofenac sodium (VOLTAREN) 1 % GEL Apply 2 g  topically 4 (four) times daily. To affected joint. 100 g 11   No current facility-administered medications for this visit.    Allergies  Allergen Reactions  . Cefuroxime Axetil Shortness Of Breath  . Edarbi [Azilsartan] Other (See Comments)    Heart pounding, foot swelling  . Methylprednisolone Other (See Comments) and Hypertension    Raised blood pressure and sugar  . Nystop [Nystatin] Other (See Comments)    Burning skin and breathing problems  . Penicillins Other (See Comments)    Face broke out in lumps  . Red Dye Itching, Rash and Other (See Comments)    Fainting  . Tamiflu [Oseltamivir Phosphate] Diarrhea and Other (See Comments)    Fainting  . Verelan [Verapamil Hcl Er] Shortness Of Breath  . Zithromax [Azithromycin] Shortness Of Breath  . Carbidopa-Levodopa Itching and Other (See Comments)    Itchy ears, throat and tongue   . Doxazosin Mesylate Other (See Comments)    Fast pulse  . Hctz [Hydrochlorothiazide] Other (See Comments)    Cannot tolerate  . Hyoscyamine Other (See Comments)    Shakes, dizzy and headache  . Influenza Vaccines Other (See Comments)    "head in a vice", nasal itching  . Other Other (See Comments)    Cardiolyte injection - burning through body  . Amlodipine Besylate Other (See Comments)    Unknown  . Asa [Aspirin] Rash  . Atenolol Other (See Comments)    Unknown  . Avelox [Moxifloxacin] Other (See Comments)    Unknown  . Benicar [Olmesartan] Other (See Comments)    Unknown  . Bentyl [Dicyclomine Hcl] Other (See Comments)    Headache and high BP  . Benzonatate Other (See Comments)    Unknown  . Bisoprolol Other (See Comments)    Unknown  . Cardizem [Diltiazem Hcl] Other (See Comments)    Unknown  . Carvedilol Other (See Comments)    Unknown  . Cefdinir Other (See Comments)    Unknown  . Chlorthalidone Other (See Comments)    Unknown  . Ciprofloxacin Other (See Comments)    Heartburn  . Citalopram Other (See Comments)    Unknown    . Clonidine Derivatives Other (See Comments)    Unknown  . Diovan [Valsartan] Other (See Comments)    Unknown  . Doxycycline Other (See Comments)    Headache  . Erythromycin Other (See Comments)    Unknown  . Fioricet [Butalbital-Apap-Caffeine] Other (See Comments)    Headache and dizzy  . Flexeril [Cyclobenzaprine] Other (See Comments)    Unknown  . Furosemide Other (See Comments)    Unknown  . Gabapentin Other (See Comments)    Unknown  . Hydralazine Other (See Comments)    Headache  .  Hydroxyzine Other (See Comments)    nightmares  . Isosorbide Other (See Comments)    Unknown  . Lyrica [Pregabalin] Other (See Comments)    Unknown  . Macrobid [employer] Macro] Other (See Comments)    Unknown  . Mavik [Trandolapril] Other (See Comments)    Unknown  . Metronidazole Itching and Other (See Comments)    Dizzy  . Naproxen Other (See Comments)    Unknown  . Neo-Polycin Hc [Bacitra-Neomycin-Polymyxin-Hc] Itching  . Nexium [Esomeprazole Magnesium] Other (See Comments)    Headache  . Norflex [Orphenadrine Citrate] Other (See Comments)  . Oxybutynin Chloride Er Other (See Comments)    Unknown  . Paxil [Paroxetine Hcl] Other (See Comments)    Unknown  . Phenazopyridine Other (See Comments)    Unknown  . Prazosin Other (See Comments)    Unknown  . Prednisone Other (See Comments)    Unknown  . Promethazine Other (See Comments)    Unknown  . Prozac [Fluoxetine Hcl] Other (See Comments)    Unknown  . Spironolactone Other (See Comments)    Unknown  . Sulfur Other (See Comments)    Unknown  . Tizanidine Other (See Comments)    Unknown  . Valium [Diazepam] Other (See Comments)    "Too strong?"  . Zoloft [Sertraline Hcl] Other (See Comments)    Unknown     Exam:  BP (!) 174/61   Pulse (!) 59   Wt 201 lb (91.2 kg)   BMI 31.96 kg/m  Gen: Well NAD Right ankle normal-appearing. Mildly tender palpation medial malleolus and tarsal tunnel.  motion: Pain and  guarding with motion Pulses: Intact  Left foot: Normal-appearing. Tender palpation dorsal midfoot Motion: Mild pain with motion some guarding present. Pulses: Intact  X-ray right ankle: Degenerative change present. No acute abnormality. Awaiting formal radiology review  X-ray left foot:  Degenerative change present. No acute abnormality. Awaiting formal radiology review   No results found for this or any previous visit (from the past 24 hour(s)). No results found.    Assessment and Plan: 75 y.o. female with  Right ankle and left foot pain: Likely strain. Plan for diclofenac gel and physical therapy. Recheck in one month.    Orders Placed This Encounter  Procedures  . DG Ankle Complete Right    Order Specific Question:   Reason for Exam (SYMPTOM  OR DIAGNOSIS REQUIRED)    Answer:   eval medial ankle pain    Order Specific Question:   Preferred imaging location?    Answer:   [facility]  . DG Foot Complete Left    Order Specific Question:   Reason for Exam (SYMPTOM  OR DIAGNOSIS REQUIRED)    Answer:   eval med foot pain    Order Specific Question:   Preferred imaging location?    Answer:   [facility]  . Flu Vaccine QUAD 36+ mos IM  . Ambulatory referral to Physical Therapy    Referral Priority:   Routine    Referral Type:   Physical Medicine    Referral Reason:   Specialty Services Required    Requested Specialty:   Physical Therapy    Number of Visits Requested:   1    Discussed warning signs or symptoms. Please see discharge instructions. Patient expresses understanding.

## 2016-07-23 NOTE — Patient Instructions (Signed)
Thank you for coming in today. Try voltaren gel.  Attend PT.  Continue ICE and elevation and compression.  Recheck in 4 weeks.

## 2016-07-24 ENCOUNTER — Telehealth: Payer: Self-pay

## 2016-07-24 MED ORDER — ALPRAZOLAM 0.25 MG PO TABS: 0.2500 mg | ORAL_TABLET | Freq: Every evening | ORAL | 0 refills | Status: DC | PRN

## 2016-07-24 NOTE — Telephone Encounter (Signed)
PATIENT REQUEST REFILL FOR XANAX #[phone] REFILLS FAXED TO [facility]. [name],CMA

## 2016-07-25 ENCOUNTER — Telehealth: Payer: Self-pay

## 2016-07-25 NOTE — Telephone Encounter (Signed)
Sounds good to me

## 2016-07-25 NOTE — Telephone Encounter (Signed)
Patient called stated that the hydrochlorothiazide is working for her blood pressure and she would like to stay on that for a while. Sabas Frett,CMA

## 2016-07-26 ENCOUNTER — Other Ambulatory Visit: Payer: Self-pay | Admitting: Osteopathic Medicine

## 2016-07-27 ENCOUNTER — Other Ambulatory Visit: Payer: Self-pay | Admitting: Family Medicine

## 2016-07-30 ENCOUNTER — Encounter: Payer: Self-pay | Admitting: Rehabilitative and Restorative Service Providers"

## 2016-07-30 ENCOUNTER — Ambulatory Visit (INDEPENDENT_AMBULATORY_CARE_PROVIDER_SITE_OTHER): Payer: Medicare Other | Admitting: Rehabilitative and Restorative Service Providers"

## 2016-07-30 ENCOUNTER — Other Ambulatory Visit: Payer: Self-pay

## 2016-07-30 DIAGNOSIS — R29898 Other symptoms and signs involving the musculoskeletal system: Secondary | ICD-10-CM | POA: Diagnosis not present

## 2016-07-30 DIAGNOSIS — M25571 Pain in right ankle and joints of right foot: Secondary | ICD-10-CM

## 2016-07-30 DIAGNOSIS — I10 Essential (primary) hypertension: Secondary | ICD-10-CM

## 2016-07-30 MED ORDER — HYDROCHLOROTHIAZIDE 12.5 MG PO TABS: 12.5000 mg | ORAL_TABLET | Freq: Every day | ORAL | 1 refills | Status: DC

## 2016-07-30 NOTE — Patient Instructions (Addendum)
HIP: Hamstrings - Supine   Place strap around foot. Raise leg up, keeping knee straight.  Bend opposite knee to protect back if indicated. Hold 30 seconds. 3 reps per set, 2-3 sets per day   Achilles / Gastroc, Standing    Stand, right foot behind, heel on floor and turned slightly out, leg straight, forward leg bent. Move hips forward. Hold _30__ seconds. Repeat _3__ times per session. Do _2-3__ sessions per day.   Massage the bottom of your foot with a frozen water bottle of golf ball    Dorsiflexion: Resisted    Facing anchor, tubing around left foot, pull toward face.  Repeat ___10_ times per set. Do _2-3___ sets per session. Do __1-2__ sessions per day.     Plantar Flexion: Resisted    Anchor behind, tubing around left foot, press down. Repeat _10___ times per set. Do __2-3__ sets per session. Do _1-2___ sessions per day.   .  Inversion: Resisted    Cross legs with right leg underneath, foot in tubing loop. Hold tubing around other foot to resist and turn foot in. Repeat _10___ times per set. Do ___2-3_ sets per session. Do 1-2____ sessions per day.    Eversion: Resisted    With right foot in tubing loop, hold tubing around other foot to resist and turn foot out. Repeat __10__ times per set. Do __2-3__ sets per session. Do _1-2___ sessions per day.    Balance: Unilateral    Attempt to balance on left leg, eyes open. Hold __30-60__ seconds. Repeat _3-5___ times per set.  Do _2___ sessions per day. Perform exercise with eyes closed.

## 2016-07-30 NOTE — Therapy (Signed)
[facility] [place], [place], [address] Phone: [phone]   Fax:  [phone]  Physical Therapy Evaluation  Patient Details  Name: Dana Calderon MRN: [id number] Date of Birth: 1941/02/07 Referring Provider: Dr. Lynne Leader  Encounter Date: 07/30/2016      PT End of Session - 07/30/16 1058    Visit Number 1   Number of Visits 6   Date for PT Re-Evaluation 09/10/16   PT Start Time 1017   PT Stop Time 1106   PT Time Calculation (min) 49 min   Activity Tolerance Patient tolerated treatment well      Past Medical History:  Diagnosis Date  . Anxiety   . Back pain, chronic   . GERD (gastroesophageal reflux disease)   . Hemorrhoid   . Hypertension   . IBS (irritable bowel syndrome)   . Migraine   . Vertigo     Past Surgical History:  Procedure Laterality Date  . ABDOMINAL HYSTERECTOMY    . BREAST SURGERY    . HERNIA REPAIR    . KNEE ARTHROSCOPY Left   . LUMBAR DISC SURGERY    . MASTECTOMY    . TONSILLECTOMY    . TUBAL LIGATION      There were no vitals filed for this visit.       Subjective Assessment - 07/30/16 1021    Subjective Patient reports that she fell turning the Rt ankle about 3 weeks ago ~ 07/11/16. She has pain in the Rt ankle on an intermittent basis   Pertinent History Hx of LBP and shoulder pain; migraine headaches requiring ED visits several in the past few montths. HTN   How long can you sit comfortably? no limit   How long can you stand comfortably? 5 min    How long can you walk comfortably? 2-5 min    Diagnostic tests xrays (-)    Patient Stated Goals get rid og ankle pain and be able to walk without pain    Currently in Pain? Yes   Pain Score 4    Pain Location Ankle   Pain Orientation Right   Pain Descriptors / Indicators Dull;Aching   Pain Type Acute pain   Pain Onset 1 to 4 weeks ago   Pain Frequency Intermittent   Aggravating Factors  standing or walking    Pain Relieving Factors getting off feet             [facility] PT Assessment - 07/30/16 0001      Assessment   Medical Diagnosis Rt ankle pain    Referring Provider Dr. Lynne Leader   Onset Date/Surgical Date 07/11/16   Hand Dominance Right   Next MD Visit 9/17   Prior Therapy none for ankle      Balance Screen   Has the patient fallen in the past 6 months Yes   How many times? 1   Has the patient had a decrease in activity level because of a fear of falling?  No   Is the patient reluctant to leave their home because of a fear of falling?  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   Leisure sedentary - household chores      Observation/Other Assessments   Focus on  Therapeutic Outcomes (FOTO)  54% limitation      Observation/Other Assessments-Edema    Edema --  mild edema Rt ankle      Sensation   Additional Comments WFL's per pt report      Posture/Postural Control   Posture Comments forward flexed posture      AROM   Overall AROM Comments end range tightness noted bilat hips; knees WNL's    Right Ankle Dorsiflexion 0   Right Ankle Plantar Flexion 42   Right Ankle Inversion 22   Right Ankle Eversion 15   Left Ankle Dorsiflexion 0   Left Ankle Plantar Flexion 45   Left Ankle Inversion 24   Left Ankle Eversion 18     Strength   Overall Strength Comments bilat LE strength 4/5 to 4+/5 throughout - hips assessed in supine/knees in sitting; ankle supine   Right Ankle Dorsiflexion 5/5   Right Ankle Plantar Flexion 4/5   Right Ankle Inversion 5/5   Right Ankle Eversion --  5-/5   Left Ankle Dorsiflexion 5/5   Left Ankle Plantar Flexion 4/5   Left Ankle Inversion 5/5   Left Ankle Eversion 5/5     Flexibility   Hamstrings Rt 55 deg; Lt 60 deg   ITB tight bilat    Piriformis tight bilat       Palpation   Palpation comment tender Rt ATF ligament and lateral ankle; plantar surface of foot mid plantar fascia       Ambulation/Gait   Gait Comments WFL's with LE's in ER - no notable limp      High Level Balance   High Level Balance Comments unable to balance on one leg independently - can balance with bilat UE support                    [facility] Adult PT Treatment/Exercise - 07/30/16 0001      Knee/Hip Exercises: Stretches   Passive Hamstring Stretch 3 reps;30 seconds     Moist Heat Therapy   Number Minutes Moist Heat 15 Minutes   Moist Heat Location Lumbar Spine     Cryotherapy   Number Minutes Cryotherapy 15 Minutes   Cryotherapy Location Ankle  Rt   Type of Cryotherapy Ice pack     Ankle Exercises: Stretches   Gastroc Stretch 3 reps;30 seconds     Ankle Exercises: Supine   T-Band 4 way TB x 10 - red TB                 PT Education - 07/30/16 1058    Education provided Yes   Education Details HEP   Person(s) Educated Patient   Methods Explanation;Demonstration;Tactile cues;Verbal cues;Handout   Comprehension Verbalized understanding;Returned demonstration;Verbal cues required;Tactile cues required             PT Long Term Goals - 07/30/16 1258      PT LONG TERM GOAL #1   Title Improve Rt ankle ROM to equal or better than Lt ankle ROM 09/10/16   Time 6   Period Weeks   Status New     PT LONG TERM GOAL #2   Title Improve standing balance with patient to demonstrate single leg stance with minimal to no UE support for 30 sec 09/10/16   Time 6   Period Weeks   Status New     PT LONG TERM GOAL #3   Title Patient independent in HEP 09/10/16   Time 6   Period Weeks  Status New     PT LONG TERM GOAL #4   Title Improve FOTO to </= 54% limitation 09/10/16   Time 6   Period Weeks   Status New               Plan - 07/30/16 1102    Clinical Impression Statement Patient presents with Rt ankle sprain 07/11/16. She has  decreased ankle ROM and strength; decresaed LE strength; decreased balance; persistent pain and limited functional activity level.    Rehab Potential Good   PT Frequency 1x / week   PT Duration 6 weeks   PT Treatment/Interventions Patient/family education;ADLs/Self Care Home Management;Cryotherapy;Electrical Stimulation;Iontophoresis 4mg /ml Dexamethasone;Moist Heat;Ultrasound;Therapeutic activities;Therapeutic exercise;Balance training;Neuromuscular re-education;Dry needling;Manual techniques   PT Next Visit Plan progress with exercise; manual work and modalities inc ionto Rt ankle as indicated   Consulted and Agree with Plan of Care Patient      Patient will benefit from skilled therapeutic intervention in order to improve the following deficits and impairments:  Improper body mechanics, Decreased strength, Decreased mobility, Decreased range of motion, Pain, Decreased activity tolerance  Visit Diagnosis: Pain in right ankle and joints of right foot - Plan: PT plan of care cert/re-cert  Other symptoms and signs involving the musculoskeletal system - Plan: PT plan of care cert/re-cert     Problem List Patient Active Problem List   Diagnosis Date Noted  . Right ankle pain 07/23/2016  . Cough 02/22/2016  . Hyponatremia 02/22/2016  . 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 Chest pain 06/30/2015  . Anxiety 06/30/2015  . Mouth lesion 06/30/2015  . Essential hypertension 05/30/2015  . Prediabetes 05/30/2015  . Seborrheic dermatitis 05/30/2015    Celyn Nilda Simmer PT, MPH  07/30/2016, 1:04 PM  [facility] [place], [place], [address] Phone: [phone]   Fax:  [phone]  Name: Dana Calderon MRN: [id number] Date of Birth: May 23, 1941

## 2016-07-31 ENCOUNTER — Telehealth: Payer: Self-pay | Admitting: *Deleted

## 2016-07-31 NOTE — Telephone Encounter (Signed)
This was submitted and approved through 12/01/2016. Called patient and she states she is not going to pick up the medication. She states she has gone without it for a week so she will do without.  Did leave a message with the pharmacy to let them know that med was approved and pat decided not to pick med up.

## 2016-08-08 ENCOUNTER — Encounter: Payer: Medicare Other | Admitting: Physical Therapy

## 2016-08-15 ENCOUNTER — Ambulatory Visit: Payer: Medicare Other | Admitting: Osteopathic Medicine

## 2016-08-19 ENCOUNTER — Other Ambulatory Visit: Payer: Self-pay | Admitting: Osteopathic Medicine

## 2016-08-19 DIAGNOSIS — I10 Essential (primary) hypertension: Secondary | ICD-10-CM

## 2016-08-24 ENCOUNTER — Emergency Department (INDEPENDENT_AMBULATORY_CARE_PROVIDER_SITE_OTHER)
Admission: EM | Admit: 2016-08-24 | Discharge: 2016-08-24 | Disposition: A | Discharge status: Home / Self Care | Payer: Medicare Other | Source: Home / Self Care | Attending: Family Medicine | Admitting: Family Medicine

## 2016-08-24 ENCOUNTER — Encounter: Payer: Self-pay | Admitting: Emergency Medicine

## 2016-08-24 DIAGNOSIS — T148 Other injury of unspecified body region: Secondary | ICD-10-CM

## 2016-08-24 DIAGNOSIS — L089 Local infection of the skin and subcutaneous tissue, unspecified: Secondary | ICD-10-CM | POA: Diagnosis not present

## 2016-08-24 DIAGNOSIS — R600 Localized edema: Secondary | ICD-10-CM

## 2016-08-24 DIAGNOSIS — W57XXXA Bitten or stung by nonvenomous insect and other nonvenomous arthropods, initial encounter: Secondary | ICD-10-CM | POA: Diagnosis not present

## 2016-08-24 MED ORDER — CEPHALEXIN 500 MG PO CAPS: 500.0000 mg | ORAL_CAPSULE | Freq: Two times a day (BID) | ORAL | 0 refills | Status: DC

## 2016-08-24 NOTE — ED Provider Notes (Signed)
CSN: [id number]     Arrival date & time 08/24/16  1239 History   First MD Initiated Contact with Patient 08/24/16 1307     Chief Complaint  Patient presents with  . Insect Bite   (Consider location/radiation/quality/duration/timing/severity/associated sxs/prior Treatment) HPI Dana Calderon is a 75 y.o. female presenting to UC with c/o sudden onset Right lower leg swelling with redness including several red spots to medial side of her lower leg that she noticed this morning when she woke up.  She notes she has been packing and moving the last few days and is concerned she was bit by a spider last night.  She reports mild soreness and itching to her lower leg.  She also reports twisting at that same ankle about 1 month ago.  She did try using some ice but no relief. No other symptoms.      Past Medical History:  Diagnosis Date  . Anxiety   . Back pain, chronic   . GERD (gastroesophageal reflux disease)   . Hemorrhoid   . Hypertension   . IBS (irritable bowel syndrome)   . Migraine   . Vertigo    Past Surgical History:  Procedure Laterality Date  . ABDOMINAL HYSTERECTOMY    . BREAST SURGERY    . HERNIA REPAIR    . KNEE ARTHROSCOPY Left   . LUMBAR DISC SURGERY    . MASTECTOMY    . TONSILLECTOMY    . TUBAL LIGATION     Family History  Problem Relation Age of Onset  . Hypertension Mother   . Stroke Father    Social History  Substance Use Topics  . Smoking status: Never Smoker  . Smokeless tobacco: Never Used  . Alcohol use No   OB History    No data available     Review of Systems  Constitutional: Negative for chills and fever.  HENT: Negative for sore throat and trouble swallowing.   Respiratory: Negative for cough and shortness of breath.   Cardiovascular: Positive for leg swelling. Negative for chest pain and palpitations.  Gastrointestinal: Negative for diarrhea, nausea and vomiting.  Musculoskeletal: Positive for joint swelling and myalgias. Negative for  arthralgias and gait problem.       Right lower leg  Skin: Positive for color change and rash. Negative for wound.  Neurological: Negative for weakness and numbness.    Allergies  Cefuroxime axetil; Edarbi [azilsartan]; Methylprednisolone; Nystop [nystatin]; Penicillins; Red dye; Tamiflu [oseltamivir phosphate]; Verelan [verapamil hcl er]; Zithromax [azithromycin]; Carbidopa-levodopa; Doxazosin mesylate; Hctz [hydrochlorothiazide]; Hyoscyamine; Influenza vaccines; Other; Amlodipine besylate; Asa [aspirin]; Atenolol; Avelox [moxifloxacin]; Benicar [olmesartan]; Bentyl [dicyclomine hcl]; Benzonatate; Bisoprolol; Cardizem [diltiazem hcl]; Carvedilol; Cefdinir; Chlorthalidone; Ciprofloxacin; Citalopram; Clonidine derivatives; Diovan [valsartan]; Doxycycline; Erythromycin; Fioricet [butalbital-apap-caffeine]; Flexeril [cyclobenzaprine]; Furosemide; Gabapentin; Hydralazine; Hydroxyzine; Isosorbide; Lyrica [pregabalin]; Macrobid [nitrofurantoin monohyd macro]; Mavik [trandolapril]; Metronidazole; Naproxen; Neo-polycin hc [bacitra-neomycin-polymyxin-hc]; Nexium [esomeprazole magnesium]; Norflex [orphenadrine citrate]; Oxybutynin chloride er; Paxil [paroxetine hcl]; Phenazopyridine; Prazosin; Prednisone; Promethazine; Prozac [fluoxetine hcl]; Spironolactone; Sulfur; Tizanidine; Valium [diazepam]; and Zoloft [sertraline hcl]  Home Medications   Prior to Admission medications   Medication Sig Start Date End Date Taking? Authorizing Provider  ALPRAZolam (XANAX) 0.25 MG tablet Take 1 tablet (0.25 mg total) by mouth at bedtime as needed. 07/24/16   Emeterio Reeve, DO  BYSTOLIC 20 MG TABS TAKE TWO TABLETS BY MOUTH EVERY DAY 08/19/16   Emeterio Reeve, DO  cephALEXin (KEFLEX) 500 MG capsule Take 1 capsule (500 mg total) by mouth 2 (two) times daily. 08/24/16   Junie Panning  Hilda Blades, PA-C  clotrimazole-betamethasone (LOTRISONE) cream Apply 1 application topically 2 (two) times daily.    Historical Provider, MD  CRANBERRY  PO Take 1 capsule by mouth daily.    Historical Provider, MD  diclofenac sodium (VOLTAREN) 1 % GEL Apply 2 g topically 4 (four) times daily. To affected joint. 07/23/16   Gregor Hams, MD  diphenoxylate-atropine (LOMOTIL) 2.5-0.025 MG per tablet Take 1 tablet by mouth 4 (four) times daily as needed for diarrhea or loose stools.     Historical Provider, MD  fluocinonide cream (LIDEX) [id number] % Apply 1 application topically 2 (two) times daily.    Historical Provider, MD  hydrochlorothiazide (HYDRODIURIL) 12.5 MG tablet Take 1 tablet (12.5 mg total) by mouth daily. 07/30/16   Emeterio Reeve, DO  hydrocortisone (ANUSOL-HC) 25 MG suppository Place 1 suppository (25 mg total) rectally 2 (two) times daily as needed for hemorrhoids or itching. 07/08/16   Emeterio Reeve, DO  ibuprofen (ADVIL,MOTRIN) 200 MG tablet Take 200 mg by mouth every 6 (six) hours as needed.    Historical Provider, MD  lisinopril (PRINIVIL,ZESTRIL) 40 MG tablet TAKE ONE TABLET BY MOUTH EVERY DAY 08/19/16   Emeterio Reeve, DO  loratadine (CLARITIN) 10 MG tablet Take 1 tablet (10 mg total) by mouth daily. 07/04/16   Duffy Bruce, MD  meclizine (ANTIVERT) 25 MG tablet Take 1 tablet (25 mg total) by mouth 3 (three) times daily as needed for dizziness. 07/08/16   Emeterio Reeve, DO  Multiple Minerals-Vitamins (CALCIUM-MAGNESIUM-ZINC-D3 PO) Take 1 tablet by mouth 2 (two) times daily.    Historical Provider, MD  Multiple Vitamin (MULTIVITAMIN) tablet Take 1 tablet by mouth daily.    Historical Provider, MD  omeprazole (PRILOSEC) 20 MG capsule TAKE ONE CAPSULE BY MOUTH EVERY DAY 07/29/16   Emeterio Reeve, DO  OVER THE COUNTER MEDICATION Take 1 capsule by mouth daily. "Mega Red"    Historical Provider, MD  Polyethylene Glycol 400 (BLINK TEARS OP) Place 1 drop into both eyes as needed (for dry eyes).    Historical Provider, MD  sodium chloride (OCEAN) 0.65 % SOLN nasal spray Place 1 spray into both nostrils as needed for congestion. 07/04/16    Duffy Bruce, MD  sodium chloride (OCEAN) 0.65 % SOLN nasal spray Place 1 spray into both nostrils as needed for congestion.    Historical Provider, MD  triamcinolone cream (KENALOG) 0.1 % Apply to affected areas twice a day for up to two weeks, avoid face. 06/05/16 06/05/17  Marcial Pacas, DO  valACYclovir (VALTREX) 500 MG tablet Take 1 tablet (500 mg total) by mouth 2 (two) times daily. May refill if new outbreak occurs. 10/04/15   Marcial Pacas, DO   Meds Ordered and Administered this Visit  Medications - No data to display  BP 146/83 (BP Location: Right Arm)   Temp 98.4 F (36.9 C) (Oral)   Resp 16   Ht 5' 6.5" (1.689 m)   Wt 199 lb (90.3 kg)   SpO2 96%   BMI 31.64 kg/m  No data found.   Physical Exam  Constitutional: She is oriented to person, place, and time. She appears well-developed and well-nourished.  HENT:  Head: Normocephalic and atraumatic.  Eyes: EOM are normal.  Neck: Normal range of motion.  Cardiovascular: Normal rate.   Pulses:      Dorsalis pedis pulses are 2+ on the right side.  Pulmonary/Chest: Effort normal.  Musculoskeletal: Normal range of motion. She exhibits edema. She exhibits no tenderness.  Right lower leg: mild edema,  calf is soft, non-tender. Full ROM knee, ankle, and toes.   Neurological: She is alert and oriented to person, place, and time.  Skin: Skin is warm and dry. Capillary refill takes less than 2 seconds. Rash noted. There is erythema.  Right lower leg: skin in tact. Mild erythema and warmth to medial aspect with overlying flat erythematous papules that do blanch. No bleeding or discharge.   Psychiatric: She has a normal mood and affect. Her behavior is normal.  Nursing note and vitals reviewed.   Urgent Care Course   Clinical Course    Procedures (including critical care time)  Labs Review Labs Reviewed - No data to display  Imaging Review No results found.   MDM   1. Infected insect bite   2. Lower leg edema    Pt c/o  redness, swelling, and potential insect bites to Right lower leg after moving yesterday.  Calf is soft, non-tender. Low concern for DVT at this time. Will treat for mild cellulitis. Pt has multiple drug allergies but notes she was recently on Keflex for a tooth infection and did okay w/o side effects.  Rx: Keflex Home care instructions provided. Encouraged elevation and warm compresses. F/u this week for recheck of symptoms if not improving. Patient verbalized understanding and agreement with treatment plan.     Noland Fordyce, PA-C 08/24/16 1417

## 2016-08-24 NOTE — ED Triage Notes (Signed)
Patient states she moved yesterday and woke this morning with slight edema ans rash on the right lower leg

## 2016-08-26 ENCOUNTER — Other Ambulatory Visit: Payer: Self-pay | Admitting: Osteopathic Medicine

## 2016-08-27 ENCOUNTER — Ambulatory Visit: Payer: Medicare Other | Admitting: Family Medicine

## 2016-08-28 ENCOUNTER — Other Ambulatory Visit: Payer: Self-pay | Admitting: Family Medicine

## 2016-09-04 ENCOUNTER — Other Ambulatory Visit: Payer: Self-pay | Admitting: Family Medicine

## 2016-09-04 DIAGNOSIS — B009 Herpesviral infection, unspecified: Secondary | ICD-10-CM

## 2016-09-12 ENCOUNTER — Other Ambulatory Visit: Payer: Self-pay | Admitting: Osteopathic Medicine

## 2016-09-12 DIAGNOSIS — B009 Herpesviral infection, unspecified: Secondary | ICD-10-CM

## 2016-09-13 ENCOUNTER — Ambulatory Visit (INDEPENDENT_AMBULATORY_CARE_PROVIDER_SITE_OTHER): Payer: Medicare Other | Admitting: Osteopathic Medicine

## 2016-09-13 ENCOUNTER — Encounter: Payer: Self-pay | Admitting: Osteopathic Medicine

## 2016-09-13 VITALS — BP 159/62 | HR 65 | Ht 66.5 in | Wt 198.0 lb

## 2016-09-13 DIAGNOSIS — I10 Essential (primary) hypertension: Secondary | ICD-10-CM | POA: Diagnosis not present

## 2016-09-13 DIAGNOSIS — F99 Mental disorder, not otherwise specified: Secondary | ICD-10-CM

## 2016-09-13 DIAGNOSIS — B001 Herpesviral vesicular dermatitis: Secondary | ICD-10-CM

## 2016-09-13 DIAGNOSIS — W57XXXD Bitten or stung by nonvenomous insect and other nonvenomous arthropods, subsequent encounter: Secondary | ICD-10-CM

## 2016-09-13 DIAGNOSIS — B351 Tinea unguium: Secondary | ICD-10-CM | POA: Diagnosis not present

## 2016-09-13 DIAGNOSIS — R21 Rash and other nonspecific skin eruption: Secondary | ICD-10-CM

## 2016-09-13 DIAGNOSIS — R410 Disorientation, unspecified: Secondary | ICD-10-CM

## 2016-09-13 LAB — COMPLETE METABOLIC PANEL WITH GFR
ALT: 14 U/L (ref 6–29)
AST: 22 U/L (ref 10–35)
Albumin: 3.8 g/dL (ref 3.6–5.1)
Alkaline Phosphatase: 63 U/L (ref 33–130)
BUN: 15 mg/dL (ref 7–25)
CHLORIDE: 100 mmol/L (ref 98–110)
CO2: 28 mmol/L (ref 20–31)
Calcium: 9.3 mg/dL (ref 8.6–10.4)
Creat: 0.82 mg/dL (ref 0.60–0.93)
GFR, EST NON AFRICAN AMERICAN: 70 mL/min (ref >=60)
GFR, Est African American: 81 mL/min (ref >=60)
GLUCOSE: 109 mg/dL — AB (ref 65–99)
POTASSIUM: 4.4 mmol/L (ref 3.5–5.3)
SODIUM: 139 mmol/L (ref 135–146)
Total Bilirubin: 0.6 mg/dL (ref 0.2–1.2)
Total Protein: 7 g/dL (ref 6.1–8.1)

## 2016-09-13 LAB — CBC WITH DIFFERENTIAL/PLATELET
BASOS ABS: 0 {cells}/uL (ref 0–200)
Basophils Relative: 0 %
EOS PCT: 1 %
Eosinophils Absolute: 105 cells/uL (ref 15–500)
HCT: 40.5 % (ref 35.0–45.0)
Hemoglobin: 13.4 g/dL (ref 11.7–15.5)
Lymphocytes Relative: 23 %
Lymphs Abs: 2415 cells/uL (ref 850–3900)
MCH: 28.2 pg (ref 27.0–33.0)
MCHC: 33.1 g/dL (ref 32.0–36.0)
MCV: 85.1 fL (ref 80.0–100.0)
MONOS PCT: 7 %
MPV: 10.2 fL (ref 7.5–12.5)
Monocytes Absolute: 735 cells/uL (ref 200–950)
NEUTROS ABS: 7245 {cells}/uL (ref 1500–7800)
NEUTROS PCT: 69 %
PLATELETS: 255 10*3/uL (ref 140–400)
RBC: 4.76 MIL/uL (ref 3.80–5.10)
RDW: 14.3 % (ref 11.0–15.0)
WBC: 10.5 10*3/uL (ref 3.8–10.8)

## 2016-09-13 LAB — LIPID PANEL
CHOL/HDL RATIO: 3.3 ratio (ref <=5.0)
CHOLESTEROL: 143 mg/dL (ref 125–200)
HDL: 44 mg/dL — AB (ref >=46)
LDL Cholesterol: 59 mg/dL (ref <130)
TRIGLYCERIDES: 201 mg/dL — AB (ref <150)
VLDL: 40 mg/dL — AB (ref <30)

## 2016-09-13 LAB — HEMOGLOBIN A1C
Hgb A1c MFr Bld: 5.9 % — ABNORMAL HIGH (ref <5.7)
Mean Plasma Glucose: 123 mg/dL

## 2016-09-13 LAB — TSH: TSH: 2.56 mIU/L

## 2016-09-13 MED ORDER — CNL8 NAIL 8 % EX KIT: 1.0000 "application " | PACK | Freq: Two times a day (BID) | CUTANEOUS | 3 refills | Status: DC

## 2016-09-13 MED ORDER — HYDROCHLOROTHIAZIDE 12.5 MG PO TABS: 12.5000 mg | ORAL_TABLET | Freq: Every day | ORAL | 1 refills | Status: DC

## 2016-09-13 NOTE — Progress Notes (Signed)
HPI: Dana Calderon is a 75 y.o. female  who presents to [place] today, 09/13/16,  for chief complaint of:  Chief Complaint  Patient presents with  . Follow-up    BLOOD PRESSURE  . Insect Bite    Multiple complaints today, patient has list of concerns.   Recently moved. Thinks she pulled muscle behind the knee on the L. Using wraps, ice, heat, it is getting better slowly.   Thinks she may have pulled hernia/abdominal muscle as well in the process of moving.   Toenails are thick, she would like to know what she can do about this. Having trouble cutting the nails because they're so thick.  Was having some episodes where she refers to these as "blank outs" like "I wake up not sure what I'm supposed to be doing." Denies vision changes, speech difficulty, focal weakness, dizziness or falls.  Reports bug bites on R ankle few days ago Nothing there now. Was seen at urgent care for this. Was given Keflex for this. Red spots have since resolved.    IBS: diarrhea predominant. Occasionally has rash in groin.   HSV oral mucosa: more stressed lately. Recently refilled the Valtrex.   New BP medicine: fluid pill HCTZ. Switched to capsule By pharmacy and "could taste this in my mouth when it dissolved and it gave me a cough" so pharmacist switched out for tablets. Patient requests no further capsules of this medication.         Past medical, surgical, social and family history reviewed: Past Medical History:  Diagnosis Date  . Anxiety   . Back pain, chronic   . GERD (gastroesophageal reflux disease)   . Hemorrhoid   . Hypertension   . IBS (irritable bowel syndrome)   . Migraine   . Vertigo    Past Surgical History:  Procedure Laterality Date  . ABDOMINAL HYSTERECTOMY    . BREAST SURGERY    . HERNIA REPAIR    . KNEE ARTHROSCOPY Left   . LUMBAR DISC SURGERY    . MASTECTOMY    . TONSILLECTOMY    . TUBAL LIGATION     Social History    Substance Use Topics  . Smoking status: Never Smoker  . Smokeless tobacco: Never Used  . Alcohol use No   Family History  Problem Relation Age of Onset  . Hypertension Mother   . Stroke Father      Current medication list and allergy/intolerance information reviewed:   Current Outpatient Prescriptions  Medication Sig Dispense Refill  . ALPRAZolam (XANAX) 0.25 MG tablet TAKE ONE TABLET BY MOUTH AT BEDTIME AS NEEDED FOR ANXIETY 30 tablet 0  . BYSTOLIC 20 MG TABS TAKE TWO TABLETS BY MOUTH EVERY DAY 60 tablet 0  . clotrimazole-betamethasone (LOTRISONE) cream Apply 1 application topically 2 (two) times daily.    Marland Kitchen CRANBERRY PO Take 1 capsule by mouth daily.    . diclofenac sodium (VOLTAREN) 1 % GEL Apply 2 g topically 4 (four) times daily. To affected joint. 100 g 11  . diphenoxylate-atropine (LOMOTIL) 2.5-0.025 MG per tablet Take 1 tablet by mouth 4 (four) times daily as needed for diarrhea or loose stools.     . fluocinonide cream (LIDEX) 2.67 % Apply 1 application topically 2 (two) times daily.    . hydrochlorothiazide (HYDRODIURIL) 12.5 MG tablet Take 1 tablet (12.5 mg total) by mouth daily. 30 tablet 1  . hydrocortisone (ANUSOL-HC) 25 MG suppository Place 1 suppository (25 mg total) rectally  2 (two) times daily as needed for hemorrhoids or itching. 12 suppository 6  . ibuprofen (ADVIL,MOTRIN) 200 MG tablet Take 200 mg by mouth every 6 (six) hours as needed.    Marland Kitchen lisinopril (PRINIVIL,ZESTRIL) 40 MG tablet TAKE ONE TABLET BY MOUTH EVERY DAY 30 tablet 0  . loratadine (CLARITIN) 10 MG tablet Take 1 tablet (10 mg total) by mouth daily. 10 tablet 0  . meclizine (ANTIVERT) 25 MG tablet Take 1 tablet (25 mg total) by mouth 3 (three) times daily as needed for dizziness. 30 tablet 0  . Multiple Minerals-Vitamins (CALCIUM-MAGNESIUM-ZINC-D3 PO) Take 1 tablet by mouth 2 (two) times daily.    . Multiple Vitamin (MULTIVITAMIN) tablet Take 1 tablet by mouth daily.    Marland Kitchen omeprazole (PRILOSEC) 20 MG  capsule TAKE ONE CAPSULE BY MOUTH EVERY DAY 90 capsule 0  . OVER THE COUNTER MEDICATION Take 1 capsule by mouth daily. "Mega Red"    . Polyethylene Glycol 400 (BLINK TEARS OP) Place 1 drop into both eyes as needed (for dry eyes).    . sodium chloride (OCEAN) 0.65 % SOLN nasal spray Place 1 spray into both nostrils as needed for congestion. 15 mL 0  . triamcinolone cream (KENALOG) 0.1 % Apply to affected areas twice a day for up to two weeks, avoid face. 80 g 0  . valACYclovir (VALTREX) 500 MG tablet TAKE ONE TABLET BY MOUTH 2 TIMES A DAY. MAY REFILL IF NEW OUTBREAK OCCURS. 6 tablet 0   No current facility-administered medications for this visit.    Allergies: Multiple drug intolerances, no history of anaphylaxis. See list in chart.    Review of Systems:  Constitutional:  No  fever, no chills  HEENT: No  headache, no vision change  Cardiac: No  chest pain  Respiratory:  No  shortness of breath  Skin: No  Rash  Neurologic: No  weakness, No  dizziness, No  slurred speech/focal weakness/facial droop    Exam:  BP (!) 159/62   Pulse 65   Ht 5' 6.5" (1.689 m)   Wt 198 lb (89.8 kg)   BMI 31.48 kg/m   Constitutional: VS see above. General Appearance: alert, well-developed, well-nourished, NAD  Eyes: Normal lids and conjunctive, non-icteric sclera  Ears, Nose, Mouth, Throat: MMM, Normal external inspection ears/nares/mouth/lips/gums.  Neck: No masses, trachea midline. No thyroid enlargement. No tenderness/mass appreciated. No lymphadenopathy  Respiratory: Normal respiratory effort. no wheeze, no rhonchi, no rales  Cardiovascular: S1/S2 normal, no murmur, no rub/gallop auscultated. RRR. No lower extremity edema.   Gastrointestinal: No palpable hernia, some tenderness under right abdominal and is, some mild candida type erythema of skin in this location,  Musculoskeletal: Gait normal.  Neurological: Normal balance/coordination. No tremor.   Skin: warm, dry, intact.    Psychiatric: Normal judgment/insight. Normal mood and affect. Oriented x3.    ASSESSMENT/PLAN:   Essential hypertension - HCTZ tablets rather than capsules. Blood pressure isn't bad for her today. Multiple drug intolerances. - Plan: hydrochlorothiazide (HYDRODIURIL) 12.5 MG tablet, CBC with Differential/Platelet, COMPLETE METABOLIC PANEL WITH GFR, Hemoglobin A1c, Lipid panel, TSH  Onychomycosis - Declines podiatry referral at this time. Advised file down nails with disposable emery board's - Plan: Ciclopirox (CNL8 NAIL) 8 % KIT  Confusion and disorientation - Possibly due to stress/patient recently moved houses. Stroke precautions reviewed, reassured she is not having classic stroke symptoms here  Insect bite, subsequent encounter - Resolved, monitor  Rash and nonspecific skin eruption - Patient has cream for rash under her pannus  Cold sore - On antivirals, continue when necessary     Visit summary with medication list and pertinent instructions was printed for patient to review. All questions at time of visit were answered - patient instructed to contact office with any additional concerns. ER/RTC precautions were reviewed with the patient. Follow-up plan: Return in about 3 months (around 12/14/2016) for blood pressure recheck and routine follow-up.

## 2016-09-25 ENCOUNTER — Ambulatory Visit (INDEPENDENT_AMBULATORY_CARE_PROVIDER_SITE_OTHER): Payer: Medicare Other | Admitting: Osteopathic Medicine

## 2016-09-25 ENCOUNTER — Other Ambulatory Visit: Payer: Self-pay | Admitting: Osteopathic Medicine

## 2016-09-25 ENCOUNTER — Encounter: Payer: Self-pay | Admitting: Osteopathic Medicine

## 2016-09-25 VITALS — BP 186/64 | HR 59 | Ht 66.5 in | Wt 198.0 lb

## 2016-09-25 DIAGNOSIS — K0889 Other specified disorders of teeth and supporting structures: Secondary | ICD-10-CM | POA: Diagnosis not present

## 2016-09-25 DIAGNOSIS — R251 Tremor, unspecified: Secondary | ICD-10-CM

## 2016-09-25 DIAGNOSIS — M545 Low back pain, unspecified: Secondary | ICD-10-CM

## 2016-09-25 DIAGNOSIS — B351 Tinea unguium: Secondary | ICD-10-CM

## 2016-09-25 DIAGNOSIS — M25552 Pain in left hip: Secondary | ICD-10-CM

## 2016-09-25 DIAGNOSIS — G8929 Other chronic pain: Secondary | ICD-10-CM

## 2016-09-25 DIAGNOSIS — L989 Disorder of the skin and subcutaneous tissue, unspecified: Secondary | ICD-10-CM

## 2016-09-25 DIAGNOSIS — B353 Tinea pedis: Secondary | ICD-10-CM

## 2016-09-25 DIAGNOSIS — I10 Essential (primary) hypertension: Secondary | ICD-10-CM | POA: Diagnosis not present

## 2016-09-25 DIAGNOSIS — R252 Cramp and spasm: Secondary | ICD-10-CM

## 2016-09-25 DIAGNOSIS — Z23 Encounter for immunization: Secondary | ICD-10-CM | POA: Diagnosis not present

## 2016-09-25 MED ORDER — TERBINAFINE HCL 1 % EX CREA: 1.0000 "application " | TOPICAL_CREAM | Freq: Two times a day (BID) | CUTANEOUS | 1 refills | Status: DC

## 2016-09-25 NOTE — Addendum Note (Signed)
Addended by: Bo Mcclintock C on: 09/25/2016 01:50 PM   Modules accepted: Orders

## 2016-09-25 NOTE — Patient Instructions (Addendum)
We placed referral to podiatry - please let us know if you don't hear back.

## 2016-09-25 NOTE — Progress Notes (Addendum)
HPI: Dana Calderon is a 75 y.o. female  who presents to [facility] today, 09/25/16,  for chief complaint of:  Chief Complaint  Patient presents with  . Skin, muscle spasm, tremor, toe fungus, HTN, tooth    Multiple concerns today, patient has list of concerns.   Nail fungus: Patient recently given medication for this but has stopped using it because breathing in irritated her and she thinks it is making her left hand tremor worse   Tremor: following with neuro. Tremor states worse after using the Rx for the nail fungus. Has upcoming appointment with neurologist  Side of L cheek/face was hurting the other day, "felt funny" and went to ER. Felt like there was something going up her face. Was worried about a blood clot. Was discharged home.  Recently moved: Pain on back of L leg after moving. Pain in L hip. Lower back strain. Patient has declined physical therapy  HTN: feels like weight on her chest and hard to breathe. Stopped this medicine. Still taking the Lystolic and Lisinopril.   Gold-capped tooth, worried about connection between dental and cardiac disease. Following with dentist. Dana Calderon might have been due to teeth grinding, cannot tolerate mouthguard. Tooth Xrays showed "something" and she was referred to specialist.    Recent records reviewed: CT head recently normal 09/10/2016, MRI brain normal 07/11/2016. No imaging from recent ER visit for facial pain, pain has since resolved.  Past medical, surgical, social and family history reviewed: Past Medical History:  Diagnosis Date  . Anxiety   . Back pain, chronic   . GERD (gastroesophageal reflux disease)   . Hemorrhoid   . Hypertension   . IBS (irritable bowel syndrome)   . Migraine   . Vertigo    Past Surgical History:  Procedure Laterality Date  . ABDOMINAL HYSTERECTOMY    . BREAST SURGERY    . HERNIA REPAIR    . KNEE ARTHROSCOPY Left   . LUMBAR DISC SURGERY    . MASTECTOMY    .  TONSILLECTOMY    . TUBAL LIGATION     Social History  Substance Use Topics  . Smoking status: Never Smoker  . Smokeless tobacco: Never Used  . Alcohol use No   Family History  Problem Relation Age of Onset  . Hypertension Mother   . Stroke Father      Current medication list and allergy/intolerance information reviewed:   Current Outpatient Prescriptions  Medication Sig Dispense Refill  . ALPRAZolam (XANAX) 0.25 MG tablet TAKE ONE TABLET BY MOUTH AT BEDTIME AS NEEDED FOR ANXIETY 30 tablet 0  . BYSTOLIC 20 MG TABS TAKE TWO TABLETS BY MOUTH EVERY DAY 60 tablet 0  . CRANBERRY PO Take 1 capsule by mouth daily.    Marland Kitchen ibuprofen (ADVIL,MOTRIN) 200 MG tablet Take 200 mg by mouth every 6 (six) hours as needed.    Marland Kitchen lisinopril (PRINIVIL,ZESTRIL) 40 MG tablet TAKE ONE TABLET BY MOUTH EVERY DAY 30 tablet 0  . loratadine (CLARITIN) 10 MG tablet Take 1 tablet (10 mg total) by mouth daily. 10 tablet 0  . Multiple Minerals-Vitamins (CALCIUM-MAGNESIUM-ZINC-D3 PO) Take 1 tablet by mouth 2 (two) times daily.    . Multiple Vitamin (MULTIVITAMIN) tablet Take 1 tablet by mouth daily.    Marland Kitchen omeprazole (PRILOSEC) 20 MG capsule TAKE ONE CAPSULE BY MOUTH EVERY DAY 90 capsule 0  . OVER THE COUNTER MEDICATION Take 1 capsule by mouth daily. "Mega Red"    . Polyethylene Glycol  400 (BLINK TEARS OP) Place 1 drop into both eyes as needed (for dry eyes).    . sodium chloride (OCEAN) 0.65 % SOLN nasal spray Place 1 spray into both nostrils as needed for congestion. 15 mL 0  . Ciclopirox (CNL8 NAIL) 8 % KIT Apply 1 application topically 2 (two) times daily. (Patient not taking: Reported on 09/25/2016) 1 each 3  . clotrimazole-betamethasone (LOTRISONE) cream Apply 1 application topically 2 (two) times daily.    . diclofenac sodium (VOLTAREN) 1 % GEL Apply 2 g topically 4 (four) times daily. To affected joint. (Patient not taking: Reported on 09/25/2016) 100 g 11  . diphenoxylate-atropine (LOMOTIL) 2.5-0.025 MG per tablet  Take 1 tablet by mouth 4 (four) times daily as needed for diarrhea or loose stools.     . fluocinonide cream (LIDEX) 9.62 % Apply 1 application topically 2 (two) times daily.    . hydrochlorothiazide (HYDRODIURIL) 12.5 MG tablet Take 1 tablet (12.5 mg total) by mouth daily. (Patient not taking: Reported on 09/25/2016) 30 tablet 1  . hydrocortisone (ANUSOL-HC) 25 MG suppository Place 1 suppository (25 mg total) rectally 2 (two) times daily as needed for hemorrhoids or itching. (Patient not taking: Reported on 09/25/2016) 12 suppository 6  . meclizine (ANTIVERT) 25 MG tablet Take 1 tablet (25 mg total) by mouth 3 (three) times daily as needed for dizziness. (Patient not taking: Reported on 09/25/2016) 30 tablet 0  . terbinafine (LAMISIL) 1 % cream Apply 1 application topically 2 (two) times daily. To affected skin of feet 36 g 1  . triamcinolone cream (KENALOG) 0.1 % Apply to affected areas twice a day for up to two weeks, avoid face. (Patient not taking: Reported on 09/25/2016) 80 g 0  . valACYclovir (VALTREX) 500 MG tablet TAKE ONE TABLET BY MOUTH 2 TIMES A DAY. MAY REFILL IF NEW OUTBREAK OCCURS. (Patient not taking: Reported on 09/25/2016) 6 tablet 0   No current facility-administered medications for this visit.    Allergies: Multiple drug intolerances, no history of anaphylaxis. See list in chart.    Review of Systems:  Constitutional:  No  fever, no chills  HEENT: No  headache, no vision change  Cardiac: No  chest pain  Respiratory:  No  shortness of breath  Skin: No  Rash  Neurologic: No  weakness, No  dizziness, No  slurred speech/focal weakness/facial droop    Exam:  BP (!) 186/64   Pulse (!) 59   Ht 5' 6.5" (1.689 m)   Wt 198 lb (89.8 kg)   BMI 31.48 kg/m   Constitutional: VS see above. General Appearance: alert, well-developed, well-nourished, NAD  Eyes: Normal lids and conjunctive, non-icteric sclera  Ears, Nose, Mouth, Throat: MMM, Normal external inspection  ears/nares/mouth/lips/gums.  Neck: No masses, trachea midline.   Respiratory: Normal respiratory effort. no wheeze, no rhonchi, no rales  Cardiovascular: S1/S2 normal, no murmur, no rub/gallop auscultated. RRR. No lower extremity edema.   Musculoskeletal: Gait normal. Negative straight leg raise bilaterally. Negative Homans bilaterally. No tenderness over trochanteric bursa. No tenderness or swelling over muscles of mastication on the left  Neurological: Normal balance/coordination. + L hand tremor consistent with previous exams  Skin: warm, dry, no rash on leg where she was complaining of dry skin. Positive onychomycosis and some erythema/flakiness to the skin near the toes.     ASSESSMENT/PLAN:   Essential hypertension - unable to tolerate medications, risks versus benefits of non-treatment & side effects was discussed  Onychomycosis - refer to podiatry - Plan:  Ambulatory referral to Podiatry, terbinafine (LAMISIL) 1 % cream  Tooth ache - following with dentist  Pain of left hip joint - rehab exercises printed  Skin problem - reports all the medicine is drying her out. Emollient lotion advised  Chronic bilateral low back pain without sciatica - home exercises printed  Tremor - following with neurology  Muscle cramp - of face, resolved  Tinea pedis of both feet  Need for prophylactic vaccination and inoculation against influenza - Plan: Flu Vaccine QUAD 36+ mos IM     Visit summary with medication list and pertinent instructions was printed for patient to review. All questions at time of visit were answered - patient instructed to contact office with any additional concerns. ER/RTC precautions were reviewed with the patient. Follow-up plan: Return in about 3 months (around 12/26/2016) for ROUTINE CHECK-UP.   Total time spent 40 minutes, greater than 50% of the visit was counseling and coordinating care for diagnosis of The primary encounter diagnosis was Essential  hypertension. Diagnoses of Onychomycosis, Tooth ache, Pain of left hip joint, Skin problem, Chronic bilateral low back pain without sciatica, Tremor, Muscle cramp, and Tinea pedis of both feet were also pertinent to this visit. Marland Kitchen

## 2016-09-27 ENCOUNTER — Other Ambulatory Visit: Payer: Self-pay | Admitting: Osteopathic Medicine

## 2016-09-27 DIAGNOSIS — B009 Herpesviral infection, unspecified: Secondary | ICD-10-CM

## 2016-09-30 ENCOUNTER — Other Ambulatory Visit: Payer: Self-pay | Admitting: Osteopathic Medicine

## 2016-09-30 DIAGNOSIS — I10 Essential (primary) hypertension: Secondary | ICD-10-CM

## 2016-10-07 ENCOUNTER — Ambulatory Visit (INDEPENDENT_AMBULATORY_CARE_PROVIDER_SITE_OTHER): Payer: Medicare Other | Admitting: Podiatry

## 2016-10-07 ENCOUNTER — Encounter: Payer: Self-pay | Admitting: Podiatry

## 2016-10-07 VITALS — BP 224/103 | HR 70

## 2016-10-07 DIAGNOSIS — M79672 Pain in left foot: Secondary | ICD-10-CM | POA: Diagnosis not present

## 2016-10-07 DIAGNOSIS — B353 Tinea pedis: Secondary | ICD-10-CM | POA: Diagnosis not present

## 2016-10-07 DIAGNOSIS — M79671 Pain in right foot: Secondary | ICD-10-CM | POA: Diagnosis not present

## 2016-10-07 DIAGNOSIS — B351 Tinea unguium: Secondary | ICD-10-CM

## 2016-10-07 NOTE — Patient Instructions (Signed)
Seen for hard hypertrophic nails and red skin on right foot.  All nails debrided. Use Lamisil cream on right foot in between digits and place cotton ball in between to aerate. Use open toed shoes at home. Return in 3 months or sooner if needed.

## 2016-10-07 NOTE — Progress Notes (Signed)
SUBJECTIVE: 75 y.o. year old female presents complaining of painful thick nails and difficulty trimming.  She used Penlac that gave her allergic reaction, burning eyes, difficulty breathing from smell. She is also using Lamisil cream for right foot red skin.  Stated that her blood pressure is up today because she did not take her BP medication yet. She took them while she was in Exam room.   REVIEW OF SYSTEMS: Pertinent items noted in HPI and remainder of comprehensive ROS otherwise negative.  OBJECTIVE: DERMATOLOGIC EXAMINATION: Thick hard painful nails both great toes. Cracking skin with mild erythema 3rd and 4th web space right foot. No active drainage noted.   VASCULAR EXAMINATION OF LOWER LIMBS: Pedal pulses are not palpable except for left dorsalis pedis artery. Capillary Filling times within 3 seconds in all digits.  Mild forefoot edema on right foot. Temperature gradient from tibial crest to dorsum of foot is within normal bilateral.  NEUROLOGIC EXAMINATION OF THE LOWER LIMBS: Achilles DTR is present and within normal. Monofilament (Semmes-Weinstein 10-gm) sensory testing positive 6 out of 6, bilateral. Vibratory sensations(128Hz  turning fork) intact at medial and lateral forefoot bilateral.  Sharp and Dull discriminatory sensations at the plantar ball of hallux is intact bilateral.   MUSCULOSKELETAL EXAMINATION: Positive for bunion deformity bilateral. Tight interdigital spaces at lesser digits bilateral.   ASSESSMENT: Onychomycosis with painful nails. Tinea pedis right lesser interdigital spaces.  PLAN: Reviewed clinical findings and available treatment options. All nails debrided. Advised continue to use Lamisil cream and place Cotton balls in between digits on right foot affected area. Return in 3 months or sooner if needed.

## 2016-10-28 ENCOUNTER — Other Ambulatory Visit: Payer: Self-pay | Admitting: Osteopathic Medicine

## 2016-10-30 ENCOUNTER — Other Ambulatory Visit: Payer: Self-pay | Admitting: Osteopathic Medicine

## 2016-10-30 DIAGNOSIS — I10 Essential (primary) hypertension: Secondary | ICD-10-CM

## 2016-11-12 ENCOUNTER — Ambulatory Visit (INDEPENDENT_AMBULATORY_CARE_PROVIDER_SITE_OTHER): Payer: Medicare Other | Admitting: Osteopathic Medicine

## 2016-11-12 ENCOUNTER — Other Ambulatory Visit: Payer: Self-pay | Admitting: Osteopathic Medicine

## 2016-11-12 ENCOUNTER — Encounter: Payer: Self-pay | Admitting: Osteopathic Medicine

## 2016-11-12 VITALS — BP 173/84 | HR 73 | Ht 66.5 in | Wt 195.0 lb

## 2016-11-12 DIAGNOSIS — K089 Disorder of teeth and supporting structures, unspecified: Secondary | ICD-10-CM

## 2016-11-12 DIAGNOSIS — R6884 Jaw pain: Secondary | ICD-10-CM

## 2016-11-12 DIAGNOSIS — B009 Herpesviral infection, unspecified: Secondary | ICD-10-CM

## 2016-11-12 LAB — CBC WITH DIFFERENTIAL/PLATELET
BASOS ABS: 109 {cells}/uL (ref 0–200)
Basophils Relative: 1 %
EOS ABS: 218 {cells}/uL (ref 15–500)
EOS PCT: 2 %
HCT: 41.7 % (ref 35.0–45.0)
Hemoglobin: 13.9 g/dL (ref 11.7–15.5)
LYMPHS PCT: 28 %
Lymphs Abs: 3052 cells/uL (ref 850–3900)
MCH: 28.7 pg (ref 27.0–33.0)
MCHC: 33.3 g/dL (ref 32.0–36.0)
MCV: 86 fL (ref 80.0–100.0)
MONOS PCT: 7 %
MPV: 10.2 fL (ref 7.5–12.5)
Monocytes Absolute: 763 cells/uL (ref 200–950)
Neutro Abs: 6758 cells/uL (ref 1500–7800)
Neutrophils Relative %: 62 %
PLATELETS: 249 10*3/uL (ref 140–400)
RBC: 4.85 MIL/uL (ref 3.80–5.10)
RDW: 14.3 % (ref 11.0–15.0)
WBC: 10.9 10*3/uL — ABNORMAL HIGH (ref 3.8–10.8)

## 2016-11-12 NOTE — Progress Notes (Signed)
HPI: Dana Calderon is a 75 y.o. female  who presents to [place] today, 11/12/16,  for chief complaint of:  Chief Complaint  Patient presents with  . Sinus Problem     Patient initially thought maybe sinus issue, she is having some pain on the left maxillary sinus area but states actually most of her discomfort is located in left lower jaw, questions whether this is to do with dental pain/lump on the job. She has a small lump on the jaw on the left mandible, she says that this has been present and unchanged for several months, has undergone x-ray with her dentist who said there was an nonspecific shadow in that area and advised her to follow up with the oromaxillofacial surgeon but she has not followed up due to concern for cost. She had taken some left over Keflex. No fever or chills, no cough. Her main concern today is whether she should follow-up with the dentist's recommendation or if there is anything additional that I could do for this jaw discomfort    Past medical, surgical, social and family history reviewed: Patient Active Problem List   Diagnosis Date Noted  . Onychomycosis 09/13/2016  . Confusion and disorientation 09/13/2016  . Right ankle pain 07/23/2016  . Cough 02/22/2016  . Hyponatremia 02/22/2016  . 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 Chest pain 06/30/2015  . Anxiety 06/30/2015  . Mouth lesion 06/30/2015  . Essential hypertension 05/30/2015  . Prediabetes 05/30/2015  . Seborrheic dermatitis 05/30/2015   Past Surgical History:  Procedure Laterality Date  . ABDOMINAL HYSTERECTOMY    . BREAST SURGERY    . HERNIA REPAIR    . KNEE ARTHROSCOPY Left   . LUMBAR DISC SURGERY    . MASTECTOMY    . TONSILLECTOMY    . TUBAL LIGATION     Social History  Substance Use Topics  . Smoking status: Never Smoker  . Smokeless tobacco: Never Used  . Alcohol use No   Family History  Problem Relation Age of Onset  .  Hypertension Mother   . Stroke Father      Current medication list and allergy/intolerance information reviewed:   Current Outpatient Prescriptions on File Prior to Visit  Medication Sig Dispense Refill  . ALPRAZolam (XANAX) 0.25 MG tablet TAKE ONE TABLET BY MOUTH AT BEDTIME AS NEEDED FOR ANXIETY 30 tablet 0  . BYSTOLIC 20 MG TABS TAKE TWO TABLETS BY MOUTH EVERY DAY 60 tablet 0  . Ciclopirox (CNL8 NAIL) 8 % KIT Apply 1 application topically 2 (two) times daily. 1 each 3  . clotrimazole-betamethasone (LOTRISONE) cream Apply 1 application topically 2 (two) times daily.    Marland Kitchen CRANBERRY PO Take 1 capsule by mouth daily.    . diclofenac sodium (VOLTAREN) 1 % GEL Apply 2 g topically 4 (four) times daily. To affected joint. 100 g 11  . diphenoxylate-atropine (LOMOTIL) 2.5-0.025 MG per tablet Take 1 tablet by mouth 4 (four) times daily as needed for diarrhea or loose stools.     . fluocinonide cream (LIDEX) 8.12 % Apply 1 application topically 2 (two) times daily.    . hydrochlorothiazide (HYDRODIURIL) 12.5 MG tablet Take 1 tablet (12.5 mg total) by mouth daily. 30 tablet 1  . hydrocortisone (ANUSOL-HC) 25 MG suppository Place 1 suppository (25 mg total) rectally 2 (two) times daily as needed for hemorrhoids or itching. 12 suppository 6  . ibuprofen (ADVIL,MOTRIN) 200 MG tablet Take 200 mg by mouth every  6 (six) hours as needed.    Marland Kitchen lisinopril (PRINIVIL,ZESTRIL) 40 MG tablet TAKE ONE TABLET BY MOUTH EVERY DAY 30 tablet 0  . loratadine (CLARITIN) 10 MG tablet Take 1 tablet (10 mg total) by mouth daily. 10 tablet 0  . meclizine (ANTIVERT) 25 MG tablet Take 1 tablet (25 mg total) by mouth 3 (three) times daily as needed for dizziness. 30 tablet 0  . Multiple Minerals-Vitamins (CALCIUM-MAGNESIUM-ZINC-D3 PO) Take 1 tablet by mouth 2 (two) times daily.    . Multiple Vitamin (MULTIVITAMIN) tablet Take 1 tablet by mouth daily.    Marland Kitchen omeprazole (PRILOSEC) 20 MG capsule TAKE ONE CAPSULE BY MOUTH EVERY DAY 90  capsule 0  . OVER THE COUNTER MEDICATION Take 1 capsule by mouth daily. "Mega Red"    . Polyethylene Glycol 400 (BLINK TEARS OP) Place 1 drop into both eyes as needed (for dry eyes).    . sodium chloride (OCEAN) 0.65 % SOLN nasal spray Place 1 spray into both nostrils as needed for congestion. 15 mL 0  . terbinafine (LAMISIL) 1 % cream Apply 1 application topically 2 (two) times daily. To affected skin of feet 36 g 1  . triamcinolone cream (KENALOG) 0.1 % Apply to affected areas twice a day for up to two weeks, avoid face. 80 g 0  . valACYclovir (VALTREX) 500 MG tablet TAKE ONE TABLET BY MOUTH 2 TIMES A DAY. MAY REFILL IF NEW OUTBREAK OCCURS. 6 tablet 0   No current facility-administered medications on file prior to visit.    Allergies  Allergen Reactions  . Cefuroxime Axetil Shortness Of Breath    Has had cephalexin (Keflex) without side effects  . Edarbi [Azilsartan] Other (See Comments)    Heart pounding, foot swelling  . Methylprednisolone Other (See Comments) and Hypertension    Raised blood pressure and sugar  . Nystop [Nystatin] Other (See Comments)    Burning skin and breathing problems  . Penicillins Other (See Comments)    Face broke out in lumps  . Red Dye Itching, Rash and Other (See Comments)    Fainting  . Tamiflu [Oseltamivir Phosphate] Diarrhea and Other (See Comments)    Fainting  . Verelan [Verapamil Hcl Er] Shortness Of Breath  . Zithromax [Azithromycin] Shortness Of Breath  . Carbidopa-Levodopa Itching and Other (See Comments)    Itchy ears, throat and tongue   . Doxazosin Mesylate Other (See Comments)    Fast pulse  . Hctz [Hydrochlorothiazide] Other (See Comments)    Cannot tolerate  . Hyoscyamine Other (See Comments)    Shakes, dizzy and headache  . Influenza Vaccines Other (See Comments)    "head in a vice", nasal itching  . Other Other (See Comments)    Cardiolyte injection - burning through body  . Amlodipine Besylate Other (See Comments)    Unknown   . Asa [Aspirin] Rash  . Atenolol Other (See Comments)    Unknown  . Avelox [Moxifloxacin] Other (See Comments)    Unknown  . Benicar [Olmesartan] Other (See Comments)    Unknown  . Bentyl [Dicyclomine Hcl] Other (See Comments)    Headache and high BP  . Benzonatate Other (See Comments)    Unknown  . Bisoprolol Other (See Comments)    Unknown  . Cardizem [Diltiazem Hcl] Other (See Comments)    Unknown  . Carvedilol Other (See Comments)    Unknown  . Cefdinir Other (See Comments)    Unknown  . Chlorthalidone Other (See Comments)    Unknown  .  Ciprofloxacin Other (See Comments)    Heartburn  . Citalopram Other (See Comments)    Unknown  . Clonidine Derivatives Other (See Comments)    Unknown  . Diovan [Valsartan] Other (See Comments)    Unknown  . Doxycycline Other (See Comments)    Headache  . Erythromycin Other (See Comments)    Unknown  . Fioricet [Butalbital-Apap-Caffeine] Other (See Comments)    Headache and dizzy  . Flexeril [Cyclobenzaprine] Other (See Comments)    Unknown  . Furosemide Other (See Comments)    Unknown  . Gabapentin Other (See Comments)    Unknown  . Hydralazine Other (See Comments)    Headache  . Hydroxyzine Other (See Comments)    nightmares  . Isosorbide Other (See Comments)    Unknown  . Lyrica [Pregabalin] Other (See Comments)    Unknown  . Macrobid [employer] Macro] Other (See Comments)    Unknown  . Mavik [Trandolapril] Other (See Comments)    Unknown  . Metronidazole Itching and Other (See Comments)    Dizzy  . Naproxen Other (See Comments)    Unknown  . Neo-Polycin Hc [Bacitra-Neomycin-Polymyxin-Hc] Itching  . Nexium [Esomeprazole Magnesium] Other (See Comments)    Headache  . Norflex [Orphenadrine Citrate] Other (See Comments)  . Oxybutynin Chloride Er Other (See Comments)    Unknown  . Paxil [Paroxetine Hcl] Other (See Comments)    Unknown  . Phenazopyridine Other (See Comments)    Unknown  . Prazosin  Other (See Comments)    Unknown  . Prednisone Other (See Comments)    Unknown  . Promethazine Other (See Comments)    Unknown  . Prozac [Fluoxetine Hcl] Other (See Comments)    Unknown  . Spironolactone Other (See Comments)    Unknown  . Sulfur Other (See Comments)    Unknown  . Tizanidine Other (See Comments)    Unknown  . Valium [Diazepam] Other (See Comments)    "Too strong?"  . Zoloft [Sertraline Hcl] Other (See Comments)    Unknown      Review of Systems:  Constitutional: No fever/chills, +chronic fatigue  HEENT: No  headache, no vision change, see above re: sinus/jaw pain  Cardiac: No  chest pain, No  pressure  Respiratory:  No  shortness of breath. No  Cough  Gastrointestinal: No  abdominal pain, no change on bowel habits   Exam:  BP (!) 173/84   Pulse 73   Ht 5' 6.5" (1.689 m)   Wt 195 lb (88.5 kg)   BMI 31.00 kg/m   Constitutional: VS see above. General Appearance: alert, well-developed, well-nourished, NAD  Eyes: Normal lids and conjunctive, non-icteric sclera  Ears, Nose, Mouth, Throat: MMM, Normal external inspection ears/nares/mouth/lips/gums. Normal gums. Small palpable nontender bony nodule on her aspect of the left mandible/jaw. No overlying erythema/edema no pharyngeal exudate/erythema, nasal mucosa normal, tympanic memories normal bilaterally.  Neck: No masses, trachea midline.   Respiratory: Normal respiratory effort. no wheeze, no rhonchi, no rales  Cardiovascular: S1/S2 normal, no murmur, no rub/gallop auscultated. RRR.   Musculoskeletal: Gait normal. Symmetric and independent movement of all extremities  Neurological: Normal balance/coordination. No tremor.  Skin: warm, dry, intact.   Psychiatric: Normal judgment/insight. Normal mood and affect. Oriented x3.    ASSESSMENT/PLAN:   This does not appear to be any infection to me, palpable bony lump on her portion of left mandible but no lymph node enlargement, no erythema, gums and  teeth appear normal. Advised patient to discontinue antibiotic use, do  not start leftover antibiotics without consultation from me or other medical provider such as dentist. At this point, would get CBC to rule out no elevated white count, follow-up as her dentist recommendation  Tooth disorder - Plan: CBC with Differential/Platelet  Jaw pain - Plan: CBC with Differential/Platelet    Patient Instructions  Please follow-up for abnormal tooth/jaw finding as directed by your dentist. I do not see any concern for infection at this point. Can continue Ibuprofen for aches/pains, nasal spray for sinus congestion. If worse pain particularly if fever, swelling or redness, please seek emergency medical or dental care ASAP.     Visit summary with medication list and pertinent instructions was printed for patient to review. All questions at time of visit were answered - patient instructed to contact office with any additional concerns. ER/RTC precautions were reviewed with the patient. Follow-up plan: Return if symptoms worsen or fail to improve.

## 2016-11-12 NOTE — Patient Instructions (Signed)
Please follow-up for abnormal tooth/jaw finding as directed by your dentist. I do not see any concern for infection at this point. Can continue Ibuprofen for aches/pains, nasal spray for sinus congestion. If worse pain particularly if fever, swelling or redness, please seek emergency medical or dental care ASAP.

## 2016-11-19 ENCOUNTER — Other Ambulatory Visit: Payer: Self-pay | Admitting: Osteopathic Medicine

## 2016-12-03 ENCOUNTER — Other Ambulatory Visit: Payer: Self-pay | Admitting: Osteopathic Medicine

## 2016-12-03 DIAGNOSIS — I10 Essential (primary) hypertension: Secondary | ICD-10-CM

## 2016-12-20 ENCOUNTER — Ambulatory Visit: Payer: Medicare Other | Admitting: Osteopathic Medicine

## 2016-12-31 ENCOUNTER — Encounter: Payer: Self-pay | Admitting: *Deleted

## 2016-12-31 ENCOUNTER — Emergency Department (INDEPENDENT_AMBULATORY_CARE_PROVIDER_SITE_OTHER)
Admission: EM | Admit: 2016-12-31 | Discharge: 2016-12-31 | Disposition: A | Discharge status: Home / Self Care | Payer: Medicare Other | Source: Home / Self Care | Attending: Emergency Medicine | Admitting: Emergency Medicine

## 2016-12-31 DIAGNOSIS — I10 Essential (primary) hypertension: Secondary | ICD-10-CM

## 2016-12-31 MED ORDER — HYDROCHLOROTHIAZIDE 12.5 MG PO TABS: ORAL_TABLET | ORAL | 0 refills | Status: DC

## 2016-12-31 NOTE — ED Triage Notes (Signed)
Pt c/o elevated BP x 2 days. BP 213/110 at home just before arrival.

## 2017-01-01 ENCOUNTER — Telehealth: Payer: Self-pay | Admitting: Physician Assistant

## 2017-01-01 ENCOUNTER — Telehealth: Payer: Self-pay | Admitting: Osteopathic Medicine

## 2017-01-01 ENCOUNTER — Other Ambulatory Visit: Payer: Self-pay | Admitting: Osteopathic Medicine

## 2017-01-01 DIAGNOSIS — I10 Essential (primary) hypertension: Secondary | ICD-10-CM

## 2017-01-01 NOTE — Telephone Encounter (Signed)
Fine w/me-

## 2017-01-01 NOTE — Telephone Encounter (Signed)
Thanks, Dana Calderon. I look forward to meeting her.

## 2017-01-01 NOTE — ED Provider Notes (Signed)
CSN: [id number]     Arrival date & time 12/31/16  1824 History   None    Chief Complaint  Patient presents with  . Hypertension   (Consider location/radiation/quality/duration/timing/severity/associated sxs/prior Treatment) The history is provided by the patient. No language interpreter was used.  Hypertension  This is a new problem. The problem occurs constantly. The problem has been gradually worsening. Pertinent negatives include no chest pain and no shortness of breath. Nothing aggravates the symptoms. Nothing relieves the symptoms. She has tried nothing for the symptoms. The treatment provided no relief.   Pt is concerned about elevated blood pressure.  Pt reports she is taking her medications.  Pt admits to anxiety and blood pressure is causing more anxiety.Pt reports she is allergic to multiple medications.  Past Medical History:  Diagnosis Date  . Anxiety   . Back pain, chronic   . GERD (gastroesophageal reflux disease)   . Hemorrhoid   . Hypertension   . IBS (irritable bowel syndrome)   . Migraine   . Vertigo    Past Surgical History:  Procedure Laterality Date  . ABDOMINAL HYSTERECTOMY    . BREAST SURGERY    . HERNIA REPAIR    . KNEE ARTHROSCOPY Left   . LUMBAR DISC SURGERY    . MASTECTOMY    . TONSILLECTOMY    . TUBAL LIGATION     Family History  Problem Relation Age of Onset  . Hypertension Mother   . Stroke Father    Social History  Substance Use Topics  . Smoking status: Never Smoker  . Smokeless tobacco: Never Used  . Alcohol use No   OB History    No data available     Review of Systems  Respiratory: Negative for shortness of breath.   Cardiovascular: Negative for chest pain.  All other systems reviewed and are negative.   Allergies  Cefuroxime axetil; Edarbi [azilsartan]; Methylprednisolone; Nystop [nystatin]; Penicillins; Red dye; Tamiflu [oseltamivir phosphate]; Verelan [verapamil hcl er]; Zithromax [azithromycin]; Carbidopa-levodopa;  Doxazosin mesylate; Hctz [hydrochlorothiazide]; Hyoscyamine; Influenza vaccines; Other; Amlodipine besylate; Asa [aspirin]; Atenolol; Avelox [moxifloxacin]; Benicar [olmesartan]; Bentyl [dicyclomine hcl]; Benzonatate; Bisoprolol; Cardizem [diltiazem hcl]; Carvedilol; Cefdinir; Chlorthalidone; Ciprofloxacin; Citalopram; Clonidine derivatives; Diovan [valsartan]; Doxycycline; Erythromycin; Fioricet [butalbital-apap-caffeine]; Flexeril [cyclobenzaprine]; Furosemide; Gabapentin; Hydralazine; Hydroxyzine; Isosorbide; Lyrica [pregabalin]; Macrobid [nitrofurantoin monohyd macro]; Mavik [trandolapril]; Metronidazole; Naproxen; Neo-polycin hc [bacitra-neomycin-polymyxin-hc]; Nexium [esomeprazole magnesium]; Norflex [orphenadrine citrate]; Oxybutynin chloride er; Paxil [paroxetine hcl]; Phenazopyridine; Prazosin; Prednisone; Promethazine; Prozac [fluoxetine hcl]; Spironolactone; Sulfur; Tizanidine; Valium [diazepam]; and Zoloft [sertraline hcl]  Home Medications   Prior to Admission medications   Medication Sig Start Date End Date Taking? Authorizing Provider  ALPRAZolam Duanne Moron) 0.25 MG tablet TAKE ONE TABLET BY MOUTH AT BEDTIME AS NEEDED FOR ANXIETY 12/03/16   Emeterio Reeve, DO  BYSTOLIC 20 MG TABS TAKE TWO TABLETS BY MOUTH EVERY DAY 11/19/16   Emeterio Reeve, DO  Ciclopirox (CNL8 NAIL) 8 % KIT Apply 1 application topically 2 (two) times daily. 09/13/16   Emeterio Reeve, DO  clotrimazole-betamethasone (LOTRISONE) cream Apply 1 application topically 2 (two) times daily.    Historical Provider, MD  CRANBERRY PO Take 1 capsule by mouth daily.    Historical Provider, MD  diclofenac sodium (VOLTAREN) 1 % GEL Apply 2 g topically 4 (four) times daily. To affected joint. 07/23/16   Gregor Hams, MD  diphenoxylate-atropine (LOMOTIL) 2.5-0.025 MG per tablet Take 1 tablet by mouth 4 (four) times daily as needed for diarrhea or loose stools.     Historical Provider, MD  fluocinonide cream (LIDEX) 5.40 % Apply 1  application topically 2 (two) times daily.    Historical Provider, MD  hydrochlorothiazide (HYDRODIURIL) 12.5 MG tablet Take one tablet a day 12/31/16   Fransico Meadow, PA-C  hydrocortisone (ANUSOL-HC) 25 MG suppository Place 1 suppository (25 mg total) rectally 2 (two) times daily as needed for hemorrhoids or itching. 07/08/16   Emeterio Reeve, DO  ibuprofen (ADVIL,MOTRIN) 200 MG tablet Take 200 mg by mouth every 6 (six) hours as needed.    Historical Provider, MD  lisinopril (PRINIVIL,ZESTRIL) 40 MG tablet TAKE ONE TABLET BY MOUTH EVERY DAY 01/01/17   Emeterio Reeve, DO  loratadine (CLARITIN) 10 MG tablet Take 1 tablet (10 mg total) by mouth daily. 07/04/16   Duffy Bruce, MD  meclizine (ANTIVERT) 25 MG tablet Take 1 tablet (25 mg total) by mouth 3 (three) times daily as needed for dizziness. 07/08/16   Emeterio Reeve, DO  Multiple Minerals-Vitamins (CALCIUM-MAGNESIUM-ZINC-D3 PO) Take 1 tablet by mouth 2 (two) times daily.    Historical Provider, MD  Multiple Vitamin (MULTIVITAMIN) tablet Take 1 tablet by mouth daily.    Historical Provider, MD  omeprazole (PRILOSEC) 20 MG capsule TAKE ONE CAPSULE BY MOUTH EVERY DAY 10/29/16   Emeterio Reeve, DO  OVER THE COUNTER MEDICATION Take 1 capsule by mouth daily. "Mega Red"    Historical Provider, MD  Polyethylene Glycol 400 (BLINK TEARS OP) Place 1 drop into both eyes as needed (for dry eyes).    Historical Provider, MD  sodium chloride (OCEAN) 0.65 % SOLN nasal spray Place 1 spray into both nostrils as needed for congestion. 07/04/16   Duffy Bruce, MD  terbinafine (LAMISIL) 1 % cream Apply 1 application topically 2 (two) times daily. To affected skin of feet 09/25/16   Emeterio Reeve, DO  triamcinolone cream (KENALOG) 0.1 % Apply to affected areas twice a day for up to two weeks, avoid face. 06/05/16 06/05/17  Sean Hommel, DO  valACYclovir (VALTREX) 500 MG tablet TAKE ONE TABLET BY MOUTH 2 TIMES A DAY. MAY REFILL IF NEW OUTBREAK OCCURS. 11/13/16    Emeterio Reeve, DO   Meds Ordered and Administered this Visit  Medications - No data to display  BP 189/96 (BP Location: Left Arm)   Pulse 63   Temp 98.3 F (36.8 C) (Oral)   Resp 18   Ht 5' 6.5" (1.689 m)   Wt 197 lb (89.4 kg)   SpO2 96%   BMI 31.32 kg/m  No data found.   Physical Exam  Constitutional: She is oriented to person, place, and time. She appears well-developed and well-nourished.  HENT:  Head: Normocephalic.  Eyes: EOM are normal.  Neck: Normal range of motion.  Cardiovascular: Normal rate and regular rhythm.   Pulmonary/Chest: Effort normal and breath sounds normal.  Abdominal: She exhibits no distension.  Musculoskeletal: Normal range of motion.  Neurological: She is alert and oriented to person, place, and time.  Psychiatric: She has a normal mood and affect.  Nursing note and vitals reviewed.   Urgent Care Course     Procedures (including critical care time)  Labs Review Labs Reviewed - No data to display  Imaging Review No results found.   Visual Acuity Review  Right Eye Distance:   Left Eye Distance:   Bilateral Distance:    Right Eye Near:   Left Eye Near:    Bilateral Near:         MDM  Pt has elevated blood pressure and she has her journal  showing elevated blood pressures.  I reviewed allergy list, when I compaired with antihypertensive medications.  Pt is allergic to at least one drug in every category.  Her allergy to hctz is feeling dry.  I advised her I think we can assume that is a normal side effect and not an allergy.  I will try her on 12.5 of hctz.  I advised to her to follow up with her primary.  I believe this pt's blood pressure will be extremely difficult to mange due to her allergy list.     1. Hypertension, unspecified type    Meds ordered this encounter  Medications  . hydrochlorothiazide (HYDRODIURIL) 12.5 MG tablet    Sig: Take one tablet a day    Dispense:  30 tablet    Refill:  0    Order Specific  Question:   Supervising Provider    Answer:   Burnett Harry, DAVID [[id number]]   An After Visit Summary was printed and given to the patient.     [place], PA-C 01/01/17 [phone]

## 2017-01-01 NOTE — Telephone Encounter (Signed)
Patient called had been a prevs pt of Dr. Lajoyce Lauber for a long time and changed to [facility] due to Dr. Ileene Rubens leaving the practice. Patient stated she feels that Lanelle Bal does not listen to her and almost feels like she is making fun of her for having so many allergies and she is very uncomfortable seeing her. Pt states her bp has been very high but has put off coming for an appt because she doesn't want to see Dr. Sheppard Coil? Asked to change providers to Dr. Georgina Snell but I adv he is not accepting pt's and offered you or Jade and she asked me and I said you I've heard that you are very thorough and really good. Thanks

## 2017-01-01 NOTE — Telephone Encounter (Signed)
Patient called request to change providers from Natalie to Charley. Thanks °

## 2017-01-06 ENCOUNTER — Ambulatory Visit: Payer: Medicare Other | Admitting: Podiatry

## 2017-01-06 ENCOUNTER — Ambulatory Visit (INDEPENDENT_AMBULATORY_CARE_PROVIDER_SITE_OTHER): Payer: Medicare Other | Admitting: Physician Assistant

## 2017-01-06 VITALS — BP 177/101 | HR 66 | Temp 98.2°F | Wt 194.0 lb

## 2017-01-06 DIAGNOSIS — M8580 Other specified disorders of bone density and structure, unspecified site: Secondary | ICD-10-CM | POA: Insufficient documentation

## 2017-01-06 DIAGNOSIS — R7303 Prediabetes: Secondary | ICD-10-CM

## 2017-01-06 DIAGNOSIS — I10 Essential (primary) hypertension: Secondary | ICD-10-CM

## 2017-01-06 DIAGNOSIS — R002 Palpitations: Secondary | ICD-10-CM | POA: Diagnosis not present

## 2017-01-06 DIAGNOSIS — L304 Erythema intertrigo: Secondary | ICD-10-CM | POA: Diagnosis not present

## 2017-01-06 DIAGNOSIS — R9431 Abnormal electrocardiogram [ECG] [EKG]: Secondary | ICD-10-CM

## 2017-01-06 DIAGNOSIS — L853 Xerosis cutis: Secondary | ICD-10-CM

## 2017-01-06 LAB — POCT GLYCOSYLATED HEMOGLOBIN (HGB A1C): Hemoglobin A1C: 6

## 2017-01-06 MED ORDER — MICONAZOLE NITRATE 2 % EX POWD: Freq: Two times a day (BID) | CUTANEOUS | 3 refills | Status: DC | PRN

## 2017-01-06 NOTE — Progress Notes (Signed)
HPI:                                                                Dana Calderon is a 76 y.o. female who presents to [facility]: [place] today for hypertension follow-up  This is a new patient to me. Patient switching from another provider in our practice.  Current concerns today include: dry skin and palpitations  HTN: patient was started on HCTZ 12.56m by urgent care on 12/31/16. She also takes Lisinopril 434VQand Bystolic 225ZD States she is only taking a half tab of HCTZ because it makes her "gassy." Patient has been prescribed multiple anithypertensives in the past and has intolerances to Amlodipine, Atenolol, Bisoprolol, Cardizem, Carvedilol, Chlorthalidone, Clonidine, Diovan, Furosemide, Spironolactone, and Prazosin. She checks BP's at home. BP range is 130's-170's/70's-90's. Denies vision change, headache, chest pain, dyspnea, lightheadedness, and edema.   Palpitations: patient states she called EMS last week because she woke up in the middle of the night with palpitations. Denied chest pain, dyspnea, nausea, or diaphoresis. She states that she sat in the truck, they checked her, but she declined to go to the ER because she thought they would dismiss her as having anxiety. Reports she went back in her home, took her Xanax and was able to sleep.  Past Medical History:  Diagnosis Date  . Anxiety   . Back pain, chronic   . GERD (gastroesophageal reflux disease)   . Hemorrhoid   . Hypertension   . IBS (irritable bowel syndrome)   . Migraine   . Vertigo    Past Surgical History:  Procedure Laterality Date  . ABDOMINAL HYSTERECTOMY    . BREAST SURGERY    . HERNIA REPAIR    . KNEE ARTHROSCOPY Left   . LUMBAR DISC SURGERY    . MASTECTOMY    . TONSILLECTOMY    . TUBAL LIGATION     Social History  Substance Use Topics  . Smoking status: Never Smoker  . Smokeless tobacco: Never Used  . Alcohol use No   family history includes  Hypertension in her mother; Stroke in her father.  Review of Systems  Constitutional: Negative for chills, fever, malaise/fatigue and weight loss.  HENT: Positive for congestion.   Eyes: Positive for blurred vision (wears corrective lenses).  Respiratory: Negative.   Cardiovascular: Positive for palpitations. Negative for chest pain.  Gastrointestinal: Positive for diarrhea. Negative for blood in stool.  Genitourinary: Negative.   Musculoskeletal: Negative for falls.  Skin: Positive for itching (and "dry").  Neurological: Negative.   Psychiatric/Behavioral: The patient is nervous/anxious.      Medications: Current Outpatient Prescriptions  Medication Sig Dispense Refill  . ALPRAZolam (XANAX) 0.25 MG tablet TAKE ONE TABLET BY MOUTH AT BEDTIME AS NEEDED FOR ANXIETY 30 tablet 0  . BYSTOLIC 20 MG TABS TAKE TWO TABLETS BY MOUTH EVERY DAY 60 tablet 0  . Ciclopirox (CNL8 NAIL) 8 % KIT Apply 1 application topically 2 (two) times daily. 1 each 3  . CRANBERRY PO Take 1 capsule by mouth daily.    . diclofenac sodium (VOLTAREN) 1 % GEL Apply 2 g topically 4 (four) times daily. To affected joint. 100 g 11  . diphenoxylate-atropine (LOMOTIL) 2.5-0.025 MG per tablet Take 1 tablet  by mouth 4 (four) times daily as needed for diarrhea or loose stools.     . fluocinonide cream (LIDEX) 6.28 % Apply 1 application topically 2 (two) times daily.    . hydrochlorothiazide (HYDRODIURIL) 12.5 MG tablet Take one tablet a day 30 tablet 0  . hydrocortisone (ANUSOL-HC) 25 MG suppository Place 1 suppository (25 mg total) rectally 2 (two) times daily as needed for hemorrhoids or itching. 12 suppository 6  . lisinopril (PRINIVIL,ZESTRIL) 40 MG tablet TAKE ONE TABLET BY MOUTH EVERY DAY 30 tablet 0  . loratadine (CLARITIN) 10 MG tablet Take 1 tablet (10 mg total) by mouth daily. 10 tablet 0  . meclizine (ANTIVERT) 25 MG tablet Take 1 tablet (25 mg total) by mouth 3 (three) times daily as needed for dizziness. 30 tablet 0   . Multiple Minerals-Vitamins (CALCIUM-MAGNESIUM-ZINC-D3 PO) Take 1 tablet by mouth 2 (two) times daily.    . Multiple Vitamin (MULTIVITAMIN) tablet Take 1 tablet by mouth daily.    Marland Kitchen omeprazole (PRILOSEC) 20 MG capsule TAKE ONE CAPSULE BY MOUTH EVERY DAY 90 capsule 0  . OVER THE COUNTER MEDICATION Take 1 capsule by mouth daily. "Mega Red"    . Polyethylene Glycol 400 (BLINK TEARS OP) Place 1 drop into both eyes as needed (for dry eyes).    . sodium chloride (OCEAN) 0.65 % SOLN nasal spray Place 1 spray into both nostrils as needed for congestion. 15 mL 0  . valACYclovir (VALTREX) 500 MG tablet TAKE ONE TABLET BY MOUTH 2 TIMES A DAY. MAY REFILL IF NEW OUTBREAK OCCURS. 6 tablet 0  . miconazole (MICOTIN) 2 % powder Apply topically 2 (two) times daily as needed for itching. 70 g 3   No current facility-administered medications for this visit.    Allergies  Allergen Reactions  . Cefuroxime Axetil Shortness Of Breath    Has had cephalexin (Keflex) without side effects  . Edarbi [Azilsartan] Other (See Comments)    Heart pounding, foot swelling  . Methylprednisolone Other (See Comments) and Hypertension    Raised blood pressure and sugar  . Nystop [Nystatin] Other (See Comments)    Burning skin and breathing problems  . Penicillins Other (See Comments)    Face broke out in lumps  . Red Dye Itching, Rash and Other (See Comments)    Fainting  . Tamiflu [Oseltamivir Phosphate] Diarrhea and Other (See Comments)    Fainting  . Verelan [Verapamil Hcl Er] Shortness Of Breath  . Zithromax [Azithromycin] Shortness Of Breath  . Carbidopa-Levodopa Itching and Other (See Comments)    Itchy ears, throat and tongue   . Doxazosin Mesylate Other (See Comments)    Fast pulse  . Hctz [Hydrochlorothiazide] Other (See Comments)    Cannot tolerate  . Hyoscyamine Other (See Comments)    Shakes, dizzy and headache  . Influenza Vaccines Other (See Comments)    "head in a vice", nasal itching  . Other Other  (See Comments)    Cardiolyte injection - burning through body  . Amlodipine Besylate Other (See Comments)    Unknown  . Asa [Aspirin] Rash  . Atenolol Other (See Comments)    Unknown  . Avelox [Moxifloxacin] Other (See Comments)    Unknown  . Benicar [Olmesartan] Other (See Comments)    Unknown  . Bentyl [Dicyclomine Hcl] Other (See Comments)    Headache and high BP  . Benzonatate Other (See Comments)    Unknown  . Bisoprolol Other (See Comments)    Unknown  . Cardizem [Diltiazem  Hcl] Other (See Comments)    Unknown  . Carvedilol Other (See Comments)    Unknown  . Cefdinir Other (See Comments)    Unknown  . Chlorthalidone Other (See Comments)    Unknown  . Ciprofloxacin Other (See Comments)    Heartburn  . Citalopram Other (See Comments)    Unknown  . Clonidine Derivatives Other (See Comments)    Unknown  . Diovan [Valsartan] Other (See Comments)    Unknown  . Doxycycline Other (See Comments)    Headache  . Erythromycin Other (See Comments)    Unknown  . Fioricet [Butalbital-Apap-Caffeine] Other (See Comments)    Headache and dizzy  . Flexeril [Cyclobenzaprine] Other (See Comments)    Unknown  . Furosemide Other (See Comments)    Unknown  . Gabapentin Other (See Comments)    Unknown  . Hydralazine Other (See Comments)    Headache  . Hydroxyzine Other (See Comments)    nightmares  . Isosorbide Other (See Comments)    Unknown  . Lyrica [Pregabalin] Other (See Comments)    Unknown  . Macrobid [employer] Macro] Other (See Comments)    Unknown  . Mavik [Trandolapril] Other (See Comments)    Unknown  . Metronidazole Itching and Other (See Comments)    Dizzy  . Naproxen Other (See Comments)    Unknown  . Neo-Polycin Hc [Bacitra-Neomycin-Polymyxin-Hc] Itching  . Nexium [Esomeprazole Magnesium] Other (See Comments)    Headache  . Norflex [Orphenadrine Citrate] Other (See Comments)  . Oxybutynin Chloride Er Other (See Comments)    Unknown  . Paxil  [Paroxetine Hcl] Other (See Comments)    Unknown  . Phenazopyridine Other (See Comments)    Unknown  . Prazosin Other (See Comments)    Unknown  . Prednisone Other (See Comments)    Unknown  . Promethazine Other (See Comments)    Unknown  . Prozac [Fluoxetine Hcl] Other (See Comments)    Unknown  . Spironolactone Other (See Comments)    Unknown  . Sulfur Other (See Comments)    Unknown  . Tizanidine Other (See Comments)    Unknown  . Valium [Diazepam] Other (See Comments)    "Too strong?"  . Zoloft [Sertraline Hcl] Other (See Comments)    Unknown       Objective:  BP (!) 177/101   Pulse 66   Temp 98.2 F (36.8 C) (Oral)   Wt 194 lb (88 kg)   BMI 30.84 kg/m  Gen: well-groomed, cooperative, not ill-appearing, no distress HEENT: wearing glasses, normal conjunctiva, TM's clear, oropharynx clear, moist mucus membranes Pulm: Normal work of breathing, normal phonation, clear to auscultation bilaterally, no wheezes, rales or rhonchi CV: Normal rate, regular rhythm, s1 and s2 distinct, no murmurs, clicks or rubs, no carotid bruit, no peripheral edema GI: soft, obese, nondistended, nontender Neuro: alert and oriented x 3, EOM's intact Skin: warm and dry, no rashes or lesions on exposed skin, no cyanosis Psych: appropriate affect, pleasant mood, normal speech and thought content  I personally reviewed the ECG from 07/2016 which shows sinus rhythm with varying QRS morphology, wide QTC at 474, and RBBB  Assessment and Plan: 76 y.o. female with  Uncontrolled HTN - unfortunately due to patient's sensitivities to medication and somatization, her blood pressure has been difficult to control. In reviewing flowsheet, historical BP's have been out of range at clinic and patient's self-reported pressures are erratic - cont HCTZ. Encouraged titrate to full tab daily.  - cont Bystolic 39QZ, and Lisinopril  41m daily - cont to check pressures at home   Intertrigo - miconazole (MICOTIN)  2 % powder; Apply topically 2 (two) times daily as needed for itching.  Dispense: 70 g; Refill: 3  Xerosis of skin - no soaps, use gentle cleansers such as Cetaphil when you shower - gently towel dry - apply Cetaphil cream after bathing - Capsaicin cream applied to the itchy areas nightly. You may experience a brief burning sensation  Osteopenia, unspecified location - DG Bone Density; Future - daily calcium and vitamin d supplementation  Palpitations, Abnormal ECG - checking TSH and electrolytes today - ECHO ordered. Patient had a previous ECHO on 12/09/13, but I am unable to view the report  - Ambulatory referral to Cardiology placed  Prediabetes -HgbA1c  Patient education and anticipatory guidance given Patient agrees with treatment plan Follow-up in 4 weeks  I spent 40 minutes with this patient, greater than 50% was face-to-face time counseling regarding the above diagnoses  CDarlyne RussianPA-C

## 2017-01-06 NOTE — Patient Instructions (Addendum)
For your dry skin: - no soaps, use gentle cleansers such as Cetaphil when you shower - gently towel dry - apply Cetaphil cream after bathing - Capsaicin cream applied to the itchy areas nightly. You may experience a brief burning sensation - apply the new prescription powder to the skin fold ( this rash is called intertrigo. It is a mild fungal infection)  For your heart: - I referred you to see Dr. Kirk Ruths (downstairs) - I have ordered some blood work - I also ordered an ultrasound of your heart (echocardiogram)  Sleep Hygiene . Limiting daytime naps to 30 minutes . Napping does not make up for inadequate nighttime sleep. However, a short nap of 20-30 minutes can help to improve mood, alertness and performance.  . Avoiding stimulants such as  caffeine and nicotine close to bedtime.  And when it comes to alcohol, moderation is key 4. While alcohol is well-known to help you fall asleep faster, too much close to bedtime can disrupt sleep in the second half of the night as the body begins to process the alcohol.    . Exercising to promote good quality sleep.  As little as 10 minutes of aerobic exercise, such as walking or cycling, can drastically improve nighttime sleep quality.  For the best night's sleep, most people should avoid strenuous workouts close to bedtime. However, the effect of intense nighttime exercise on sleep differs from person to person, so find out what works best for you.   . Steering clear of food that can be disruptive right before sleep.   Heavy or rich foods, fatty or fried meals, spicy dishes, citrus fruits, and carbonated drinks can trigger indigestion for some people. When this occurs close to bedtime, it can lead to painful heartburn that disrupts sleep. . Ensuring adequate exposure to natural light.  This is particularly important for individuals who may not venture outside frequently. Exposure to sunlight during the day, as well as darkness at night, helps to  maintain a healthy sleep-wake cycle . Marland Kitchen Establishing a regular relaxing bedtime routine.  A regular nightly routine helps the body recognize that it is bedtime. This could include taking warm shower or bath, reading a book, or light stretches. When possible, try to avoid emotionally upsetting conversations and activities before attempting to sleep. . Making sure that the sleep environment is pleasant.  Mattress and pillows should be comfortable. The bedroom should be cool - between 60 and 67 degrees - for optimal sleep. Bright light from lamps, cell phone and TV screens can make it difficult to fall asleep4, so turn those light off or adjust them when possible. Consider using blackout curtains, eye shades, ear plugs, "white noise" machines, humidifiers, fans and other devices that can make the bedroom more relaxing.  Prediabetes Eating Plan (your A1c looked good today) Prediabetes-also called impaired glucose tolerance or impaired fasting glucose-is a condition that causes blood sugar (blood glucose) levels to be higher than normal. Following a healthy diet can help to keep prediabetes under control. It can also help to lower the risk of type 2 diabetes and heart disease, which are increased in people who have prediabetes. Along with regular exercise, a healthy diet: Promotes weight loss. Helps to control blood sugar levels. Helps to improve the way that the body uses insulin. What do I need to know about this eating plan? Use the glycemic index (GI) to plan your meals. The index tells you how quickly a food will raise your blood sugar. Choose  low-GI foods. These foods take a longer time to raise blood sugar. Pay close attention to the amount of carbohydrates in the food that you eat. Carbohydrates increase blood sugar levels. Keep track of how many calories you take in. Eating the right amount of calories will help you to achieve a healthy weight. Losing about 7 percent of your starting weight can help  to prevent type 2 diabetes. You may want to follow a Mediterranean diet. This diet includes a lot of vegetables, lean meats or fish, whole grains, fruits, and healthy oils and fats. What foods can I eat? Grains  Whole grains, such as whole-wheat or whole-grain breads, crackers, cereals, and pasta. Unsweetened oatmeal. Bulgur. Barley. Quinoa. Brown rice. Corn or whole-wheat flour tortillas or taco shells. Vegetables  Lettuce. Spinach. Peas. Beets. Cauliflower. Cabbage. Broccoli. Carrots. Tomatoes. Squash. Eggplant. Herbs. Peppers. Onions. Cucumbers. Brussels sprouts. Fruits  Berries. Bananas. Apples. Oranges. Grapes. Papaya. Mango. Pomegranate. Kiwi. Grapefruit. Cherries. Meats and Other Protein Sources  Seafood. Lean meats, such as chicken and [place] or lean cuts of pork and beef. Tofu. Eggs. Nuts. Beans. Dairy  Low-fat or fat-free dairy products, such as yogurt, cottage cheese, and cheese. Beverages  Water. Tea. Coffee. Sugar-free or diet soda. Seltzer water. Milk. Milk alternatives, such as soy or almond milk. Condiments  Mustard. Relish. Low-fat, low-sugar ketchup. Low-fat, low-sugar barbecue sauce. Low-fat or fat-free mayonnaise. Sweets and Desserts  Sugar-free or low-fat pudding. Sugar-free or low-fat ice cream and other frozen treats. Fats and Oils  Avocado. Walnuts. Olive oil. The items listed above may not be a complete list of recommended foods or beverages. Contact your dietitian for more options.  What foods are not recommended? Grains  Refined white flour and flour products, such as bread, pasta, snack foods, and cereals. Beverages  Sweetened drinks, such as sweet iced tea and soda. Sweets and Desserts  Baked goods, such as cake, cupcakes, pastries, cookies, and cheesecake. The items listed above may not be a complete list of foods and beverages to avoid. Contact your dietitian for more information.  This information is not intended to replace advice given to you by your  health care provider. Make sure you discuss any questions you have with your health care provider. Document Released: 04/04/2015 Document Revised: 04/25/2016 Document Reviewed: 12/14/2014 Elsevier Interactive Patient Education  2017 [employer].

## 2017-01-07 LAB — COMPLETE METABOLIC PANEL WITH GFR
ALBUMIN: 4.1 g/dL (ref 3.6–5.1)
ALK PHOS: 71 U/L (ref 33–130)
ALT: 13 U/L (ref 6–29)
AST: 19 U/L (ref 10–35)
BUN: 15 mg/dL (ref 7–25)
CALCIUM: 9.9 mg/dL (ref 8.6–10.4)
CO2: 32 mmol/L — ABNORMAL HIGH (ref 20–31)
Chloride: 98 mmol/L (ref 98–110)
Creat: 0.81 mg/dL (ref 0.60–0.93)
GFR, EST NON AFRICAN AMERICAN: 71 mL/min (ref >=60)
GFR, Est African American: 82 mL/min (ref >=60)
Glucose, Bld: 99 mg/dL (ref 65–99)
POTASSIUM: 4.6 mmol/L (ref 3.5–5.3)
SODIUM: 137 mmol/L (ref 135–146)
Total Bilirubin: 0.3 mg/dL (ref 0.2–1.2)
Total Protein: 7 g/dL (ref 6.1–8.1)

## 2017-01-07 LAB — TSH: TSH: 3.26 mIU/L

## 2017-01-10 ENCOUNTER — Telehealth: Payer: Self-pay | Admitting: Physician Assistant

## 2017-01-10 NOTE — Telephone Encounter (Signed)
Patient called and stated her blood pressure was 189/128 taken by [employer]. Patient advised to go to ED to rule out cardiac complications.

## 2017-01-10 NOTE — Telephone Encounter (Signed)
Pt called and stated that she talked to a triage nurse this morning and they told her to go to the hospital. The pt called and wanted to let you know that the hospital released her. They several test but didn't find nothing wrong except her Blood Pressure being high. Thanks

## 2017-01-13 ENCOUNTER — Ambulatory Visit (INDEPENDENT_AMBULATORY_CARE_PROVIDER_SITE_OTHER): Payer: Medicare Other

## 2017-01-13 ENCOUNTER — Other Ambulatory Visit: Payer: Self-pay | Admitting: Osteopathic Medicine

## 2017-01-13 ENCOUNTER — Ambulatory Visit (INDEPENDENT_AMBULATORY_CARE_PROVIDER_SITE_OTHER): Payer: Medicare Other | Admitting: Physician Assistant

## 2017-01-13 VITALS — BP 155/81 | HR 61 | Wt 193.0 lb

## 2017-01-13 DIAGNOSIS — M545 Low back pain, unspecified: Secondary | ICD-10-CM

## 2017-01-13 DIAGNOSIS — I10 Essential (primary) hypertension: Secondary | ICD-10-CM

## 2017-01-13 DIAGNOSIS — M25562 Pain in left knee: Secondary | ICD-10-CM

## 2017-01-13 DIAGNOSIS — M810 Age-related osteoporosis without current pathological fracture: Secondary | ICD-10-CM | POA: Diagnosis not present

## 2017-01-13 DIAGNOSIS — B009 Herpesviral infection, unspecified: Secondary | ICD-10-CM

## 2017-01-13 NOTE — Patient Instructions (Addendum)
Tylenol 650mg  every 8 hours for knee/muscle pain Make an appointment to see Dr. Darene Lamer (sports Medicine) if no improvement in 2-3 weeks  DASH eating plan to lower blood pressure Continue daily medications

## 2017-01-13 NOTE — Progress Notes (Signed)
HPI:                                                                Dana Calderon is a 76 y.o. female who presents to [facility] [place] today for emergency department follow-up  Patient developed a sudden onset headache last week on 01/10/17 and drove herself to the fire department. She was found to have a very elevated blood pressure of >630 systolic and transported to the emergency department. ED work-up including CT head were negative. Headache improved with Tylenol and ice. Blood pressure improved to 188/79.  Patient presents today with home BP readings in 120's-150's. Denies vision change, headache, chest pain, dyspnea, lightheadedness, and edema. She has her own regimen of taking her blood pressure medications at various times and dosages throughout the day that works well for her.  Patient states that while EMS was transporting her from their stretcher to ED stretcher she injured her low back and left knee. She does have a history of chronic back pain and left knee arthroscopy.   Past Medical History:  Diagnosis Date  . Anxiety   . Back pain, chronic   . GERD (gastroesophageal reflux disease)   . Hemorrhoid   . Hypertension   . IBS (irritable bowel syndrome)   . Migraine   . Vertigo    Past Surgical History:  Procedure Laterality Date  . ABDOMINAL HYSTERECTOMY    . BREAST SURGERY    . HERNIA REPAIR    . KNEE ARTHROSCOPY Left   . LUMBAR DISC SURGERY    . MASTECTOMY    . TONSILLECTOMY    . TUBAL LIGATION     Social History  Substance Use Topics  . Smoking status: Never Smoker  . Smokeless tobacco: Never Used  . Alcohol use No   family history includes Hypertension in her mother; Stroke in her father.  ROS: negative except as noted in the HPI  Medications: Current Outpatient Prescriptions  Medication Sig Dispense Refill  . ALPRAZolam (XANAX) 0.25 MG tablet TAKE ONE TABLET BY MOUTH AT BEDTIME AS NEEDED FOR ANXIETY 30 tablet 0   . BYSTOLIC 20 MG TABS TAKE TWO TABLETS BY MOUTH EVERY DAY 60 tablet 0  . Ciclopirox (CNL8 NAIL) 8 % KIT Apply 1 application topically 2 (two) times daily. 1 each 3  . CRANBERRY PO Take 1 capsule by mouth daily.    . diclofenac sodium (VOLTAREN) 1 % GEL Apply 2 g topically 4 (four) times daily. To affected joint. 100 g 11  . diphenoxylate-atropine (LOMOTIL) 2.5-0.025 MG per tablet Take 1 tablet by mouth 4 (four) times daily as needed for diarrhea or loose stools.     . fluocinonide cream (LIDEX) 1.60 % Apply 1 application topically 2 (two) times daily.    . hydrochlorothiazide (HYDRODIURIL) 12.5 MG tablet Take one tablet a day 30 tablet 0  . hydrocortisone (ANUSOL-HC) 25 MG suppository Place 1 suppository (25 mg total) rectally 2 (two) times daily as needed for hemorrhoids or itching. 12 suppository 6  . lisinopril (PRINIVIL,ZESTRIL) 40 MG tablet TAKE ONE TABLET BY MOUTH EVERY DAY 30 tablet 0  . loratadine (CLARITIN) 10 MG tablet Take 1 tablet (10 mg total) by mouth daily. 10 tablet 0  . meclizine (  ANTIVERT) 25 MG tablet Take 1 tablet (25 mg total) by mouth 3 (three) times daily as needed for dizziness. 30 tablet 0  . miconazole (MICOTIN) 2 % powder Apply topically 2 (two) times daily as needed for itching. 70 g 3  . Multiple Minerals-Vitamins (CALCIUM-MAGNESIUM-ZINC-D3 PO) Take 1 tablet by mouth 2 (two) times daily.    . Multiple Vitamin (MULTIVITAMIN) tablet Take 1 tablet by mouth daily.    Marland Kitchen omeprazole (PRILOSEC) 20 MG capsule TAKE ONE CAPSULE BY MOUTH EVERY DAY 90 capsule 0  . OVER THE COUNTER MEDICATION Take 1 capsule by mouth daily. "Mega Red"    . Polyethylene Glycol 400 (BLINK TEARS OP) Place 1 drop into both eyes as needed (for dry eyes).    . sodium chloride (OCEAN) 0.65 % SOLN nasal spray Place 1 spray into both nostrils as needed for congestion. 15 mL 0  . valACYclovir (VALTREX) 500 MG tablet TAKE ONE TABLET BY MOUTH 2 TIMES A DAY. MAY REFILL IF NEW OUTBREAK OCCURS. 6 tablet 0   No  current facility-administered medications for this visit.    Allergies  Allergen Reactions  . Cefuroxime Axetil Shortness Of Breath    Has had cephalexin (Keflex) without side effects  . Edarbi [Azilsartan] Other (See Comments)    Heart pounding, foot swelling  . Methylprednisolone Other (See Comments) and Hypertension    Raised blood pressure and sugar  . Nystop [Nystatin] Other (See Comments)    Burning skin and breathing problems  . Penicillins Other (See Comments)    Face broke out in lumps  . Red Dye Itching, Rash and Other (See Comments)    Fainting  . Tamiflu [Oseltamivir Phosphate] Diarrhea and Other (See Comments)    Fainting  . Verelan [Verapamil Hcl Er] Shortness Of Breath  . Zithromax [Azithromycin] Shortness Of Breath  . Carbidopa-Levodopa Itching and Other (See Comments)    Itchy ears, throat and tongue   . Doxazosin Mesylate Other (See Comments)    Fast pulse  . Hctz [Hydrochlorothiazide] Other (See Comments)    Cannot tolerate  . Hyoscyamine Other (See Comments)    Shakes, dizzy and headache  . Influenza Vaccines Other (See Comments)    "head in a vice", nasal itching  . Other Other (See Comments)    Cardiolyte injection - burning through body  . Amlodipine Besylate Other (See Comments)    Unknown  . Asa [Aspirin] Rash  . Atenolol Other (See Comments)    Unknown  . Avelox [Moxifloxacin] Other (See Comments)    Unknown  . Benicar [Olmesartan] Other (See Comments)    Unknown  . Bentyl [Dicyclomine Hcl] Other (See Comments)    Headache and high BP  . Benzonatate Other (See Comments)    Unknown  . Bisoprolol Other (See Comments)    Unknown  . Cardizem [Diltiazem Hcl] Other (See Comments)    Unknown  . Carvedilol Other (See Comments)    Unknown  . Cefdinir Other (See Comments)    Unknown  . Chlorthalidone Other (See Comments)    Unknown  . Ciprofloxacin Other (See Comments)    Heartburn  . Citalopram Other (See Comments)    Unknown  . Clonidine  Derivatives Other (See Comments)    Unknown  . Diovan [Valsartan] Other (See Comments)    Unknown  . Doxycycline Other (See Comments)    Headache  . Erythromycin Other (See Comments)    Unknown  . Fioricet [Butalbital-Apap-Caffeine] Other (See Comments)    Headache and dizzy  .  Flexeril [Cyclobenzaprine] Other (See Comments)    Unknown  . Furosemide Other (See Comments)    Unknown  . Gabapentin Other (See Comments)    Unknown  . Hydralazine Other (See Comments)    Headache  . Hydroxyzine Other (See Comments)    nightmares  . Isosorbide Other (See Comments)    Unknown  . Lyrica [Pregabalin] Other (See Comments)    Unknown  . Macrobid [employer] Macro] Other (See Comments)    Unknown  . Mavik [Trandolapril] Other (See Comments)    Unknown  . Metronidazole Itching and Other (See Comments)    Dizzy  . Naproxen Other (See Comments)    Unknown  . Neo-Polycin Hc [Bacitra-Neomycin-Polymyxin-Hc] Itching  . Nexium [Esomeprazole Magnesium] Other (See Comments)    Headache  . Norflex [Orphenadrine Citrate] Other (See Comments)  . Oxybutynin Chloride Er Other (See Comments)    Unknown  . Paxil [Paroxetine Hcl] Other (See Comments)    Unknown  . Phenazopyridine Other (See Comments)    Unknown  . Prazosin Other (See Comments)    Unknown  . Prednisone Other (See Comments)    Unknown  . Promethazine Other (See Comments)    Unknown  . Prozac [Fluoxetine Hcl] Other (See Comments)    Unknown  . Spironolactone Other (See Comments)    Unknown  . Sulfur Other (See Comments)    Unknown  . Tizanidine Other (See Comments)    Unknown  . Valium [Diazepam] Other (See Comments)    "Too strong?"  . Zoloft [Sertraline Hcl] Other (See Comments)    Unknown       Objective:  BP (!) 155/81   Pulse 61   Wt 193 lb (87.5 kg)   BMI 30.68 kg/m  Gen: well-groomed, cooperative, not ill-appearing, no distress Pulm: Normal work of breathing, normal phonation, clear to  auscultation bilaterally, no wheezes, rales or rhonchi CV: Normal rate, regular rhythm, s1 and s2 distinct, no murmurs, clicks or rubs  GI: soft, nondistended, nontender Neuro: alert and oriented x 3, EOM's intact, DTR's intact Left knee: normal appearing without effusion, there is medial joint line tenderness, full active ROM, normal gait and station,  Back: no spinous process tenderness or step-off deformity, full active ROM Skin: warm and dry, no rashes or lesions on exposed skin, no cyanosis   No results found for this or any previous visit (from the past 72 hour(s)). No results found.    Assessment and Plan: 76 y.o. female with   1. Acute pain of left knee - DG Knee Complete 4 Views Left - Tylenol '650mg'$  every 6 hours for knee/muscle pain - follow-up with Dr. Dianah Field if no improvement in 2-3 weeks  2. Uncontrolled hypertension, goal 140/90 - this has been improving since the addition of HCTZ. If patient would take full 12.'5mg'$  tab, we would see better pressures. Unfortunately compliance is an issue secondary to poor tolerance to medication / anxiety with somatization. I think it's important Kellianne feels in control of managing her medicines. She is not open to a medication adjustment today. - She has an appt. With Cardiology on 01/29/17 for f/u of palpitations and abnormal ECG. ECHO is ordered and pending. - declined sleep study - cont daily meds - DASH eating plan  3. Acute on chronic bilateral low back pain without sciatica - DG Lumbar Spine Complete; Future - Tylenol '650mg'$  every 6 hours for knee/muscle pain - follow-up with Dr. Dianah Field if no improvement in 2-3 weeks  Patient education and anticipatory  guidance given Patient agrees with treatment plan Follow-up in 1 month or sooner as needed  Darlyne Russian PA-C

## 2017-01-15 ENCOUNTER — Inpatient Hospital Stay: Payer: Medicare Other | Admitting: Physician Assistant

## 2017-01-23 ENCOUNTER — Ambulatory Visit: Payer: Medicare Other | Admitting: Physician Assistant

## 2017-01-23 ENCOUNTER — Encounter: Payer: Self-pay | Admitting: *Deleted

## 2017-01-23 ENCOUNTER — Other Ambulatory Visit: Payer: Self-pay | Admitting: Physician Assistant

## 2017-01-23 ENCOUNTER — Emergency Department (INDEPENDENT_AMBULATORY_CARE_PROVIDER_SITE_OTHER)
Admission: EM | Admit: 2017-01-23 | Discharge: 2017-01-23 | Disposition: A | Discharge status: Home / Self Care | Payer: Medicare Other | Source: Home / Self Care | Attending: Family Medicine | Admitting: Family Medicine

## 2017-01-23 DIAGNOSIS — M7061 Trochanteric bursitis, right hip: Secondary | ICD-10-CM

## 2017-01-23 DIAGNOSIS — B009 Herpesviral infection, unspecified: Secondary | ICD-10-CM

## 2017-01-23 DIAGNOSIS — M545 Low back pain, unspecified: Secondary | ICD-10-CM

## 2017-01-23 LAB — POCT URINALYSIS DIP (MANUAL ENTRY)
Bilirubin, UA: NEGATIVE
GLUCOSE UA: NEGATIVE
Ketones, POC UA: NEGATIVE
NITRITE UA: NEGATIVE
Protein Ur, POC: NEGATIVE
Spec Grav, UA: 1.015 (ref 1.005–1.03)
UROBILINOGEN UA: 0.2 (ref 0–1)
pH, UA: 5.5 (ref 5–8)

## 2017-01-23 NOTE — Discharge Instructions (Signed)
Apply ice pack for 20 to 30 minutes, 3 to 4 times daily  Continue until pain decreases.  °Begin range of motion and stretching exercises as tolerated. °

## 2017-01-23 NOTE — ED Triage Notes (Signed)
Pt c/o RT side low back and flank pain x 1 day. Last dose tylenol at 1230 without relief.

## 2017-01-23 NOTE — ED Provider Notes (Signed)
[name]    CSN: [id number] Arrival date & time: 01/23/17  1346     History   Chief Complaint Chief Complaint  Patient presents with  . Back Pain    HPI Dana Calderon is a 76 y.o. female.   Patient complains of onset of vague pain in her right flank area yesterday, worse when walking.  Today the pain radiates anteriorly somewhat, but not to her abdomen.  No urinary symptoms.  No fevers, chills, and sweats.  No rash.  She feels well otherwise.   The history is provided by the patient.  Back Pain  Pain location: right flank and right lower back. Quality:  Aching Pain severity:  Mild Pain is:  Same all the time Onset quality:  Sudden Duration:  1 day Timing:  Constant Progression:  Worsening Chronicity:  New Context: not falling, not lifting heavy objects, not physical stress, not recent illness, not recent injury and not twisting   Relieved by:  Nothing Worsened by:  Ambulation Ineffective treatments:  Cold packs Associated symptoms: no abdominal pain, no abdominal swelling, no bladder incontinence, no bowel incontinence, no dysuria, no fever, no leg pain, no numbness, no paresthesias, no pelvic pain, no perianal numbness, no tingling, no weakness and no weight loss     Past Medical History:  Diagnosis Date  . Anxiety   . Back pain, chronic   . GERD (gastroesophageal reflux disease)   . Hemorrhoid   . Hypertension   . IBS (irritable bowel syndrome)   . Migraine   . Vertigo     Patient Active Problem List   Diagnosis Date Noted  . Intertrigo 01/06/2017  . Osteopenia 01/06/2017  . Palpitations 01/06/2017  . Abnormal ECG 01/06/2017  . Xerosis of skin 01/06/2017  . Onychomycosis 09/13/2016  . Confusion and disorientation 09/13/2016  . Right ankle pain 07/23/2016  . 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 Mouth lesion 06/30/2015  . Uncontrolled hypertension 05/30/2015  . Prediabetes 05/30/2015   . Seborrheic dermatitis 05/30/2015    Past Surgical History:  Procedure Laterality Date  . ABDOMINAL HYSTERECTOMY    . BREAST SURGERY    . HERNIA REPAIR    . KNEE ARTHROSCOPY Left   . LUMBAR DISC SURGERY    . MASTECTOMY    . TONSILLECTOMY    . TUBAL LIGATION      OB History    No data available       Home Medications    Prior to Admission medications   Medication Sig Start Date End Date Taking? Authorizing Provider  ALPRAZolam Duanne Moron) 0.25 MG tablet TAKE ONE TABLET BY MOUTH AT BEDTIME AS NEEDED FOR ANXIETY 12/03/16   Emeterio Reeve, DO  BYSTOLIC 20 MG TABS TAKE TWO TABLETS BY MOUTH EVERY DAY 01/13/17   Trixie Dredge, PA-C  Ciclopirox (CNL8 NAIL) 8 % KIT Apply 1 application topically 2 (two) times daily. 09/13/16   Natalie Alexander, DO  CRANBERRY PO Take 1 capsule by mouth daily.    Historical Provider, MD  diclofenac sodium (VOLTAREN) 1 % GEL Apply 2 g topically 4 (four) times daily. To affected joint. 07/23/16   Gregor Hams, MD  diphenoxylate-atropine (LOMOTIL) 2.5-0.025 MG per tablet Take 1 tablet by mouth 4 (four) times daily as needed for diarrhea or loose stools.     Historical Provider, MD  fluocinonide cream (LIDEX) 8.56 % Apply 1 application topically 2 (two) times daily.    Historical Provider,  MD  hydrochlorothiazide (HYDRODIURIL) 12.5 MG tablet Take one tablet a day 12/31/16   Fransico Meadow, PA-C  hydrocortisone (ANUSOL-HC) 25 MG suppository Place 1 suppository (25 mg total) rectally 2 (two) times daily as needed for hemorrhoids or itching. 07/08/16   Emeterio Reeve, DO  lisinopril (PRINIVIL,ZESTRIL) 40 MG tablet TAKE ONE TABLET BY MOUTH EVERY DAY 01/01/17   Emeterio Reeve, DO  loratadine (CLARITIN) 10 MG tablet Take 1 tablet (10 mg total) by mouth daily. 07/04/16   Duffy Bruce, MD  meclizine (ANTIVERT) 25 MG tablet Take 1 tablet (25 mg total) by mouth 3 (three) times daily as needed for dizziness. 07/08/16   Emeterio Reeve, DO  miconazole  (MICOTIN) 2 % powder Apply topically 2 (two) times daily as needed for itching. 01/06/17   Trixie Dredge, PA-C  Multiple Minerals-Vitamins (CALCIUM-MAGNESIUM-ZINC-D3 PO) Take 1 tablet by mouth 2 (two) times daily.    Historical Provider, MD  Multiple Vitamin (MULTIVITAMIN) tablet Take 1 tablet by mouth daily.    Historical Provider, MD  omeprazole (PRILOSEC) 20 MG capsule TAKE ONE CAPSULE BY MOUTH EVERY DAY 10/29/16   Emeterio Reeve, DO  OVER THE COUNTER MEDICATION Take 1 capsule by mouth daily. "Mega Red"    Historical Provider, MD  Polyethylene Glycol 400 (BLINK TEARS OP) Place 1 drop into both eyes as needed (for dry eyes).    Historical Provider, MD  sodium chloride (OCEAN) 0.65 % SOLN nasal spray Place 1 spray into both nostrils as needed for congestion. 07/04/16   Duffy Bruce, MD  valACYclovir (VALTREX) 500 MG tablet TAKE ONE TABLET BY MOUTH 2 TIMES A DAY. MAY REFILL IF NEW OUTBREAK OCCURS. 01/23/17   Trixie Dredge, PA-C    Family History Family History  Problem Relation Age of Onset  . Hypertension Mother   . Stroke Father     Social History Social History  Substance Use Topics  . Smoking status: Never Smoker  . Smokeless tobacco: Never Used  . Alcohol use No     Allergies   Cefuroxime axetil; Edarbi [azilsartan]; Methylprednisolone; Nystop [nystatin]; Penicillins; Red dye; Tamiflu [oseltamivir phosphate]; Verelan [verapamil hcl er]; Zithromax [azithromycin]; Carbidopa-levodopa; Doxazosin mesylate; Hyoscyamine; Influenza vaccines; Other; Amlodipine besylate; Asa [aspirin]; Atenolol; Avelox [moxifloxacin]; Benicar [olmesartan]; Bentyl [dicyclomine hcl]; Benzonatate; Bisoprolol; Cardizem [diltiazem hcl]; Carvedilol; Cefdinir; Chlorthalidone; Ciprofloxacin; Citalopram; Clonidine derivatives; Diovan [valsartan]; Doxycycline; Erythromycin; Fioricet [butalbital-apap-caffeine]; Flexeril [cyclobenzaprine]; Furosemide; Gabapentin; Hydralazine; Hydroxyzine;  Isosorbide; Lyrica [pregabalin]; Macrobid [nitrofurantoin monohyd macro]; Mavik [trandolapril]; Metronidazole; Naproxen; Neo-polycin hc [bacitra-neomycin-polymyxin-hc]; Nexium [esomeprazole magnesium]; Norflex [orphenadrine citrate]; Oxybutynin chloride er; Paxil [paroxetine hcl]; Phenazopyridine; Prazosin; Prednisone; Promethazine; Prozac [fluoxetine hcl]; Spironolactone; Sulfur; Tizanidine; Valium [diazepam]; and Zoloft [sertraline hcl]   Review of Systems Review of Systems  Constitutional: Negative for fever and weight loss.  Gastrointestinal: Negative for abdominal pain and bowel incontinence.  Genitourinary: Negative for bladder incontinence, dysuria and pelvic pain.  Musculoskeletal: Positive for back pain.  Neurological: Negative for tingling, weakness, numbness and paresthesias.  All other systems reviewed and are negative.    Physical Exam Triage Vital Signs ED Triage Vitals  Enc Vitals Group     BP 01/23/17 1401 162/89     Pulse Rate 01/23/17 1401 64     Resp 01/23/17 1401 18     Temp 01/23/17 1401 98.1 F (36.7 C)     Temp Source 01/23/17 1401 Oral     SpO2 01/23/17 1401 96 %     Weight 01/23/17 1402 192 lb (87.1 kg)     Height 01/23/17 1402  $'5\' 6"'i$  (1.676 m)     Head Circumference --      Peak Flow --      Pain Score 01/23/17 1403 8     Pain Loc --      Pain Edu? --      Excl. in [place]? --    No data found.   Updated Vital Signs BP 162/89 (BP Location: Left Arm)   Pulse 64   Temp 98.1 F (36.7 C) (Oral)   Resp 18   Ht '5\' 6"'$  (1.676 m)   Wt 192 lb (87.1 kg)   SpO2 96%   BMI 30.99 kg/m   Visual Acuity Right Eye Distance:   Left Eye Distance:   Bilateral Distance:    Right Eye Near:   Left Eye Near:    Bilateral Near:     Physical Exam  Constitutional: She appears well-developed and well-nourished. No distress.  HENT:  Head: Normocephalic.  Mouth/Throat: Oropharynx is clear and moist.  Eyes: Conjunctivae are normal. Pupils are equal, round, and  reactive to light.  Neck: Normal range of motion.  Cardiovascular: Normal heart sounds.   Pulmonary/Chest: Breath sounds normal.  Abdominal: There is no tenderness.  Musculoskeletal:       Legs: Right hip has full range of motion.  There is tenderness over the right greater trochanter.  Back:  Range of motion relatively well preserved.  Can heel/toe walk and squat without difficulty.    Tenderness in the midline and right paraspinous muscles from L3 to Sacral area, extending to right buttock and hip.  Straight leg raising test is negative.  Sitting knee extension test is negative.  Strength and sensation in the lower extremities is normal.  Patellar and achilles reflexes are normal.  FABER negative.  Neurological: She is alert.  Skin: Skin is warm and dry. No rash noted.  Nursing note and vitals reviewed.    UC Treatments / Results  Labs (all labs ordered are listed, but only abnormal results are displayed) Labs Reviewed  POCT URINALYSIS DIP (MANUAL ENTRY) - Abnormal; Notable for the following:       Result Value   Blood, UA trace-lysed (*)    Leukocytes, UA small (1+) (*)    All other components within normal limits    EKG  EKG Interpretation None       Radiology No results found.  Procedures Procedures (including critical care time)  Medications Ordered in UC Medications - No data to display   Initial Impression / Assessment and Plan / UC Course  I have reviewed the triage vital signs and the nursing notes.  Pertinent labs & imaging results that were available during my care of the patient were reviewed by me and considered in my medical decision making (see chart for details).    Will avoid additional medication; patient has multiple medication allergies. Apply ice pack for 20 to 30 minutes, 3 to 4 times daily  Continue until pain decreases.  Begin range of motion and stretching exercises as tolerated. Followup with Dr. Aundria Mems or Dr. Lynne Leader  ([facility]) if not improving about two weeks.     Final Clinical Impressions(s) / UC Diagnoses   Final diagnoses:  Acute right-sided low back pain without sciatica  Greater trochanteric bursitis of right hip    New Prescriptions New Prescriptions   No medications on file     Kandra Nicolas, MD 01/23/17 [phone]

## 2017-01-27 NOTE — Progress Notes (Signed)
HPI: 76 year old female for evaluation of Hypertension. Patient previously followed in [place]. Patient had an exercise treadmill in January 2015 that was negative but submaximal. She also had an echocardiogram results not currently available. Patient denies dyspnea, chest pain, palpitations or syncope. She does track her blood pressure at home and her systolic appears to run in the 140-160 range. She has multiple documented medication intolerances but states she can typically tolerate lower doses.   Current Outpatient Prescriptions  Medication Sig Dispense Refill  . ALPRAZolam (XANAX) 0.25 MG tablet TAKE ONE TABLET BY MOUTH AT BEDTIME AS NEEDED FOR ANXIETY 30 tablet 0  . BYSTOLIC 20 MG TABS TAKE TWO TABLETS BY MOUTH EVERY DAY 60 tablet 0  . Ciclopirox (CNL8 NAIL) 8 % KIT Apply 1 application topically 2 (two) times daily. 1 each 3  . diphenoxylate-atropine (LOMOTIL) 2.5-0.025 MG per tablet Take 1 tablet by mouth 4 (four) times daily as needed for diarrhea or loose stools.     . hydrochlorothiazide (HYDRODIURIL) 12.5 MG tablet Take one tablet a day 30 tablet 0  . lisinopril (PRINIVIL,ZESTRIL) 40 MG tablet TAKE ONE TABLET BY MOUTH EVERY DAY 30 tablet 0  . loratadine (CLARITIN) 10 MG tablet Take 1 tablet (10 mg total) by mouth daily. 10 tablet 0  . meclizine (ANTIVERT) 25 MG tablet Take 1 tablet (25 mg total) by mouth 3 (three) times daily as needed for dizziness. 30 tablet 0  . Multiple Minerals-Vitamins (CALCIUM-MAGNESIUM-ZINC-D3 PO) Take 1 tablet by mouth 2 (two) times daily.    . Multiple Vitamin (MULTIVITAMIN) tablet Take 1 tablet by mouth daily.    Marland Kitchen omeprazole (PRILOSEC) 20 MG capsule TAKE ONE CAPSULE BY MOUTH EVERY DAY 90 capsule 0  . OVER THE COUNTER MEDICATION Take 1 capsule by mouth daily. "Mega Red"    . Polyethylene Glycol 400 (BLINK TEARS OP) Place 1 drop into both eyes as needed (for dry eyes).    . sodium chloride (OCEAN) 0.65 % SOLN nasal spray Place 1 spray into both  nostrils as needed for congestion. 15 mL 0  . valACYclovir (VALTREX) 500 MG tablet TAKE ONE TABLET BY MOUTH 2 TIMES A DAY. MAY REFILL IF NEW OUTBREAK OCCURS. 6 tablet 2  . amLODipine (NORVASC) 5 MG tablet Take 1 tablet (5 mg total) by mouth daily. 90 tablet 3   No current facility-administered medications for this visit.     Allergies  Allergen Reactions  . Cefuroxime Axetil Shortness Of Breath    Has had cephalexin (Keflex) without side effects  . Edarbi [Azilsartan] Other (See Comments)    Heart pounding, foot swelling  . Methylprednisolone Other (See Comments) and Hypertension    Raised blood pressure and sugar  . Nystop [Nystatin] Other (See Comments)    Burning skin and breathing problems  . Penicillins Other (See Comments)    Face broke out in lumps  . Red Dye Itching, Rash and Other (See Comments)    Fainting  . Tamiflu [Oseltamivir Phosphate] Diarrhea and Other (See Comments)    Fainting  . Verelan [Verapamil Hcl Er] Shortness Of Breath  . Zithromax [Azithromycin] Shortness Of Breath  . Carbidopa-Levodopa Itching and Other (See Comments)    Itchy ears, throat and tongue   . Doxazosin Mesylate Other (See Comments)    Fast pulse  . Hyoscyamine Other (See Comments)    Shakes, dizzy and headache  . Influenza Vaccines Other (See Comments)    "head in a vice", nasal itching  . Other Other (See  Comments)    Cardiolyte injection - burning through body  . Amlodipine Besylate Other (See Comments)    Unknown  . Asa [Aspirin] Rash  . Atenolol Other (See Comments)    Unknown  . Avelox [Moxifloxacin] Other (See Comments)    Unknown  . Benicar [Olmesartan] Other (See Comments)    Unknown  . Bentyl [Dicyclomine Hcl] Other (See Comments)    Headache and high BP  . Benzonatate Other (See Comments)    Unknown  . Bisoprolol Other (See Comments)    Unknown  . Cardizem [Diltiazem Hcl] Other (See Comments)    Unknown  . Carvedilol Other (See Comments)    Unknown  . Cefdinir  Other (See Comments)    Unknown  . Chlorthalidone Other (See Comments)    Unknown  . Ciprofloxacin Other (See Comments)    Heartburn  . Citalopram Other (See Comments)    Unknown  . Clonidine Derivatives Other (See Comments)    Unknown  . Diovan [Valsartan] Other (See Comments)    Unknown  . Doxycycline Other (See Comments)    Headache  . Erythromycin Other (See Comments)    Unknown  . Fioricet [Butalbital-Apap-Caffeine] Other (See Comments)    Headache and dizzy  . Flexeril [Cyclobenzaprine] Other (See Comments)    Unknown  . Furosemide Other (See Comments)    Unknown  . Gabapentin Other (See Comments)    Unknown  . Hydralazine Other (See Comments)    Headache  . Hydroxyzine Other (See Comments)    nightmares  . Isosorbide Other (See Comments)    Unknown  . Lyrica [Pregabalin] Other (See Comments)    Unknown  . Macrobid [employer] Macro] Other (See Comments)    Unknown  . Mavik [Trandolapril] Other (See Comments)    Unknown  . Metronidazole Itching and Other (See Comments)    Dizzy  . Naproxen Other (See Comments)    Unknown  . Neo-Polycin Hc [Bacitra-Neomycin-Polymyxin-Hc] Itching  . Nexium [Esomeprazole Magnesium] Other (See Comments)    Headache  . Norflex [Orphenadrine Citrate] Other (See Comments)  . Oxybutynin Chloride Er Other (See Comments)    Unknown  . Paxil [Paroxetine Hcl] Other (See Comments)    Unknown  . Phenazopyridine Other (See Comments)    Unknown  . Prazosin Other (See Comments)    Unknown  . Prednisone Other (See Comments)    Unknown  . Promethazine Other (See Comments)    Unknown  . Prozac [Fluoxetine Hcl] Other (See Comments)    Unknown  . Spironolactone Other (See Comments)    Unknown  . Sulfur Other (See Comments)    Unknown  . Tizanidine Other (See Comments)    Unknown  . Valium [Diazepam] Other (See Comments)    "Too strong?"  . Zoloft [Sertraline Hcl] Other (See Comments)    Unknown     Past Medical  History:  Diagnosis Date  . Anxiety   . Back pain, chronic   . GERD (gastroesophageal reflux disease)   . Hemorrhoid   . Hypertension   . IBS (irritable bowel syndrome)   . Migraine   . Vertigo     Past Surgical History:  Procedure Laterality Date  . ABDOMINAL HYSTERECTOMY    . BREAST SURGERY    . HERNIA REPAIR    . KNEE ARTHROSCOPY Left   . LUMBAR DISC SURGERY    . MASTECTOMY    . TONSILLECTOMY    . TUBAL LIGATION      Social History  Social History  . Marital status: Divorced    Spouse name: N/A  . Number of children: 1  . Years of education: N/A   Occupational History  . Not on file.   Social History Main Topics  . Smoking status: Never Smoker  . Smokeless tobacco: Never Used  . Alcohol use No  . Drug use: No  . Sexual activity: Not Currently   Other Topics Concern  . Not on file   Social History Narrative  . No narrative on file    Family History  Problem Relation Age of Onset  . Hypertension Mother   . Stroke Father     ROS: back pain but no fevers or chills, productive cough, hemoptysis, dysphasia, odynophagia, melena, hematochezia, dysuria, hematuria, rash, seizure activity, orthopnea, PND, pedal edema, claudication. Remaining systems are negative.  Physical Exam:   Blood pressure (!) 168/71, pulse 61, height '5\' 6"'$  (1.676 m), weight 193 lb 1.9 oz (87.6 kg).  General:  Well developed/well nourished in NAD Skin warm/dry Patient not depressed No peripheral clubbing Back-normal HEENT-normal/normal eyelids Neck supple/normal carotid upstroke bilaterally; no bruits; no JVD; no thyromegaly chest - CTA/ normal expansion CV - RRR/normal S1 and S2; no rubs or gallops;  PMI nondisplaced, 2/6 systolic murmur Abdomen -NT/ND, no HSM, no mass, + bowel sounds, no bruit 2+ femoral pulses, no bruits Ext-no edema, chords, 2+ DP Neuro-grossly nonfocal  ECG Sinus rhythm at a rate of 61. 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 Left  ventricular hypertrophy. Personally reviewed.  A/P  1 Hypertension-patient's blood pressure is elevated. She has documented multiple medication intolerances but states this typically is with higher doses and she does not know what the reactions were. I will try low-dose Norvasc. Begin 5 mg daily and continue remaining medications. She will follow her blood pressure at home and we will adjust regimen as needed. She will bring her cuff for next office visit so that we can correlate with ours.    2 abnormal electrocardiogram-we will arrange an echocardiogram to assess LV function. She does not have chest pain.  3 IBS-per primary care  Kirk Ruths, MD

## 2017-01-29 ENCOUNTER — Encounter: Payer: Self-pay | Admitting: Cardiology

## 2017-01-29 ENCOUNTER — Ambulatory Visit (INDEPENDENT_AMBULATORY_CARE_PROVIDER_SITE_OTHER): Payer: Medicare Other | Admitting: Cardiology

## 2017-01-29 ENCOUNTER — Other Ambulatory Visit: Payer: Self-pay | Admitting: Osteopathic Medicine

## 2017-01-29 VITALS — BP 168/71 | HR 61 | Ht 66.0 in | Wt 193.1 lb

## 2017-01-29 DIAGNOSIS — I10 Essential (primary) hypertension: Secondary | ICD-10-CM

## 2017-01-29 DIAGNOSIS — R9431 Abnormal electrocardiogram [ECG] [EKG]: Secondary | ICD-10-CM | POA: Diagnosis not present

## 2017-01-29 MED ORDER — AMLODIPINE BESYLATE 5 MG PO TABS: 5.0000 mg | ORAL_TABLET | Freq: Every day | ORAL | 3 refills | Status: DC

## 2017-01-29 NOTE — Patient Instructions (Signed)
Medication Instructions:   START AMLODIPINE 5 MG ONCE DAILY  Testing/Procedures:  Your physician has requested that you have an echocardiogram. Echocardiography is a painless test that uses sound waves to create images of your heart. It provides your doctor with information about the size and shape of your heart and how well your heart's chambers and valves are working. This procedure takes approximately one hour. There are no restrictions for this procedure.    Follow-Up:  Your physician recommends that you schedule a follow-up appointment in: [place]

## 2017-01-30 ENCOUNTER — Ambulatory Visit (HOSPITAL_BASED_OUTPATIENT_CLINIC_OR_DEPARTMENT_OTHER)
Admission: RE | Admit: 2017-01-30 | Discharge: 2017-01-30 | Disposition: A | Discharge status: Home / Self Care | Payer: Medicare Other | Source: Ambulatory Visit | Attending: Cardiology | Admitting: Cardiology

## 2017-01-30 DIAGNOSIS — R9431 Abnormal electrocardiogram [ECG] [EKG]: Secondary | ICD-10-CM

## 2017-01-30 DIAGNOSIS — I10 Essential (primary) hypertension: Secondary | ICD-10-CM | POA: Diagnosis not present

## 2017-01-30 NOTE — Progress Notes (Signed)
  Echocardiogram 2D Echocardiogram has been performed.  Dana Calderon 01/30/2017, 3:34 PM

## 2017-02-03 ENCOUNTER — Ambulatory Visit: Payer: Medicare Other | Admitting: Physician Assistant

## 2017-02-04 ENCOUNTER — Ambulatory Visit (INDEPENDENT_AMBULATORY_CARE_PROVIDER_SITE_OTHER): Payer: Medicare Other | Admitting: Physician Assistant

## 2017-02-04 ENCOUNTER — Encounter (INDEPENDENT_AMBULATORY_CARE_PROVIDER_SITE_OTHER): Payer: Self-pay

## 2017-02-04 VITALS — BP 154/84 | HR 70 | Wt 192.0 lb

## 2017-02-04 DIAGNOSIS — B001 Herpesviral vesicular dermatitis: Secondary | ICD-10-CM | POA: Diagnosis not present

## 2017-02-04 DIAGNOSIS — N760 Acute vaginitis: Secondary | ICD-10-CM

## 2017-02-04 LAB — WET PREP FOR TRICH, YEAST, CLUE
CLUE CELLS WET PREP: NONE SEEN
TRICH WET PREP: NONE SEEN
Yeast Wet Prep HPF POC: NONE SEEN

## 2017-02-04 MED ORDER — METRONIDAZOLE 250 MG PO TABS: 250.0000 mg | ORAL_TABLET | Freq: Three times a day (TID) | ORAL | 0 refills | Status: DC

## 2017-02-04 MED ORDER — METRONIDAZOLE 250 MG PO TABS: 250.0000 mg | ORAL_TABLET | Freq: Three times a day (TID) | ORAL | 0 refills | Status: AC

## 2017-02-04 NOTE — Progress Notes (Signed)
HPI:                                                                Dana Calderon is a 76 y.o. female who presents to [facility] [place] today for vaginal irritation  Patient reports vaginal irritation and dysuria x 3 days. Denies fever, chills, nausea, vomiting, abdominal or flank pain. She is s/p hysterectomy for uterine fibroids. She has been self-treating with Monistat and OTC feminine wipes. 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 She is not currently sexually active, but has a history of unprotected sexual intercourse with multiple female partners last year. Patient states she has a history of trichomonas infection.    Past Medical History:  Diagnosis Date  . Anxiety   . Back pain, chronic   . GERD (gastroesophageal reflux disease)   . Hemorrhoid   . Hypertension   . IBS (irritable bowel syndrome)   . Migraine   . Vertigo    Past Surgical History:  Procedure Laterality Date  . ABDOMINAL HYSTERECTOMY    . BREAST SURGERY    . HERNIA REPAIR    . KNEE ARTHROSCOPY Left   . LUMBAR DISC SURGERY    . MASTECTOMY    . TONSILLECTOMY    . TUBAL LIGATION     Social History  Substance Use Topics  . Smoking status: Never Smoker  . Smokeless tobacco: Never Used  . Alcohol use No   family history includes Hypertension in her mother; Stroke in her father.  ROS: negative except as noted in the HPI  Medications: Current Outpatient Prescriptions  Medication Sig Dispense Refill  . ALPRAZolam (XANAX) 0.25 MG tablet TAKE ONE TABLET BY MOUTH AT BEDTIME AS NEEDED FOR ANXIETY 30 tablet 0  . amLODipine (NORVASC) 5 MG tablet Take 1 tablet (5 mg total) by mouth daily. 90 tablet 3  . BYSTOLIC 20 MG TABS TAKE TWO TABLETS BY MOUTH EVERY DAY 60 tablet 0  . Ciclopirox (CNL8 NAIL) 8 % KIT Apply 1 application topically 2 (two) times daily. 1 each 3  . diphenoxylate-atropine (LOMOTIL) 2.5-0.025 MG per tablet Take 1 tablet  by mouth 4 (four) times daily as needed for diarrhea or loose stools.     . hydrochlorothiazide (HYDRODIURIL) 12.5 MG tablet Take one tablet a day 30 tablet 0  . lisinopril (PRINIVIL,ZESTRIL) 40 MG tablet TAKE ONE TABLET BY MOUTH EVERY DAY 30 tablet 0  . loratadine (CLARITIN) 10 MG tablet Take 1 tablet (10 mg total) by mouth daily. 10 tablet 0  . meclizine (ANTIVERT) 25 MG tablet Take 1 tablet (25 mg total) by mouth 3 (three) times daily as needed for dizziness. 30 tablet 0  . Multiple Minerals-Vitamins (CALCIUM-MAGNESIUM-ZINC-D3 PO) Take 1 tablet by mouth 2 (two) times daily.    . Multiple Vitamin (MULTIVITAMIN) tablet Take 1 tablet by mouth daily.    Marland Kitchen omeprazole (PRILOSEC) 20 MG capsule TAKE ONE CAPSULE BY MOUTH EVERY DAY 90 capsule 0  . OVER THE COUNTER MEDICATION Take 1 capsule by mouth daily. "Mega Red"    . Polyethylene Glycol 400 (BLINK TEARS OP) Place 1 drop into both eyes as needed (for dry eyes).    . sodium chloride (OCEAN) 0.65 % SOLN nasal spray  Place 1 spray into both nostrils as needed for congestion. 15 mL 0  . valACYclovir (VALTREX) 500 MG tablet TAKE ONE TABLET BY MOUTH 2 TIMES A DAY. MAY REFILL IF NEW OUTBREAK OCCURS. 6 tablet 2   No current facility-administered medications for this visit.    Allergies  Allergen Reactions  . Cefuroxime Axetil Shortness Of Breath    Has had cephalexin (Keflex) without side effects  . Edarbi [Azilsartan] Other (See Comments)    Heart pounding, foot swelling  . Methylprednisolone Other (See Comments) and Hypertension    Raised blood pressure and sugar  . Nystop [Nystatin] Other (See Comments)    Burning skin and breathing problems  . Penicillins Other (See Comments)    Face broke out in lumps  . Red Dye Itching, Rash and Other (See Comments)    Fainting  . Tamiflu [Oseltamivir Phosphate] Diarrhea and Other (See Comments)    Fainting  . Verelan [Verapamil Hcl Er] Shortness Of Breath  . Zithromax [Azithromycin] Shortness Of Breath  .  Carbidopa-Levodopa Itching and Other (See Comments)    Itchy ears, throat and tongue   . Doxazosin Mesylate Other (See Comments)    Fast pulse  . Hyoscyamine Other (See Comments)    Shakes, dizzy and headache  . Influenza Vaccines Other (See Comments)    "head in a vice", nasal itching  . Other Other (See Comments)    Cardiolyte injection - burning through body  . Amlodipine Besylate Other (See Comments)    Unknown  . Asa [Aspirin] Rash  . Atenolol Other (See Comments)    Unknown  . Avelox [Moxifloxacin] Other (See Comments)    Unknown  . Benicar [Olmesartan] Other (See Comments)    Unknown  . Bentyl [Dicyclomine Hcl] Other (See Comments)    Headache and high BP  . Benzonatate Other (See Comments)    Unknown  . Bisoprolol Other (See Comments)    Unknown  . Cardizem [Diltiazem Hcl] Other (See Comments)    Unknown  . Carvedilol Other (See Comments)    Unknown  . Cefdinir Other (See Comments)    Unknown  . Chlorthalidone Other (See Comments)    Unknown  . Ciprofloxacin Other (See Comments)    Heartburn  . Citalopram Other (See Comments)    Unknown  . Clonidine Derivatives Other (See Comments)    Unknown  . Diovan [Valsartan] Other (See Comments)    Unknown  . Doxycycline Other (See Comments)    Headache  . Erythromycin Other (See Comments)    Unknown  . Fioricet [Butalbital-Apap-Caffeine] Other (See Comments)    Headache and dizzy  . Flexeril [Cyclobenzaprine] Other (See Comments)    Unknown  . Furosemide Other (See Comments)    Unknown  . Gabapentin Other (See Comments)    Unknown  . Hydralazine Other (See Comments)    Headache  . Hydroxyzine Other (See Comments)    nightmares  . Isosorbide Other (See Comments)    Unknown  . Lyrica [Pregabalin] Other (See Comments)    Unknown  . Macrobid [employer] Macro] Other (See Comments)    Unknown  . Mavik [Trandolapril] Other (See Comments)    Unknown  . Metronidazole Itching and Other (See Comments)     Dizzy  . Naproxen Other (See Comments)    Unknown  . Neo-Polycin Hc [Bacitra-Neomycin-Polymyxin-Hc] Itching  . Nexium [Esomeprazole Magnesium] Other (See Comments)    Headache  . Norflex [Orphenadrine Citrate] Other (See Comments)  . Oxybutynin Chloride Er Other (See  Comments)    Unknown  . Paxil [Paroxetine Hcl] Other (See Comments)    Unknown  . Phenazopyridine Other (See Comments)    Unknown  . Prazosin Other (See Comments)    Unknown  . Prednisone Other (See Comments)    Unknown  . Promethazine Other (See Comments)    Unknown  . Prozac [Fluoxetine Hcl] Other (See Comments)    Unknown  . Spironolactone Other (See Comments)    Unknown  . Sulfur Other (See Comments)    Unknown  . Tizanidine Other (See Comments)    Unknown  . Valium [Diazepam] Other (See Comments)    "Too strong?"  . Zoloft [Sertraline Hcl] Other (See Comments)    Unknown       Objective:  BP (!) 154/84   Pulse 70   Wt 192 lb (87.1 kg)   BMI 30.99 kg/m  Gen: well-groomed, cooperative, not ill-appearing, no distress GU: vulva without rashes or lesions, one erythematous tender lesion on right labia minora, vaginal mucosa dry and erythematous, moderate amount of yellow discharge present Lymph: no inguinal lymphadenopathy    No results found for this or any previous visit (from the past 72 hour(s)). No results found.    Assessment and Plan: 76 y.o. female with   1. Acute vaginitis - will treat empirically for trichomoniasis pending GC/Chlamydia/Wet prep - patient declined HIV and RPR testing. She has not been sexually active for over a year and believes she had a negative HIV test at that time - metroNIDAZOLE (FLAGYL) 250 MG tablet; Take 1 tablet (250 mg total) by mouth 3 (three) times daily.  Dispense: 21 tablet; Refill: 0 - GC/Chlamydia Probe Amp - WET PREP FOR TRICH, YEAST, CLUE  2. Herpes labialis without complication - start Valtrex '500mg'$  bid x 3 days   Patient education and  anticipatory guidance given Patient agrees with treatment plan Follow-up if no improvement in 1 week   Darlyne Russian PA-C

## 2017-02-04 NOTE — Patient Instructions (Addendum)
Take Metronidazole 1 tab three times daily for 7 days Start Valtrex 1 tab twice a day for 3 days Wash with warm water in the shower No soaking in the bath Vaginal rest No soaps/wipes/OTC products per vagina Return if no improvement in 1 week

## 2017-02-05 ENCOUNTER — Telehealth: Payer: Self-pay

## 2017-02-05 LAB — GC/CHLAMYDIA PROBE AMP
CT PROBE, AMP APTIMA: NOT DETECTED
GC PROBE AMP APTIMA: NOT DETECTED

## 2017-02-05 NOTE — Telephone Encounter (Signed)
Reassure Dana Calderon that she had diarrhea prior to starting the Flagyl, so this is not the cause. I strongly advise she take the medication as there is no alternative to treating the vaginal infection. She can hold off on taking the Amlodipine until she is done with the Flagyl

## 2017-02-05 NOTE — Telephone Encounter (Signed)
Pt believes that the flagyl is causing her to have burning diarrhea.  She also that right after taking it her tongue felt weird. She thinks that maybe the amlodipine had something to do with this as well. She only taken one dose so far. Please advise.

## 2017-02-06 NOTE — Telephone Encounter (Signed)
Pt notified of recommendations listed below.  Pt stated that she will not take the flagyl.

## 2017-02-10 ENCOUNTER — Other Ambulatory Visit: Payer: Self-pay | Admitting: Physician Assistant

## 2017-02-10 ENCOUNTER — Telehealth: Payer: Self-pay

## 2017-02-10 NOTE — Telephone Encounter (Signed)
Pt reports that she decided to take the flagyl but only a quarter of a pill at a time equaling 4 time daily. EH/RMA

## 2017-02-11 ENCOUNTER — Telehealth: Payer: Self-pay

## 2017-02-11 NOTE — Telephone Encounter (Signed)
Pt called again today. She reports that after taking a quarter dose of flagyl she began to have blurred vision. She is once again refusing to take the med. -EH/RMA

## 2017-02-12 ENCOUNTER — Ambulatory Visit: Payer: Medicare Other | Admitting: Physician Assistant

## 2017-02-12 NOTE — Telephone Encounter (Signed)
Let Dana Calderon know I'm sorry she is having blurred vision. She does not have to take the Flagyl if she does not want to. I recommend she come in to see me in 2 weeks so we can repeat her pelvic exam and make sure there is no infection.

## 2017-02-13 NOTE — Telephone Encounter (Signed)
Pt notified and have an appointment on Monday -EH/RMA

## 2017-02-14 ENCOUNTER — Telehealth: Payer: Self-pay | Admitting: Cardiology

## 2017-02-14 NOTE — Telephone Encounter (Signed)
Noted extensive allergies list include :  chlorthalidone Hydralazine Spironolactone Furosemide benicar Carvedilol Bisoprolol  Recommendation:  May increase HCTZ to 25mg  daily and repeat BMET in 1 week.  Okay to schedule for HTN pharmacist clinic if patient agreeable.

## 2017-02-14 NOTE — Telephone Encounter (Signed)
Returned call to patient She started amlodipine after seeing Dr. Stanford Breed on 2/28 - started it a few days ago She started having a "body itch" that is driving her crazy at night - stomach, back, rump She took a 1/2 tablet today and notice itching right after taking the medication  She states her BP is 130s/80s - she states this is normal for her She has another problem that has come up that has worked her nerves  Patient does not plan to take more amlodipine 5mg  - removed from med list  Will route to Dr. Stanford Breed and pharmacy staff for advice/FYI She is not wanting to start anything else at this time

## 2017-02-14 NOTE — Telephone Encounter (Signed)
New Message     Pt c/o medication issue:  1. Name of Medication:  amLODipine (NORVASC) 5 MG tablet Take 1 tablet (5 mg total) by mouth daily     2. How are you currently taking this medication (dosage and times per day)? 1/2 tablet  3. Are you having a reaction (difficulty breathing--STAT)? no  4. What is your medication issue? Allergic reaction , having itching all over having to put medication on for the itching    She stopped taking it

## 2017-02-15 NOTE — Telephone Encounter (Signed)
Agree with plan; dc norvasc; increase HCTz to 25 mg daily; bmet one week; follow BP Kirk Ruths

## 2017-02-17 ENCOUNTER — Encounter: Payer: Self-pay | Admitting: Physician Assistant

## 2017-02-17 ENCOUNTER — Ambulatory Visit (INDEPENDENT_AMBULATORY_CARE_PROVIDER_SITE_OTHER): Payer: Medicare Other | Admitting: Physician Assistant

## 2017-02-17 VITALS — BP 144/84 | HR 67 | Wt 193.0 lb

## 2017-02-17 DIAGNOSIS — N761 Subacute and chronic vaginitis: Secondary | ICD-10-CM | POA: Diagnosis not present

## 2017-02-17 MED ORDER — FLUCONAZOLE 150 MG PO TABS: 150.0000 mg | ORAL_TABLET | Freq: Once | ORAL | 0 refills | Status: AC

## 2017-02-17 NOTE — Progress Notes (Signed)
HPI:                                                                Dana Calderon is a 76 y.o. female who presents to [facility] today for vaginitis follow-up  Patient with PMHx of HTN, prediabetes, breast cancer, anxiety and HSV genitalis complains of vaginal discomfort and pruritis for approximately 2 weeks. Wet Prep was positive for few white blood cells. GC/Chlamydia negative. She was instructed to take her Valtrex for a herpes labialis lesion and started on Metronidazole for empiric treatment of trichomonas. She was unable to complete antibiotic therapy due to intolerance. She denies fever, chills, weight loss, abdominal/pelvic pain, or vaginal bleeding. She is not currently sexually active. She is s/p abdominal hysterectomy.  Past Medical History:  Diagnosis Date  . Anxiety   . Back pain, chronic   . Breast cancer ([place])    left  . GERD (gastroesophageal reflux disease)   . Hemorrhoid   . Hypertension   . IBS (irritable bowel syndrome)   . Migraine   . Vertigo    Past Surgical History:  Procedure Laterality Date  . ABDOMINAL HYSTERECTOMY    . BREAST SURGERY    . HERNIA REPAIR    . KNEE ARTHROSCOPY Left   . LUMBAR DISC SURGERY    . MASTECTOMY Left   . TONSILLECTOMY    . TUBAL LIGATION     Social History  Substance Use Topics  . Smoking status: Never Smoker  . Smokeless tobacco: Never Used  . Alcohol use No   family history includes Hypertension in her mother; Stroke in her father.  ROS: negative except as noted in the HPI  Medications: Current Outpatient Prescriptions  Medication Sig Dispense Refill  . ALPRAZolam (XANAX) 0.25 MG tablet TAKE ONE TABLET BY MOUTH AT BEDTIME AS NEEDED FOR ANXIETY 30 tablet 0  . amLODipine (NORVASC) 5 MG tablet Take 5 mg by mouth daily.    Marland Kitchen BYSTOLIC 20 MG TABS TAKE TWO TABLETS BY MOUTH EVERY DAY 60 tablet 3  . Ciclopirox (CNL8 NAIL) 8 % KIT Apply 1 application topically 2 (two) times  daily. 1 each 3  . diphenoxylate-atropine (LOMOTIL) 2.5-0.025 MG per tablet Take 1 tablet by mouth 4 (four) times daily as needed for diarrhea or loose stools.     Marland Kitchen lisinopril (PRINIVIL,ZESTRIL) 40 MG tablet TAKE ONE TABLET BY MOUTH EVERY DAY 30 tablet 0  . loratadine (CLARITIN) 10 MG tablet Take 1 tablet (10 mg total) by mouth daily. 10 tablet 0  . meclizine (ANTIVERT) 25 MG tablet Take 1 tablet (25 mg total) by mouth 3 (three) times daily as needed for dizziness. 30 tablet 0  . Multiple Minerals-Vitamins (CALCIUM-MAGNESIUM-ZINC-D3 PO) Take 1 tablet by mouth 2 (two) times daily.    . Multiple Vitamin (MULTIVITAMIN) tablet Take 1 tablet by mouth daily.    Marland Kitchen omeprazole (PRILOSEC) 20 MG capsule TAKE ONE CAPSULE BY MOUTH EVERY DAY 90 capsule 0  . OVER THE COUNTER MEDICATION Take 1 capsule by mouth daily. "Mega Red"    . Polyethylene Glycol 400 (BLINK TEARS OP) Place 1 drop into both eyes as needed (for dry eyes).    . sodium chloride (OCEAN) 0.65 % SOLN nasal spray Place  1 spray into both nostrils as needed for congestion. 15 mL 0  . valACYclovir (VALTREX) 500 MG tablet TAKE ONE TABLET BY MOUTH 2 TIMES A DAY. MAY REFILL IF NEW OUTBREAK OCCURS. 6 tablet 2  . fluconazole (DIFLUCAN) 150 MG tablet Take 1 tablet (150 mg total) by mouth once. 1 tablet 0   No current facility-administered medications for this visit.    Allergies  Allergen Reactions  . Cefuroxime Axetil Shortness Of Breath    Has had cephalexin (Keflex) without side effects  . Edarbi [Azilsartan] Other (See Comments)    Heart pounding, foot swelling  . Methylprednisolone Other (See Comments) and Hypertension    Raised blood pressure and sugar  . Nystop [Nystatin] Other (See Comments)    Burning skin and breathing problems  . Penicillins Other (See Comments)    Face broke out in lumps  . Red Dye Itching, Rash and Other (See Comments)    Fainting  . Tamiflu [Oseltamivir Phosphate] Diarrhea and Other (See Comments)    Fainting  .  Verelan [Verapamil Hcl Er] Shortness Of Breath  . Zithromax [Azithromycin] Shortness Of Breath  . Carbidopa-Levodopa Itching and Other (See Comments)    Itchy ears, throat and tongue   . Doxazosin Mesylate Other (See Comments)    Fast pulse  . Hyoscyamine Other (See Comments)    Shakes, dizzy and headache  . Influenza Vaccines Other (See Comments)    "head in a vice", nasal itching  . Other Other (See Comments)    Cardiolyte injection - burning through body  . Amlodipine Besylate Other (See Comments)    Unknown  . Asa [Aspirin] Rash  . Atenolol Other (See Comments)    Unknown  . Avelox [Moxifloxacin] Other (See Comments)    Unknown  . Benicar [Olmesartan] Other (See Comments)    Unknown  . Bentyl [Dicyclomine Hcl] Other (See Comments)    Headache and high BP  . Benzonatate Other (See Comments)    Unknown  . Bisoprolol Other (See Comments)    Unknown  . Cardizem [Diltiazem Hcl] Other (See Comments)    Unknown  . Carvedilol Other (See Comments)    Unknown  . Cefdinir Other (See Comments)    Unknown  . Chlorthalidone Other (See Comments)    Unknown  . Ciprofloxacin Other (See Comments)    Heartburn  . Citalopram Other (See Comments)    Unknown  . Clonidine Derivatives Other (See Comments)    Unknown  . Diovan [Valsartan] Other (See Comments)    Unknown  . Doxycycline Other (See Comments)    Headache  . Erythromycin Other (See Comments)    Unknown  . Fioricet [Butalbital-Apap-Caffeine] Other (See Comments)    Headache and dizzy  . Flexeril [Cyclobenzaprine] Other (See Comments)    Unknown  . Furosemide Other (See Comments)    Unknown  . Gabapentin Other (See Comments)    Unknown  . Hydralazine Other (See Comments)    Headache  . Hydroxyzine Other (See Comments)    nightmares  . Isosorbide Other (See Comments)    Unknown  . Lyrica [Pregabalin] Other (See Comments)    Unknown  . Macrobid [employer] Macro] Other (See Comments)    Unknown  . Mavik  [Trandolapril] Other (See Comments)    Unknown  . Metronidazole Itching and Other (See Comments)    Dizzy  . Naproxen Other (See Comments)    Unknown  . Neo-Polycin Hc [Bacitra-Neomycin-Polymyxin-Hc] Itching  . Nexium [Esomeprazole Magnesium] Other (See Comments)  Headache  . Norflex [Orphenadrine Citrate] Other (See Comments)  . Oxybutynin Chloride Er Other (See Comments)    Unknown  . Paxil [Paroxetine Hcl] Other (See Comments)    Unknown  . Phenazopyridine Other (See Comments)    Unknown  . Prazosin Other (See Comments)    Unknown  . Prednisone Other (See Comments)    Unknown  . Promethazine Other (See Comments)    Unknown  . Prozac [Fluoxetine Hcl] Other (See Comments)    Unknown  . Spironolactone Other (See Comments)    Unknown  . Sulfur Other (See Comments)    Unknown  . Tizanidine Other (See Comments)    Unknown  . Valium [Diazepam] Other (See Comments)    "Too strong?"  . Zoloft [Sertraline Hcl] Other (See Comments)    Unknown       Objective:  BP (!) 144/84   Pulse 67   Wt 193 lb (87.5 kg)   BMI 31.15 kg/m  Gen: well-groomed, cooperative, not ill-appearing, no distress GI: soft, obese, nondistended, nontender GU: vulva with erythema and edema, normal introitus and urethral meatus, vaginal mucosa erythematous and slightly friable, small amount of white discharge, cervix and uterus absent Lymph: no inguinal adenopathy   No results found for this or any previous visit (from the past 90 hour(s)). No results found.    Assessment and Plan: 76 y.o. female with   1. Subacute vaginitis - suspect vulvovaginal candidiasis. There may also be a component that is atrophic vaginitis. Patient's breast cancer history is a contraindication to HRT, but she may benefit from Intr[place] in the future - fluconazole (DIFLUCAN) 150 MG tablet; Take 1 tablet (150 mg total) by mouth once.  Dispense: 1 tablet; Refill: 0 - KOH and SureSwab, T.vaginalis RNA,Ql,Female  pending - Wear loose-fitting, undyed cotton underwear - Avoid prolonged pad use, especially scented pads - Avoid feminine deodorant sprays and douching - Symptomatic relief if needed with cold compress  Patient education and anticipatory guidance given Patient agrees with treatment plan Follow-up as needed if symptoms worsen or fail to improve  Darlyne Russian PA-C

## 2017-02-17 NOTE — Patient Instructions (Addendum)
- Take Fluconazole once - Eat Activia yogurt. Alternatively you can do over-the-counter Culturelle probiotics daily  - Wear loose-fitting, undyed cotton underwear - Avoid prolonged pad use, especially scented pads - Avoid feminine deodorant sprays and douching - Symptomatic relief if needed with cold compress    Vaginal Yeast infection, Adult Vaginal yeast infection is a condition that causes soreness, swelling, and redness (inflammation) of the vagina. It also causes vaginal discharge. This is a common condition. Some women get this infection frequently. What are the causes? This condition is caused by a change in the normal balance of the yeast (candida) and bacteria that live in the vagina. This change causes an overgrowth of yeast, which causes the inflammation. What increases the risk? This condition is more likely to develop in:  Women who take antibiotic medicines.  Women who have diabetes.  Women who take birth control pills.  Women who are pregnant.  Women who douche often.  Women who have a weak defense (immune) system.  Women who have been taking steroid medicines for a long time.  Women who frequently wear tight clothing. What are the signs or symptoms? Symptoms of this condition include:  White, thick vaginal discharge.  Swelling, itching, redness, and irritation of the vagina. The lips of the vagina (vulva) may be affected as well.  Pain or a burning feeling while urinating.  Pain during sex. How is this diagnosed? This condition is diagnosed with a medical history and physical exam. This will include a pelvic exam. Your health care provider will examine a sample of your vaginal discharge under a microscope. Your health care provider may send this sample for testing to confirm the diagnosis. How is this treated? This condition is treated with medicine. Medicines may be over-the-counter or prescription. You may be told to use one or more of the  following:  Medicine that is taken orally.  Medicine that is applied as a cream.  Medicine that is inserted directly into the vagina (suppository). Follow these instructions at home:  Take or apply over-the-counter and prescription medicines only as told by your health care provider.  Do not have sex until your health care provider has approved. Tell your sex partner that you have a yeast infection. That person should go to his or her health care provider if he or she develops symptoms.  Do not wear tight clothes, such as pantyhose or tight pants.  Avoid using tampons until your health care provider approves.  Eat more yogurt. This may help to keep your yeast infection from returning.  Try taking a sitz bath to help with discomfort. This is a warm water bath that is taken while you are sitting down. The water should only come up to your hips and should cover your buttocks. Do this 3-4 times per day or as told by your health care provider.  Do not douche.  Wear breathable, cotton underwear.  If you have diabetes, keep your blood sugar levels under control. Contact a health care provider if:  You have a fever.  Your symptoms go away and then return.  Your symptoms do not get better with treatment.  Your symptoms get worse.  You have new symptoms.  You develop blisters in or around your vagina.  You have blood coming from your vagina and it is not your menstrual period.  You develop pain in your abdomen. This information is not intended to replace advice given to you by your health care provider. Make sure you discuss any  questions you have with your health care provider. Document Released: 08/28/2005 Document Revised: 05/01/2016 Document Reviewed: 05/22/2015 Elsevier Interactive Patient Education  2017 [employer].

## 2017-02-18 LAB — SURESWAB, T.VAGINALIS RNA,QL,FEMALE: TRICHOMONAS VAGINALIS RNA: NOT DETECTED

## 2017-02-18 NOTE — Telephone Encounter (Signed)
LMTCB

## 2017-02-18 NOTE — Telephone Encounter (Signed)
Patient returned call She saw Nelson Chimes, PA yesterday - she has acute vaginitis - taking fluconazole She states she started having GU symptoms around the time she started HCTZ  She stopped HCTZ yesterday & resumed amlodipine 5mg  daily as of yesterday per PA Patient advised to continue with current plan and monitor home BPs Will route to MD as Juluis Rainier

## 2017-02-19 ENCOUNTER — Other Ambulatory Visit: Payer: Self-pay | Admitting: Osteopathic Medicine

## 2017-02-19 LAB — FUNGAL STAIN

## 2017-02-20 ENCOUNTER — Ambulatory Visit (INDEPENDENT_AMBULATORY_CARE_PROVIDER_SITE_OTHER): Payer: Medicare Other | Admitting: Sports Medicine

## 2017-02-20 ENCOUNTER — Encounter: Payer: Self-pay | Admitting: Sports Medicine

## 2017-02-20 DIAGNOSIS — M5416 Radiculopathy, lumbar region: Secondary | ICD-10-CM

## 2017-02-20 DIAGNOSIS — G20 Parkinson's disease: Secondary | ICD-10-CM | POA: Diagnosis not present

## 2017-02-20 DIAGNOSIS — G20C Parkinsonism, unspecified: Secondary | ICD-10-CM | POA: Insufficient documentation

## 2017-02-20 MED ORDER — GABAPENTIN 100 MG PO CAPS: 100.0000 mg | ORAL_CAPSULE | Freq: Two times a day (BID) | ORAL | 11 refills | Status: DC

## 2017-02-20 NOTE — Progress Notes (Signed)
   Subjective:    I'm seeing this patient as a consultation for:   Dana Chimes, PA-C  CC: Left leg pain  HPI: For months this pleasant 76 year old female has had pain that she localizes rate in from her left buttock down the back of her left thigh to the back of the left calf, occasionally to the foot but not to the toes. Worse with sitting for long periods of time. No bowel or bladder dysfunction, saddle numbness, constitutional symptoms.  While conducting her history of did notice a tremor in her left hand. She tells me this has been worked up to some degree with neurology, she had a negative nerve conduction and EMG, she also had a brain MRI that showed scattered white matter changes but no evidence of a stroke. 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  Past medical history:  Negative.  See flowsheet/record as well for more information.  Surgical history: Negative.  See flowsheet/record as well for more information.  Family history: Negative.  See flowsheet/record as well for more information.  Social history: Negative.  See flowsheet/record as well for more information.  Allergies, and medications have been entered into the medical record, reviewed, and no changes needed.   Review of Systems: No headache, visual changes, nausea, vomiting, diarrhea, constipation, dizziness, abdominal pain, skin rash, fevers, chills, night sweats, weight loss, swollen lymph nodes, body aches, joint swelling, muscle aches, chest pain, shortness of breath, mood changes, visual or auditory hallucinations.   Objective:   General: Well Developed, well nourished, and in no acute distress.  Neuro/Psych: Alert and oriented x3, extra-ocular muscles intact, able to move all 4 extremities, sensation grossly intact. 3 Hz pill rolling tremor of the left arm, less so on the right Skin: Warm and dry,  no rashes noted.  Respiratory: Not using accessory muscles, speaking in full sentences, trachea midline.  Cardiovascular: Pulses palpable, no extremity edema. Abdomen: Does not appear distended. Back Exam:  Inspection: Unremarkable  Motion: Flexion 45 deg, Extension 45 deg, Side Bending to 45 deg bilaterally,  Rotation to 45 deg bilaterally  SLR laying: Negative  XSLR laying: Negative  Palpable tenderness: None. FABER: negative. Sensory change: Gross sensation intact to all lumbar and sacral dermatomes.  Reflexes: 2+ at both patellar tendons, 2+ at achilles tendons, Babinski's downgoing.  Strength at foot  Plantar-flexion: 5/5 Dorsi-flexion: 5/5 Eversion: 5/5 Inversion: 5/5  Leg strength  Quad: 5/5 Hamstring: 5/5 Hip flexor: 5/5 Hip abductors: 5/5  Gait unremarkable.  Impression and Recommendations:   This case required medical decision making of moderate complexity.  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 She does have 59 allergies most of which I think are not real. Also asked her to stop the primidone which would be effective only for a benign essential tremor.  Left lumbar radiculitis Suspect L5 versus S1 distribution. She did have degenerative changes on her lumbar spine x-ray, adding a lumbar spine MRI, return to go over results. Starting gabapentin 100 mg daily, increase to twice a day in a week.

## 2017-02-20 NOTE — Addendum Note (Signed)
Addended by: Elizabeth Sauer on: 02/20/2017 03:21 PM   Modules accepted: Orders

## 2017-02-20 NOTE — Assessment & Plan Note (Signed)
Patient needs to take her Mirapex 0.125 mg. 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 She does have 59 allergies most of which I think are not real. Also asked her to stop the primidone which would be effective only for a benign essential tremor.

## 2017-02-20 NOTE — Assessment & Plan Note (Signed)
Suspect L5 versus S1 distribution. She did have degenerative changes on her lumbar spine x-ray, adding a lumbar spine MRI, return to go over results. Starting gabapentin 100 mg daily, increase to twice a day in a week.

## 2017-02-21 ENCOUNTER — Telehealth: Payer: Self-pay

## 2017-02-21 NOTE — Telephone Encounter (Signed)
The only available tablets are in 600 mg and 800 mg. Tell her to just take the pill, it's the lowest possible dose and it can be given to a child.

## 2017-02-21 NOTE — Telephone Encounter (Signed)
Pt left VM stating the gabapentin is in a capsule and she would like to have tablets so she can take half because she's scared of possible side effects. Please advise.

## 2017-02-24 ENCOUNTER — Telehealth: Payer: Self-pay

## 2017-02-24 ENCOUNTER — Ambulatory Visit (INDEPENDENT_AMBULATORY_CARE_PROVIDER_SITE_OTHER): Payer: Medicare Other

## 2017-02-24 DIAGNOSIS — M5416 Radiculopathy, lumbar region: Secondary | ICD-10-CM

## 2017-02-24 DIAGNOSIS — M48061 Spinal stenosis, lumbar region without neurogenic claudication: Secondary | ICD-10-CM | POA: Diagnosis not present

## 2017-02-24 NOTE — Telephone Encounter (Signed)
Pt came up from MRI and said that she felt she had an allergic reaction to Gabapentin.  She only took one dose Friday night, and stated that it caused her arm to shake, along with chest discomfort, and leg pain.  Adele Barthel, CMA, NRT spoke with Dr. Dianah Field and he said for patient to continue medication.  Estill Bamberg told pt that if she has SOB, Hives or difficulty breathing to go to the nearest ER.

## 2017-02-24 NOTE — Telephone Encounter (Signed)
Left VM with information.  

## 2017-02-24 NOTE — Telephone Encounter (Signed)
Patient is not allergic to this medication, symptoms are most likely due to anxiety, she had a recent stress test in 2015 that was negative, so it is highly unlikely that this represents angina.

## 2017-02-25 ENCOUNTER — Encounter: Payer: Self-pay | Admitting: Sports Medicine

## 2017-02-25 ENCOUNTER — Ambulatory Visit (INDEPENDENT_AMBULATORY_CARE_PROVIDER_SITE_OTHER): Payer: Medicare Other | Admitting: Sports Medicine

## 2017-02-25 DIAGNOSIS — G20C Parkinsonism, unspecified: Secondary | ICD-10-CM

## 2017-02-25 DIAGNOSIS — G20 Parkinson's disease: Secondary | ICD-10-CM

## 2017-02-25 DIAGNOSIS — M5416 Radiculopathy, lumbar region: Secondary | ICD-10-CM

## 2017-02-25 MED ORDER — PRAMIPEXOLE DIHYDROCHLORIDE 0.125 MG PO TABS: 0.1250 mg | ORAL_TABLET | Freq: Three times a day (TID) | ORAL | 3 refills | Status: DC

## 2017-02-25 NOTE — Addendum Note (Signed)
Addended by: Elizabeth Sauer on: 02/25/2017 03:49 PM   Modules accepted: Orders

## 2017-02-25 NOTE — Assessment & Plan Note (Signed)
Needs to go up on dose of pramipexole.  Increasing to twice a day and then 3 times a day if needed, further follow-up with neurologist.

## 2017-02-25 NOTE — Assessment & Plan Note (Signed)
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 This is the clear cause of her symptoms. Unfortunately she is too scared to do an epidural, and has failed physical therapy and oral medications, I did advise her that this was all elective and that if she changed her mind or felt that she couldn't live that the pain anymore she can contact me. I am going to discharge her from my care for lumbar radiculopathy at this time. She also had similar symptoms, periscapular radicular pain but also tells me that because she would never want an epidural, and she has failed rehabilitation exercises that we can just leave this alone for now.

## 2017-02-25 NOTE — Progress Notes (Signed)
  Subjective:    CC: Follow-up  HPI: Dana Calderon had her MRI, she did have left-sided radicular pain, she continues to have pain but declines epidurals. At this point she feels that she can live with the symptoms.  Parkinsonism: Improved slightly with low-dose pramipexole, agrees to go up on the dose.  Past medical history:  Negative.  See flowsheet/record as well for more information.  Surgical history: Negative.  See flowsheet/record as well for more information.  Family history: Negative.  See flowsheet/record as well for more information.  Social history: Negative.  See flowsheet/record as well for more information.  Allergies, and medications have been entered into the medical record, reviewed, and no changes needed.   Review of Systems: No fevers, chills, night sweats, weight loss, chest pain, or shortness of breath.   Objective:    General: Well Developed, well nourished, and in no acute distress.  Neuro: Alert and oriented x3, extra-ocular muscles intact, sensation grossly intact.  HEENT: Normocephalic, atraumatic, pupils equal round reactive to light, neck supple, no masses, no lymphadenopathy, thyroid nonpalpable.  Skin: Warm and dry, no rashes. Cardiac: Regular rate and rhythm, no murmurs rubs or gallops, no lower extremity edema.  Respiratory: Clear to auscultation bilaterally. Not using accessory muscles, speaking in full sentences.  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 This is the clear cause of her symptoms. Unfortunately she is too scared to do an epidural, and has failed physical therapy and oral medications, I did advise her that this was all elective and that if she changed her mind or felt that she couldn't live that the pain anymore she can contact me. I am going to discharge her from my care for lumbar radiculopathy at this time. She also had similar symptoms, periscapular radicular pain but  also tells me that because she would never want an epidural, and she has failed rehabilitation exercises that we can just leave this alone for now.  Parkinsonian tremor (Dana Calderon) Needs to go up on dose of pramipexole.  Increasing to twice a day and then 3 times a day if needed, further follow-up with neurologist.  I spent 25 minutes with this patient, greater than 50% was face-to-face time counseling regarding the above diagnoses

## 2017-02-26 ENCOUNTER — Telehealth: Payer: Self-pay

## 2017-02-26 DIAGNOSIS — M5416 Radiculopathy, lumbar region: Secondary | ICD-10-CM

## 2017-02-26 NOTE — Telephone Encounter (Signed)
Pt left VM stating her leg is really killing her and would like to know more about the procedure you recommended. Pt states she's scared but the pain is unbearable. Please advise.

## 2017-02-27 NOTE — Telephone Encounter (Signed)
[facility] called and pt notified. Pt states pain is better today and she will "hold off" for now.

## 2017-02-27 NOTE — Telephone Encounter (Signed)
Orders placed for left L4-L5 interlaminar epidural.

## 2017-03-03 ENCOUNTER — Telehealth: Payer: Self-pay

## 2017-03-03 ENCOUNTER — Other Ambulatory Visit: Payer: Self-pay | Admitting: Physician Assistant

## 2017-03-03 DIAGNOSIS — I10 Essential (primary) hypertension: Secondary | ICD-10-CM

## 2017-03-03 NOTE — Telephone Encounter (Signed)
Pt.notified

## 2017-03-03 NOTE — Telephone Encounter (Signed)
I have already placed an order for the hospital bed. Also I think a handicap sticker would be counterproductive, I want her as active as much as possible.

## 2017-03-03 NOTE — Telephone Encounter (Signed)
Pt left VM asking if she can get an order for a bed to support her back pain. Pt would also like to know if she's a candidate for a handicap sticker/placard. Please advise.

## 2017-03-07 ENCOUNTER — Telehealth: Payer: Self-pay | Admitting: Sports Medicine

## 2017-03-07 NOTE — Telephone Encounter (Signed)
Pt reports since she started taking the MIRAPEX Rx she has been very nauseous and her legs/feet are swelling. Pt reports she started at 1 tab daily, but has worked up to 3 tabs daily. Routing for recommendation.

## 2017-03-07 NOTE — Telephone Encounter (Signed)
Dana Calderon will have an "adverse effect" with everything.  Have her push through it and take some zofran if needed.  Is the tremor any better?

## 2017-03-07 NOTE — Telephone Encounter (Signed)
Pt advised.

## 2017-03-10 ENCOUNTER — Telehealth: Payer: Self-pay

## 2017-03-10 ENCOUNTER — Other Ambulatory Visit: Payer: Self-pay

## 2017-03-10 ENCOUNTER — Other Ambulatory Visit: Payer: Self-pay | Admitting: Physician Assistant

## 2017-03-10 ENCOUNTER — Encounter: Payer: Self-pay | Admitting: Cardiology

## 2017-03-10 MED ORDER — FLUCONAZOLE 150 MG PO TABS: 150.0000 mg | ORAL_TABLET | Freq: Once | ORAL | 0 refills | Status: DC

## 2017-03-10 MED ORDER — FLUCONAZOLE 150 MG PO TABS: 150.0000 mg | ORAL_TABLET | Freq: Once | ORAL | 0 refills | Status: AC

## 2017-03-10 NOTE — Telephone Encounter (Signed)
Let Dana Calderon know that we can trial Diflucan one more time. Her last wet prep did not show any yeast, so I do recommend she follow-up with GYN if she continues to have symptoms after this dose

## 2017-03-10 NOTE — Progress Notes (Signed)
Patient continues to experience vaginal discomfort and itching. Testing has been negative except for a few WBC's on wet prep. Will trial Diflucan x 1 and refer to GYN if symptoms persist.

## 2017-03-10 NOTE — Telephone Encounter (Signed)
Pt notified and verbalized understanding.  Rx resent to correct pharmacy. -EH/RMA

## 2017-03-10 NOTE — Telephone Encounter (Signed)
Pt reports she is still having some vaginal irritation. She stated she found some of her old medical records and noticed that she has taken diflucan in the past. She wants a Rx of that and a referral to OBGY. Please advise. -EH/RMA

## 2017-03-17 NOTE — Progress Notes (Deleted)
    HPI: FU hypertension. Patient previously followed in [place]. Patient had an exercise treadmill in January 2015 that was negative but submaximal. Echo 3/18 showed normal LV systolic function, grade 2 DD. She has multiple documented medication intolerances but states she can typically tolerate lower doses.   Current Outpatient Prescriptions  Medication Sig Dispense Refill  . ALPRAZolam (XANAX) 0.25 MG tablet TAKE ONE TABLET BY MOUTH AT BEDTIME AS NEEDED FOR ANXIETY 30 tablet 0  . amLODipine (NORVASC) 5 MG tablet Take 5 mg by mouth daily.    . BYSTOLIC 20 MG TABS TAKE TWO TABLETS BY MOUTH EVERY DAY 60 tablet 3  . Ciclopirox (CNL8 NAIL) 8 % KIT Apply 1 application topically 2 (two) times daily. (Patient not taking: Reported on 02/25/2017) 1 each 3  . diphenoxylate-atropine (LOMOTIL) 2.5-0.025 MG per tablet Take 1 tablet by mouth 4 (four) times daily as needed for diarrhea or loose stools.     . gabapentin (NEURONTIN) 100 MG capsule Take 1 capsule (100 mg total) by mouth 2 (two) times daily. (Patient not taking: Reported on 02/25/2017) 60 capsule 11  . lisinopril (PRINIVIL,ZESTRIL) 40 MG tablet TAKE ONE TABLET BY MOUTH EVERY DAY 30 tablet 0  . loratadine (CLARITIN) 10 MG tablet Take 1 tablet (10 mg total) by mouth daily. 10 tablet 0  . meclizine (ANTIVERT) 25 MG tablet Take 1 tablet (25 mg total) by mouth 3 (three) times daily as needed for dizziness. 30 tablet 0  . Multiple Minerals-Vitamins (CALCIUM-MAGNESIUM-ZINC-D3 PO) Take 1 tablet by mouth 2 (two) times daily.    . Multiple Vitamin (MULTIVITAMIN) tablet Take 1 tablet by mouth daily.    . omeprazole (PRILOSEC) 20 MG capsule TAKE ONE CAPSULE BY MOUTH EVERY DAY 90 capsule 0  . OVER THE COUNTER MEDICATION Take 1 capsule by mouth daily. "Mega Red"    . Polyethylene Glycol 400 (BLINK TEARS OP) Place 1 drop into both eyes as needed (for dry eyes).    . pramipexole (MIRAPEX) 0.125 MG tablet Take 1 tablet (0.125 mg total) by mouth 3 (three)  times daily. 90 tablet 3  . sodium chloride (OCEAN) 0.65 % SOLN nasal spray Place 1 spray into both nostrils as needed for congestion. 15 mL 0  . valACYclovir (VALTREX) 500 MG tablet TAKE ONE TABLET BY MOUTH 2 TIMES A DAY. MAY REFILL IF NEW OUTBREAK OCCURS. 6 tablet 2   No current facility-administered medications for this visit.      Past Medical History:  Diagnosis Date  . Anxiety   . Back pain, chronic   . Breast cancer ([facility]CC)    left  . GERD (gastroesophageal reflux disease)   . Hemorrhoid   . Hypertension   . IBS (irritable bowel syndrome)   . Migraine   . Vertigo     Past Surgical History:  Procedure Laterality Date  . ABDOMINAL HYSTERECTOMY    . BREAST SURGERY    . HERNIA REPAIR    . KNEE ARTHROSCOPY Left   . LUMBAR DISC SURGERY    . MASTECTOMY Left   . TONSILLECTOMY    . TUBAL LIGATION      Social History   Social History  . Marital status: Divorced    Spouse name: N/A  . Number of children: 1  . Years of education: N/A   Occupational History  . Not on file.   Social History Main Topics  . Smoking status: Never Smoker  . Smokeless tobacco: Never Used  . Alcohol use No  .   Drug use: No  . Sexual activity: Not Currently   Other Topics Concern  . Not on file   Social History Narrative  . No narrative on file    Family History  Problem Relation Age of Onset  . Hypertension Mother   . Stroke Father     ROS: no fevers or chills, productive cough, hemoptysis, dysphasia, odynophagia, melena, hematochezia, dysuria, hematuria, rash, seizure activity, orthopnea, PND, pedal edema, claudication. Remaining systems are negative.  Physical Exam: Well-developed well-nourished in no acute distress.  Skin is warm and dry.  HEENT is normal.  Neck is supple.  Chest is clear to auscultation with normal expansion.  Cardiovascular exam is regular rate and rhythm.  Abdominal exam nontender or distended. No masses palpated. Extremities show no edema. neuro  grossly intact  ECG- personally reviewed  A/P  1  Brian Crenshaw, MD    

## 2017-03-25 ENCOUNTER — Telehealth: Payer: Self-pay

## 2017-03-25 NOTE — Telephone Encounter (Signed)
Let Dana Calderon know she is always welcome to make an appointment to come see me. If she would like to try managing this outside the office, my recommendation would be to trial coming off of the Mirapex for now and leaving her Amlodipine dose as is and see what her symptoms are like in the next few days and call us back.

## 2017-03-25 NOTE — Telephone Encounter (Signed)
Pt reports burning in bilateral feet for the past few weeks.  She believes that amlodipine 5 mg (.5 tablet daily) and Mirapex 0.125 mg (1.5 tablet daily) is causing this matter. She is asking should she come in to be seen or could this be resolved over the phone with some medical recommendations.  Pt also stated that she do not want to see any other specialist she is wanting PCP to handle all medical issues. Will route to PCP. -EH/RMA

## 2017-03-26 ENCOUNTER — Ambulatory Visit: Payer: Medicare Other | Admitting: Cardiology

## 2017-03-26 NOTE — Telephone Encounter (Signed)
Pt notified of recommendations listed below. -EH/RMA  

## 2017-04-01 ENCOUNTER — Ambulatory Visit (INDEPENDENT_AMBULATORY_CARE_PROVIDER_SITE_OTHER): Payer: Medicare Other | Admitting: Physician Assistant

## 2017-04-01 ENCOUNTER — Telehealth: Payer: Self-pay | Admitting: Physician Assistant

## 2017-04-01 ENCOUNTER — Other Ambulatory Visit: Payer: Self-pay | Admitting: Physician Assistant

## 2017-04-01 VITALS — BP 164/85 | HR 64 | Wt 193.0 lb

## 2017-04-01 DIAGNOSIS — K580 Irritable bowel syndrome with diarrhea: Secondary | ICD-10-CM

## 2017-04-01 DIAGNOSIS — R600 Localized edema: Secondary | ICD-10-CM

## 2017-04-01 DIAGNOSIS — G252 Other specified forms of tremor: Secondary | ICD-10-CM

## 2017-04-01 DIAGNOSIS — R259 Unspecified abnormal involuntary movements: Secondary | ICD-10-CM | POA: Diagnosis not present

## 2017-04-01 DIAGNOSIS — R7303 Prediabetes: Secondary | ICD-10-CM | POA: Diagnosis not present

## 2017-04-01 DIAGNOSIS — I10 Essential (primary) hypertension: Secondary | ICD-10-CM

## 2017-04-01 MED ORDER — DIPHENOXYLATE-ATROPINE 2.5-0.025 MG PO TABS: 1.0000 | ORAL_TABLET | Freq: Every day | ORAL | 2 refills | Status: DC | PRN

## 2017-04-01 MED ORDER — LISINOPRIL 40 MG PO TABS: 40.0000 mg | ORAL_TABLET | Freq: Every day | ORAL | 1 refills | Status: DC

## 2017-04-01 NOTE — Telephone Encounter (Signed)
Received fax from Medicare D stating they denied diphenoxylate/atropine 2.5 due to not received enough information. - CF  Reference #: [id number]

## 2017-04-01 NOTE — Patient Instructions (Addendum)
Diet for Irritable Bowel Syndrome When you have irritable bowel syndrome (IBS), the foods you eat and your eating habits are very important. IBS may cause various symptoms, such as abdominal pain, constipation, or diarrhea. Choosing the right foods can help ease discomfort caused by these symptoms. Work with your health care provider and dietitian to find the best eating plan to help control your symptoms. What general guidelines do I need to follow?  Keep a food diary. This will help you identify foods that cause symptoms. Write down:  What you eat and when.  What symptoms you have.  When symptoms occur in relation to your meals.  Avoid foods that cause symptoms. Talk with your dietitian about other ways to get the same nutrients that are in these foods.  Eat more foods that contain fiber. Take a fiber supplement if directed by your dietitian.  Eat your meals slowly, in a relaxed setting.  Aim to eat 5-6 small meals per day. Do not skip meals.  Drink enough fluids to keep your urine clear or pale yellow.  Ask your health care provider if you should take an over-the-counter probiotic during flare-ups to help restore healthy gut bacteria.  If you have cramping or diarrhea, try making your meals low in fat and high in carbohydrates. Examples of carbohydrates are pasta, rice, whole grain breads and cereals, fruits, and vegetables.  If dairy products cause your symptoms to flare up, try eating less of them. You might be able to handle yogurt better than other dairy products because it contains bacteria that help with digestion. What foods are not recommended? The following are some foods and drinks that may worsen your symptoms:  Fatty foods, such as [place] fries.  Milk products, such as cheese or ice cream.  Chocolate.  Alcohol.  Products with caffeine, such as coffee.  Carbonated drinks, such as soda. The items listed above may not be a complete list of foods and beverages to  avoid. Contact your dietitian for more information.  What foods are good sources of fiber? Your health care provider or dietitian may recommend that you eat more foods that contain fiber. Fiber can help reduce constipation and other IBS symptoms. Add foods with fiber to your diet a little at a time so that your body can get used to them. Too much fiber at once might cause gas and swelling of your abdomen. The following are some foods that are good sources of fiber:  Apples.  Peaches.  Pears.  Berries.  Figs.  Broccoli (raw).  Cabbage.  Carrots.  Raw peas.  Kidney beans.  Lima beans.  Whole grain bread.  Whole grain cereal. Where to find more information: [employer] for Functional Gastrointestinal Disorders: www.iffgd.[employer] of Diabetes and Digestive and Kidney Diseases: NetworkAffair.co.za.aspx This information is not intended to replace advice given to you by your health care provider. Make sure you discuss any questions you have with your health care provider. Document Released: 02/08/2004 Document Revised: 04/25/2016   Document Reviewed: 02/18/2014 Elsevier Interactive Patient Education  2017 [place] disease is a long-term (chronic) condition that gets worse over time (is progressive). Parkinson disease limits your ability to control your movements and move your body normally. This condition is a type of movement disorder. Each person with Parkinson disease is affected differently. The condition can range from mild to severe. Parkinson disease tends to progress slowly over several years. What are the causes? Parkinson disease results from  a loss of brain cells (neurons) in a specific part of the brain (substantia nigra). Some of the neurons in the substantia nigra make an important brain chemical (dopamine). Dopamine is needed to control  movement. As the condition gets worse, neurons make less dopamine. This makes it hard to move or control your movements. The exact cause of why neurons are lost or produce less dopamine is not known. Genetic and environmental factors may contribute to the cause of Parkinson disease. What increases the risk? This condition is more likely to develop in:  Men.  People who are 50 years of age or older.  People who have a family history of Parkinson disease. What are the signs or symptoms? Symptoms of this condition can vary from person to person. The main (primary) symptoms are related to movement (motor symptoms). These include:  Uncontrolled shaking movements (tremor). Tremors usually start in a hand or foot when you are resting (resting tremor). The tremor may stop when you move around.  Slowing of movement. You may lose facial expression and have trouble making the small movements that are needed to button clothing or brush your teeth. You may walk with short, shuffling steps.  Stiff movement (rigidity). This mostly affects your arms, legs, neck, and upper body. You may walk without swinging your arms. Rigidity can be painful.  Loss of balance and stability when standing. You may sway, fall backward, and have trouble making turns. Secondary motor symptoms of this condition include:  Shrinking handwriting.  Stooped posture.  Slowed speech.  Trouble swallowing.  Drooling.  Sexual dysfunction.  Muscle cramps.  Loss of smell. Additional symptoms that are not related to movement include:  Constipation.  Mood swings.  Depression or anxiety.  Sleep disturbances.  Confusion.  Loss of mental abilities (dementia).  Low blood pressure.  Trouble concentrating. How is this diagnosed? Parkinson disease can be hard to diagnose in its early stages. A diagnosis may be made based on symptoms, a medical history, and physical exam. During your exam, your health care provider will  look for:  Lack of facial expression.  Resting tremor.  Stiffness in your neck, arms, and legs.  Abnormal walk.  Trouble with balance. You may have brain imaging tests done to check for a loss of dopamine-producing areas of the brain. Your healthcare provider may also grade the severity of your condition as mild, moderate, or advanced. Parkinson disease progression is different for everyone. You may not progress to the advanced stage. Mild Parkinson disease involves:  Movement problems that do not affect daily activities.  Movement problems on one side of the body.  Movement problems that are controlled with medicines.  Good response to exercise. Moderate Parkinson disease involves:  Movement problems on both sides of the body.  Slowing of movement.  Coordination and balance problems.  Less of a response to medicine.  More side effects from medicines. Advanced Parkinson disease involves:  Extreme difficulty walking.  Inability to live alone safely.  Signs of dementia.  Difficulty controlling symptoms with medicine. How is this treated? There is no cure for Parkinson disease. Treatment focuses on relieving your symptoms. Treatment may include:  Medicines.  Speech, occupational, and physical therapy.  Surgery. Everyone responds to medicines differently. Your response may change over time. Work with your health care provider to find the best medicines for you. These may include:  Dopamine replacement drugs. These are the most effective medicines. A long-term side effect of these medicines is uncontrolled movements (dyskinesias).  Dopamine  agonists. These drugs act like dopamine to stimulate dopamine receptors in the brain. Side effects include nausea and sleepiness, but they cause less dyskinesia.  Other medicines to reduce tremor, prevent dopamine breakdown, reduce dyskinesia, and reduce dementia that is related to Parkinson disease. Another treatment is deep  brain stimulation surgery to reduce tremors and dyskinesia. This procedure involves placing electrodes in the brain. The electrodes are attached to an electric pulse generator that acts like a pacemaker for your brain. This may be an option if you have had the condition for at least four years and are not responding well to medicines. Follow these instructions at home:  Take over-the-counter and prescription medicines only as told by your health care provider.  Install grab bars and railings in your home to prevent falls.  Follow instructions from your health care provider about eating or drinking restrictions.  Return to your normal activities as told by your health care provider. Ask your health care provider what activities are safe for you.  Get regular exercise as told by your health care provider or a physical therapist.  Keep all follow-up visits as told by your health care provider. This is important. These include any visits with a speech therapist or occupational therapist.  Consider joining a support group for people with Parkinson disease. Contact a health care provider if:  Medicines do not help your symptoms.  You are unsteady or have fallen at home.  You need more support to function well at home.  You have trouble swallowing.  You have severe constipation.  You are struggling with side effects from your medicines.  You see or hear things that are not real (hallucinate).  You feel confused, anxious, or depressed. Get help right away if:  You are injured after a fall.  You cannot swallow without choking.  You have chest pain or trouble breathing.  You do not feel safe at home. This information is not intended to replace advice given to you by your health care provider. Make sure you discuss any questions you have with your health care provider. Document Released: 11/15/2000 Document Revised: 04/22/2016 Document Reviewed: 09/08/2015 Elsevier Interactive  Patient Education  2017 [employer].

## 2017-04-01 NOTE — Progress Notes (Signed)
HPI:                                                                Dana Calderon is a 76 y.o. female who presents to [facility] today for medication refills, leg swelling, tremor  Patient has a left-sided resting tremor that has been evaluated by [facility] neurology, Dr. Cammy Brochure, most recently on 12/27/16. Neurology felt it had a Parkinsonian quality. She was prescribed Sinemet but self-discontinued due to reported itching in throat. She was most recently taking Mirapex 0.125mg , 1/2 tablet bid for approx. 1 month and reports a number of problems today that she attributes to this medication. She states medication causes GI upset, cough, sleep disturbance, and she feels it actually worsens her tremor. She self-discontinued Mirapex on 03/29/17. She is also requesting a referral to a different neurologist within the [facility] system.  Patient also reports unilateral right lower leg and foot swelling for approximately 1 month. She denies any claudication or calf pain. She denies any injury or trauma. She does endorse a burning sensation in her feet bilaterally.   Additionally, patient is requesting a prescription for Lomotil. She has been prescribed this by GI in the past for IBS with diarrhea. She feels this is the only medicine that works well at controlling her symptoms without side effect. Patient has reported worsening diarrhea and bloating, and has had several episodes of bowel incontinence. She denies fever, chills, abdominal pain, hematochezia, or melena. She had a negative colonoscopy in 2015, with a negative biopsy w/no evidence of microscopic colitis. She was last seen by Dr. Bryn Gulling, [facility], on 10/02/2015. At that time she was prescribed Viberzi, but this caused tinnitus.   Lastly, patient requesting a refill of her Lisinopril. Reports that her BP is "140/78 most of the time."  Past Medical History:  Diagnosis Date  . Anxiety   . Back pain, chronic    . Breast cancer ([place])    left  . GERD (gastroesophageal reflux disease)   . Hemorrhoid   . Hypertension   . IBS (irritable bowel syndrome)   . Migraine   . Vertigo    Past Surgical History:  Procedure Laterality Date  . ABDOMINAL HYSTERECTOMY    . BREAST SURGERY    . HERNIA REPAIR    . KNEE ARTHROSCOPY Left   . LUMBAR DISC SURGERY    . MASTECTOMY Left   . TONSILLECTOMY    . TUBAL LIGATION     Social History  Substance Use Topics  . Smoking status: Never Smoker  . Smokeless tobacco: Never Used  . Alcohol use No   family history includes Hypertension in her mother; Stroke in her father.  ROS: negative except as noted in the HPI  Medications: Current Outpatient Prescriptions  Medication Sig Dispense Refill  . ALPRAZolam (XANAX) 0.25 MG tablet TAKE ONE TABLET BY MOUTH AT BEDTIME AS NEEDED FOR ANXIETY 30 tablet 0  . amLODipine (NORVASC) 5 MG tablet Take 5 mg by mouth daily.    Marland Kitchen BYSTOLIC 20 MG TABS TAKE TWO TABLETS BY MOUTH EVERY DAY 60 tablet 3  . diphenoxylate-atropine (LOMOTIL) 2.5-0.025 MG tablet Take 1 tablet by mouth daily as needed for diarrhea or loose stools. 30 tablet 2  .  loratadine (CLARITIN) 10 MG tablet Take 1 tablet (10 mg total) by mouth daily. 10 tablet 0  . meclizine (ANTIVERT) 25 MG tablet Take 1 tablet (25 mg total) by mouth 3 (three) times daily as needed for dizziness. 30 tablet 0  . Multiple Minerals-Vitamins (CALCIUM-MAGNESIUM-ZINC-D3 PO) Take 1 tablet by mouth 2 (two) times daily.    . Multiple Vitamin (MULTIVITAMIN) tablet Take 1 tablet by mouth daily.    Marland Kitchen omeprazole (PRILOSEC) 20 MG capsule TAKE ONE CAPSULE BY MOUTH EVERY DAY 90 capsule 0  . OVER THE COUNTER MEDICATION Take 1 capsule by mouth daily. "Mega Red"    . Polyethylene Glycol 400 (BLINK TEARS OP) Place 1 drop into both eyes as needed (for dry eyes).    . sodium chloride (OCEAN) 0.65 % SOLN nasal spray Place 1 spray into both nostrils as needed for congestion. 15 mL 0  . valACYclovir  (VALTREX) 500 MG tablet TAKE ONE TABLET BY MOUTH 2 TIMES A DAY. MAY REFILL IF NEW OUTBREAK OCCURS. 6 tablet 2  . lisinopril (PRINIVIL,ZESTRIL) 40 MG tablet Take 1 tablet (40 mg total) by mouth daily. 90 tablet 1   No current facility-administered medications for this visit.    Allergies  Allergen Reactions  . Cefuroxime Axetil Shortness Of Breath    Has had cephalexin (Keflex) without side effects  . Edarbi [Azilsartan] Other (See Comments)    Heart pounding, foot swelling  . Methylprednisolone Other (See Comments) and Hypertension    Raised blood pressure and sugar  . Nystop [Nystatin] Other (See Comments)    Burning skin and breathing problems  . Penicillins Other (See Comments)    Face broke out in lumps  . Red Dye Itching, Rash and Other (See Comments)    Fainting  . Tamiflu [Oseltamivir Phosphate] Diarrhea and Other (See Comments)    Fainting  . Verelan [Verapamil Hcl Er] Shortness Of Breath  . Zithromax [Azithromycin] Shortness Of Breath  . Carbidopa-Levodopa Itching and Other (See Comments)    Itchy ears, throat and tongue   . Doxazosin Mesylate Other (See Comments)    Fast pulse  . Hyoscyamine Other (See Comments)    Shakes, dizzy and headache  . Influenza Vaccines Other (See Comments)    "head in a vice", nasal itching  . Other Other (See Comments)    Cardiolyte injection - burning through body  . Asa [Aspirin] Rash  . Atenolol Other (See Comments)    Unknown  . Avelox [Moxifloxacin] Other (See Comments)    Unknown  . Benicar [Olmesartan] Other (See Comments)    Unknown  . Bentyl [Dicyclomine Hcl] Other (See Comments)    Headache and high BP  . Benzonatate Other (See Comments)    Unknown  . Bisoprolol Other (See Comments)    Unknown  . Cardizem [Diltiazem Hcl] Other (See Comments)    Unknown  . Carvedilol Other (See Comments)    Unknown  . Cefdinir Other (See Comments)    Unknown  . Chlorthalidone Other (See Comments)    Unknown  . Ciprofloxacin Other  (See Comments)    Heartburn  . Citalopram Other (See Comments)    Unknown  . Clonidine Derivatives Other (See Comments)    Unknown  . Diovan [Valsartan] Other (See Comments)    Unknown  . Doxycycline Other (See Comments)    Headache  . Erythromycin Other (See Comments)    Unknown  . Fioricet [Butalbital-Apap-Caffeine] Other (See Comments)    Headache and dizzy  . Flexeril [Cyclobenzaprine] Other (  See Comments)    Unknown  . Furosemide Other (See Comments)    Unknown  . Hydralazine Other (See Comments)    Headache  . Hydroxyzine Other (See Comments)    nightmares  . Isosorbide Other (See Comments)    Unknown  . Lyrica [Pregabalin] Other (See Comments)    Unknown  . Macrobid [employer] Macro] Other (See Comments)    Unknown  . Mavik [Trandolapril] Other (See Comments)    Unknown  . Metronidazole Itching and Other (See Comments)    Dizzy  . Naproxen Other (See Comments)    Unknown  . Neo-Polycin Hc [Bacitra-Neomycin-Polymyxin-Hc] Itching  . Nexium [Esomeprazole Magnesium] Other (See Comments)    Headache  . Norflex [Orphenadrine Citrate] Other (See Comments)  . Oxybutynin Chloride Er Other (See Comments)    Unknown  . Paxil [Paroxetine Hcl] Other (See Comments)    Unknown  . Phenazopyridine Other (See Comments)    Unknown  . Prazosin Other (See Comments)    Unknown  . Prednisone Other (See Comments)    Unknown  . Promethazine Other (See Comments)    Unknown  . Prozac [Fluoxetine Hcl] Other (See Comments)    Unknown  . Spironolactone Other (See Comments)    Unknown  . Sulfur Other (See Comments)    Unknown  . Tizanidine Other (See Comments)    Unknown  . Valium [Diazepam] Other (See Comments)    "Too strong?"  . Zoloft [Sertraline Hcl] Other (See Comments)    Unknown       Objective:  BP (!) 164/85   Pulse 64   Wt 193 lb (87.5 kg)   BMI 31.15 kg/m  Gen: well-groomed, cooperative, not ill-appearing, no distress Pulm: Normal work of  breathing, normal phonation, clear to auscultation bilaterally CV: Normal rate, regular rhythm, s1 and s2 distinct, no murmurs, clicks or rubs  Neuro: alert and oriented x 3, sensation of right foot and lower extremity grossly intact MSK: right non-pitting pedal edema present to the level of the right distal tibia/fibula, calf non-tender, no erythema or warmth Skin: warm, dry, intact; no rashes or lesions on exposed skin Psych: good eye contact, normal affect, euthymic mood, normal speech and thought content     No results found.    Assessment and Plan: 76 y.o. female with   1. Essential hypertension BP Readings from Last 3 Encounters:  04/01/17 (!) 164/85  02/25/17 (!) 155/77  02/20/17 (!) 159/76  - patient's home BP readings have been at goal since starting Amlodipine 5mg  daily - Lisinopril 40mg  daily - cont Amlodipine 5mg  and Bystolic 20mg  daily  2. Irritable bowel syndrome with diarrhea - explained that Lomotil will require PA. She can use OTC Imodium for now - if symptoms persist, recommend we perform stool culture and C. Diff RPR - diphenoxylate-atropine (LOMOTIL) 2.5-0.025 MG tablet; Take 1 tablet by mouth daily as needed for diarrhea or loose stools.  Dispense: 30 tablet; Refill: 2 - CBC - COMPLETE METABOLIC PANEL WITH GFR  3. Resting tremor - Ambulatory referral to Neurology ([facility]) - discontinuing Mirapex due to intolerance  4. Prediabetes - Hemoglobin A1c to evaluate bilateral foot paresthesias/burning pain  5. Edema of right lower extremity - US Venous Img Lower Bilateral; Future to r/o DVT  Patient education and anticipatory guidance given Patient agrees with treatment plan Follow-up in 3 months or sooner as needed if symptoms worsen or fail to improve  Darlyne Russian PA-C

## 2017-04-01 NOTE — Telephone Encounter (Signed)
I filled out appeal form for medication and faxed it to insurance now waiting on response. - CF

## 2017-04-02 ENCOUNTER — Other Ambulatory Visit: Payer: Self-pay

## 2017-04-02 ENCOUNTER — Ambulatory Visit (HOSPITAL_BASED_OUTPATIENT_CLINIC_OR_DEPARTMENT_OTHER): Admission: RE | Admit: 2017-04-02 | Payer: Medicare Other | Source: Ambulatory Visit

## 2017-04-02 DIAGNOSIS — I10 Essential (primary) hypertension: Secondary | ICD-10-CM

## 2017-04-02 LAB — CBC
HCT: 42 % (ref 35.0–45.0)
HEMOGLOBIN: 13.9 g/dL (ref 11.7–15.5)
MCH: 29.2 pg (ref 27.0–33.0)
MCHC: 33.1 g/dL (ref 32.0–36.0)
MCV: 88.2 fL (ref 80.0–100.0)
MPV: 9.8 fL (ref 7.5–12.5)
Platelets: 246 10*3/uL (ref 140–400)
RBC: 4.76 MIL/uL (ref 3.80–5.10)
RDW: 14.4 % (ref 11.0–15.0)
WBC: 11.7 10*3/uL — AB (ref 3.8–10.8)

## 2017-04-02 LAB — COMPLETE METABOLIC PANEL WITH GFR
ALBUMIN: 4.2 g/dL (ref 3.6–5.1)
ALT: 14 U/L (ref 6–29)
AST: 20 U/L (ref 10–35)
Alkaline Phosphatase: 74 U/L (ref 33–130)
BILIRUBIN TOTAL: 0.4 mg/dL (ref 0.2–1.2)
BUN: 15 mg/dL (ref 7–25)
CALCIUM: 9.6 mg/dL (ref 8.6–10.4)
CO2: 26 mmol/L (ref 20–31)
CREATININE: 0.83 mg/dL (ref 0.60–0.93)
Chloride: 99 mmol/L (ref 98–110)
GFR, EST AFRICAN AMERICAN: 80 mL/min (ref >=60)
GFR, Est Non African American: 69 mL/min (ref >=60)
Glucose, Bld: 99 mg/dL (ref 65–99)
Potassium: 4.4 mmol/L (ref 3.5–5.3)
Sodium: 136 mmol/L (ref 135–146)
TOTAL PROTEIN: 7.5 g/dL (ref 6.1–8.1)

## 2017-04-02 LAB — DIFFERENTIAL
BASOS ABS: 0 {cells}/uL (ref 0–200)
Basophils Relative: 0 %
EOS PCT: 2 %
Eosinophils Absolute: 236 cells/uL (ref 15–500)
LYMPHS ABS: 3776 {cells}/uL (ref 850–3900)
Lymphocytes Relative: 32 %
MONOS PCT: 7 %
Monocytes Absolute: 826 cells/uL (ref 200–950)
NEUTROS PCT: 59 %
Neutro Abs: 6962 cells/uL (ref 1500–7800)

## 2017-04-02 LAB — HEMOGLOBIN A1C
HEMOGLOBIN A1C: 5.7 % — AB (ref <5.7)
MEAN PLASMA GLUCOSE: 117 mg/dL

## 2017-04-02 MED ORDER — LISINOPRIL 40 MG PO TABS: 40.0000 mg | ORAL_TABLET | Freq: Every day | ORAL | 1 refills | Status: DC

## 2017-04-02 NOTE — Progress Notes (Deleted)
We can't do that. That would be considered insurance fraud.

## 2017-04-03 ENCOUNTER — Encounter: Payer: Self-pay | Admitting: Physician Assistant

## 2017-04-03 ENCOUNTER — Other Ambulatory Visit: Payer: Self-pay | Admitting: Physician Assistant

## 2017-04-03 ENCOUNTER — Ambulatory Visit (HOSPITAL_BASED_OUTPATIENT_CLINIC_OR_DEPARTMENT_OTHER)
Admission: RE | Admit: 2017-04-03 | Discharge: 2017-04-03 | Disposition: A | Discharge status: Home / Self Care | Payer: Medicare Other | Source: Ambulatory Visit | Attending: Physician Assistant | Admitting: Physician Assistant

## 2017-04-03 DIAGNOSIS — D72829 Elevated white blood cell count, unspecified: Secondary | ICD-10-CM | POA: Insufficient documentation

## 2017-04-03 DIAGNOSIS — G252 Other specified forms of tremor: Secondary | ICD-10-CM | POA: Insufficient documentation

## 2017-04-03 DIAGNOSIS — R197 Diarrhea, unspecified: Secondary | ICD-10-CM

## 2017-04-03 DIAGNOSIS — R600 Localized edema: Secondary | ICD-10-CM

## 2017-04-03 DIAGNOSIS — R259 Unspecified abnormal involuntary movements: Secondary | ICD-10-CM

## 2017-04-03 DIAGNOSIS — I872 Venous insufficiency (chronic) (peripheral): Secondary | ICD-10-CM | POA: Insufficient documentation

## 2017-04-03 DIAGNOSIS — K580 Irritable bowel syndrome with diarrhea: Secondary | ICD-10-CM | POA: Insufficient documentation

## 2017-04-03 MED ORDER — AMBULATORY NON FORMULARY MEDICATION: 0 refills | Status: DC

## 2017-04-03 NOTE — Progress Notes (Signed)
Bilateral venous ultrasounds negative for DVT  Pedal edema likely dependent from osteoarthritis in the right lower extremity. Prescription sent for compression stockings.

## 2017-04-03 NOTE — Addendum Note (Signed)
Addended by: Nelson Chimes E on: 04/03/2017 04:35 PM   Modules accepted: Orders

## 2017-04-04 ENCOUNTER — Other Ambulatory Visit: Payer: Self-pay

## 2017-04-04 DIAGNOSIS — R197 Diarrhea, unspecified: Secondary | ICD-10-CM

## 2017-04-14 ENCOUNTER — Ambulatory Visit (INDEPENDENT_AMBULATORY_CARE_PROVIDER_SITE_OTHER): Payer: Medicare Other

## 2017-04-14 ENCOUNTER — Encounter: Payer: Self-pay | Admitting: Sports Medicine

## 2017-04-14 ENCOUNTER — Ambulatory Visit (INDEPENDENT_AMBULATORY_CARE_PROVIDER_SITE_OTHER): Payer: Medicare Other | Admitting: Sports Medicine

## 2017-04-14 DIAGNOSIS — M25551 Pain in right hip: Secondary | ICD-10-CM

## 2017-04-14 DIAGNOSIS — M4802 Spinal stenosis, cervical region: Secondary | ICD-10-CM | POA: Diagnosis not present

## 2017-04-14 DIAGNOSIS — M4722 Other spondylosis with radiculopathy, cervical region: Secondary | ICD-10-CM

## 2017-04-14 DIAGNOSIS — M47812 Spondylosis without myelopathy or radiculopathy, cervical region: Secondary | ICD-10-CM | POA: Insufficient documentation

## 2017-04-14 DIAGNOSIS — M50121 Cervical disc disorder at C4-C5 level with radiculopathy: Secondary | ICD-10-CM

## 2017-04-14 NOTE — Assessment & Plan Note (Signed)
Referable to the joint. We will give this another couple of weeks before considering injection, I am going to get baseline x-rays but her pain is likely a sprain.

## 2017-04-14 NOTE — Progress Notes (Signed)
   Subjective:    I'm seeing this patient as a consultation for:  Nelson Chimes, PA-C  CC: Hip and shoulder pain  HPI: Earlier today this 76 year old female twisted in bed, cotton-tipped covered and had severe pain in the right groin, she is able to bear weight without much difficulty, but pain is moderate, persistent without mechanical symptoms.  Shoulder pain: Localize bilaterally between the shoulder blades, nothing overtly radicular down the arms, moderate neck pain. She's failed greater than 6 weeks of physician directed rehabilitation and is agreeable to proceed with MRI.  Past medical history:  Negative.  See flowsheet/record as well for more information.  Surgical history: Negative.  See flowsheet/record as well for more information.  Family history: Negative.  See flowsheet/record as well for more information.  Social history: Negative.  See flowsheet/record as well for more information.  Allergies, and medications have been entered into the medical record, reviewed, and no changes needed.   Review of Systems: No headache, visual changes, nausea, vomiting, diarrhea, constipation, dizziness, abdominal pain, skin rash, fevers, chills, night sweats, weight loss, swollen lymph nodes, body aches, joint swelling, muscle aches, chest pain, shortness of breath, mood changes, visual or auditory hallucinations.   Objective:   General: Well Developed, well nourished, and in no acute distress.  Neuro/Psych: Alert and oriented x3, extra-ocular muscles intact, able to move all 4 extremities, sensation grossly intact. Skin: Warm and dry, no rashes noted.  Respiratory: Not using accessory muscles, speaking in full sentences, trachea midline.  Cardiovascular: Pulses palpable, no extremity edema. Abdomen: Does not appear distended. Neck: Negative spurling's Full neck range of motion Grip strength and sensation normal in bilateral hands Strength good C4 to T1 distribution No sensory change  to C4 to T1 Reflexes normal Right Hip: ROM IR: 30 with reproduction of pain in the groin, ER: 60 Deg, Flexion: 120 Deg, Extension: 100 Deg, Abduction: 45 Deg, Adduction: 45 Deg Strength IR: 5/5, ER: 5/5, Flexion: 5/5, Extension: 5/5, Abduction: 5/5, Adduction: 5/5 Pelvic alignment unremarkable to inspection and palpation. Standing hip rotation and gait without trendelenburg / unsteadiness. Greater trochanter without tenderness to palpation. No tenderness over piriformis. No SI joint tenderness and normal minimal SI movement.  Impression and Recommendations:   This case required medical decision making of moderate complexity.  Right hip pain Referable to the joint. We will give this another couple of weeks before considering injection, I am going to get baseline x-rays but her pain is likely a sprain.  Cervical spondylosis With bilateral periscapular radicular pain. She's already failed greater than 6 weeks of physician directed rehabilitation, x-rays, MRI. Return to see me to go over MRI results.

## 2017-04-14 NOTE — Assessment & Plan Note (Signed)
With bilateral periscapular radicular pain. She's already failed greater than 6 weeks of physician directed rehabilitation, x-rays, MRI. Return to see me to go over MRI results.

## 2017-04-29 ENCOUNTER — Ambulatory Visit (INDEPENDENT_AMBULATORY_CARE_PROVIDER_SITE_OTHER): Payer: Medicare Other | Admitting: Sports Medicine

## 2017-04-29 ENCOUNTER — Ambulatory Visit (INDEPENDENT_AMBULATORY_CARE_PROVIDER_SITE_OTHER): Payer: Medicare Other

## 2017-04-29 ENCOUNTER — Encounter: Payer: Self-pay | Admitting: Sports Medicine

## 2017-04-29 DIAGNOSIS — M5416 Radiculopathy, lumbar region: Secondary | ICD-10-CM

## 2017-04-29 DIAGNOSIS — M25562 Pain in left knee: Secondary | ICD-10-CM

## 2017-04-29 DIAGNOSIS — M25551 Pain in right hip: Secondary | ICD-10-CM

## 2017-04-29 DIAGNOSIS — G8929 Other chronic pain: Secondary | ICD-10-CM

## 2017-04-29 DIAGNOSIS — M11261 Other chondrocalcinosis, right knee: Secondary | ICD-10-CM

## 2017-04-29 MED ORDER — COSAMIN ASU ADVANCED FORMULA PO CAPS: 2.0000 | ORAL_CAPSULE | Freq: Two times a day (BID) | ORAL | 11 refills | Status: AC

## 2017-04-29 MED ORDER — TURMERIC 500 MG PO TABS: 1.0000 | ORAL_TABLET | Freq: Two times a day (BID) | ORAL | 11 refills | Status: DC

## 2017-04-29 NOTE — Progress Notes (Signed)
   Subjective:    I'm seeing this patient as a consultation for:  Nelson Chimes, PA-C  CC:  Left leg pain  HPI: This is a 76 year old female with known left lumbar radiculopathy, she has been too scared to get an epidural, she reports cramping in the left lower leg, with walking with pain around the knee itself. Has never really had knee imaging or ABIs. No bowel or bladder dysfunction, saddle numbness, constitutional symptoms.  Right hip pain: Localized to the groin, has not had any physical therapy, has taken some Tylenol. Pain is moderate, persistent, she understands that because nothing has changed since the last visit, that our plan won't change much.  Past medical history:  Negative.  See flowsheet/record as well for more information.  Surgical history: Negative.  See flowsheet/record as well for more information.  Family history: Negative.  See flowsheet/record as well for more information.  Social history: Negative.  See flowsheet/record as well for more information.  Allergies, and medications have been entered into the medical record, reviewed, and no changes needed.   Review of Systems: No headache, visual changes, nausea, vomiting, diarrhea, constipation, dizziness, abdominal pain, skin rash, fevers, chills, night sweats, weight loss, swollen lymph nodes, body aches, joint swelling, muscle aches, chest pain, shortness of breath, mood changes, visual or auditory hallucinations.   Objective:   General: Well Developed, well nourished, and in no acute distress.  Neuro/Psych: Alert and oriented x3, extra-ocular muscles intact, able to move all 4 extremities, sensation grossly intact. Skin: Warm and dry, no rashes noted.  Respiratory: Not using accessory muscles, speaking in full sentences, trachea midline.  Cardiovascular: Pulses palpable, no extremity edema. Abdomen: Does not appear distended. Left Knee: Tender to palpation of the medial joint line, mild swelling ROM normal in  flexion and extension and lower leg rotation. Ligaments with solid consistent endpoints including ACL, PCL, LCL, MCL. Negative Mcmurray's and provocative meniscal tests. Non painful patellar compression. Patellar and quadriceps tendons unremarkable. Hamstring and quadriceps strength is normal.  Negative Homans sign bilaterally.  Impression and Recommendations:   This case required medical decision making of moderate complexity.  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 Has not done physical therapy, and to use Tylenol, she is going to make a physical therapy appointment today. She is also going to take glucosamine and turmeric, declines chondroitin.  Left lumbar radiculitis With cramping in the left leg, though this could be from her left L4-L5 foraminal stenosis certainly intermittent claudication is possibility as well as knee osteoarthritis. Her symptoms are always very vague. Knee x-rays, ABI, if all are unrevealing we will consider a knee injection before proceeding further with a left L4-L5 transforaminal epidural.

## 2017-04-29 NOTE — Assessment & Plan Note (Signed)
Pain likely represents hip joint osteoarthritis. Has not done physical therapy, and to use Tylenol, she is going to make a physical therapy appointment today. She is also going to take glucosamine and turmeric, declines chondroitin.

## 2017-04-29 NOTE — Assessment & Plan Note (Signed)
With cramping in the left leg, though this could be from her left L4-L5 foraminal stenosis certainly intermittent claudication is possibility as well as knee osteoarthritis. Her symptoms are always very vague. Knee x-rays, ABI, if all are unrevealing we will consider a knee injection before proceeding further with a left L4-L5 transforaminal epidural.

## 2017-05-01 ENCOUNTER — Telehealth: Payer: Self-pay

## 2017-05-01 ENCOUNTER — Encounter: Payer: Self-pay | Admitting: Physical Therapy

## 2017-05-01 ENCOUNTER — Ambulatory Visit (INDEPENDENT_AMBULATORY_CARE_PROVIDER_SITE_OTHER): Payer: Medicare Other | Admitting: Physical Therapy

## 2017-05-01 DIAGNOSIS — M6281 Muscle weakness (generalized): Secondary | ICD-10-CM

## 2017-05-01 DIAGNOSIS — M545 Low back pain, unspecified: Secondary | ICD-10-CM

## 2017-05-01 DIAGNOSIS — M25651 Stiffness of right hip, not elsewhere classified: Secondary | ICD-10-CM

## 2017-05-01 DIAGNOSIS — M62838 Other muscle spasm: Secondary | ICD-10-CM

## 2017-05-01 DIAGNOSIS — M25551 Pain in right hip: Secondary | ICD-10-CM

## 2017-05-01 NOTE — Therapy (Signed)
[place] [address] [place], [place], [address] Phone: [phone]   Fax:  [phone]  Physical Therapy Evaluation  Patient Details  Name: Dana Calderon MRN: [id number] Date of Birth: Dec 14, 1940 Referring Provider: Dr Dianah Field  Encounter Date: 05/01/2017      PT End of Session - 05/01/17 1536    Visit Number 1   Number of Visits 12   Date for PT Re-Evaluation 06/12/17   PT Start Time 1536   PT Stop Time 1635   PT Time Calculation (min) 59 min   Activity Tolerance Patient limited by pain      Past Medical History:  Diagnosis Date  . Anxiety   . Back pain, chronic   . Breast cancer ([place])    left  . GERD (gastroesophageal reflux disease)   . Hemorrhoid   . Hypertension   . IBS (irritable bowel syndrome)   . Migraine   . Vertigo     Past Surgical History:  Procedure Laterality Date  . ABDOMINAL HYSTERECTOMY    . BREAST SURGERY    . HERNIA REPAIR    . KNEE ARTHROSCOPY Left   . LUMBAR DISC SURGERY    . MASTECTOMY Left   . TONSILLECTOMY    . TUBAL LIGATION      There were no vitals filed for this visit.       Subjective Assessment - 05/01/17 1536    Subjective Pt reports that 3 weeks ago she went to get out of bed and the covers were wrapped around her legs and she pulled on this. She states that her pain is really in the Rt side of her back ( bra strap area to hip)  and hip.     Pertinent History lumbar disectomy years ago, breast CA Lt at 76 yo, Lt knee scope 2009, tremurs in her Lt hand - started about 3 yrs ago - MD's not sure what this if from    How long can you sit comfortably? 19'   How long can you walk comfortably? brought cane today just in case she needed it, doesn't usually use it.  Tolerates 10' now with slower pace.    Diagnostic tests MRI DDD in her spine and arthritis in her knees.    Patient Stated Goals get rid of rt hip issues, decrease her back pain, walk more    Currently  in Pain? Yes            [facility] PT Assessment - 05/01/17 0001      Assessment   Medical Diagnosis Rt hip pain, lt lumbar radiculitis   Referring Provider Dr Dianah Field   Onset Date/Surgical Date 04/10/17   Hand Dominance Right   Next MD Visit after therapy   Prior Therapy not for this     Precautions   Precautions None     Restrictions   Weight Bearing Restrictions No     Balance Screen   Has the patient fallen in the past 6 months No     [place] residence   [place] --  has elevator to get to her apartment     Prior Function   Level of Independence Independent   Vocation Retired   Leisure not much of anything, out to eat sometimes.      Observation/Other Assessments   Focus on Therapeutic Outcomes (FOTO)  63% limited      Functional Tests  Functional tests --  SLS 2 sec each side.      Posture/Postural Control   Posture/Postural Control Postural limitations   Postural Limitations Rounded Shoulders;Forward head;Increased thoracic kyphosis;Weight shift right     ROM / Strength   AROM / PROM / Strength AROM;Strength     AROM   AROM Assessment Site Lumbar;Hip   Right/Left Hip Right   Right Hip Flexion 120  KTC    Right Hip External Rotation  26   Right Hip Internal Rotation  -5   Lumbar Flexion 75% available   Lumbar Extension 50 available with pain in Rt SIJ area   Lumbar - Right Rotation 50% available   Lumbar - Left Rotation 50% available      Strength   Strength Assessment Site Hip;Knee;Ankle   Right/Left Hip Right;Left   Right Hip Flexion 3-/5  due to pain   Right Hip ABduction 4/5   Left Hip Flexion 5/5   Left Hip ABduction --  5-/5   Right/Left Knee Left;Right   Right Knee Flexion 4+/5   Right Knee Extension 4+/5   Left Knee Flexion 4/5   Left Knee Extension 4+/5   Right/Left Ankle --  WNL     Flexibility   Soft Tissue Assessment /Muscle Length yes   Hamstrings  supine SLR Rt 85, Lt 80   Quadriceps prone knee flex Rt 125, Lt 132      Palpation   Spinal mobility painful with CPA mobs lumbar and thoracic   Palpation comment tight and tender bilat gluts/piriformis, Rt quad, Rt lumbar and thoracic paraspinals.      Special Tests    Special Tests --  (-) lumbar special tests.             Objective measurements completed on examination: See above findings.          [place] Adult PT Treatment/Exercise - 05/01/17 0001      Exercises   Exercises Lumbar     Lumbar Exercises: Stretches   Single Knee to Chest Stretch 1 rep;30 seconds   Lower Trunk Rotation --  10 reps      Lumbar Exercises: Supine   Other Supine Lumbar Exercises 5 reps MEEKs postural correction ex.      Modalities   Modalities --  Pt declined modalities.      Moist Heat Therapy   Number Minutes Moist Heat --   Moist Heat Location --     Acupuncturist Location --   Printmaker Action --   Electrical Stimulation Parameters --   Printmaker Goals --                PT Education - 05/01/17 1623    Education provided Yes   Education Details HEP    Person(s) Educated Patient   Methods Demonstration;Explanation;Handout   Comprehension Returned demonstration;Need further instruction             PT Long Term Goals - 05/01/17 1549      PT LONG TERM GOAL #1   Title I with advanced HEP ( 06/12/17)    Time 6   Period Weeks   Status New     PT LONG TERM GOAL #2   Title improve FOTO =/< 52% limited, CK level ( 06/12/17)    Time 6   Period Weeks   Status New     PT LONG TERM GOAL #3   Title report =/> 75% reduction of Rt hip and  back pain with daily activities ( 06/12/17)    Time 6   Period Weeks   Status New     PT LONG TERM GOAL #4   Title walk for short distances in the community with upright posture and minimal to no pain ( 06/12/17)    Time 6   Period Weeks   Status New     PT LONG  TERM GOAL #5   Title increase ROM Rt hip to allow her bend forward and donn shoes without difficulty ( 06/12/17)    Time 6   Period Weeks   Status New                Plan - May 20, 2017 1625    Clinical Impression Statement 76 yo female presents for eval of Rt hip and Lt low back pain, she also has Rt sided mid back pain.  She has postural changes, decreased motion, and strength, multiple areas of tightness and trigger points.  She also has multiple medical issues that contribute to her pain.  She has weakness that is most likely related to pain.    Clinical Presentation Evolving   Clinical Decision Making Moderate   Rehab Potential Good   PT Frequency 2x / week   PT Duration 6 weeks   PT Treatment/Interventions Moist Heat;Traction;Ultrasound;Therapeutic exercise;Dry needling;Manual techniques;Neuromuscular re-education;Cryotherapy;Electrical Stimulation;Iontophoresis 4mg /ml Dexamethasone;Patient/family education;Passive range of motion   PT Next Visit Plan manual work  low back and buttocks, core and posture work, modalties PRN for pain   Consulted and Agree with Plan of Care Patient      Patient will benefit from skilled therapeutic intervention in order to improve the following deficits and impairments:  Abnormal gait, Decreased range of motion, Pain, Increased muscle spasms, Decreased strength, Postural dysfunction, Impaired flexibility  Visit Diagnosis: Acute bilateral low back pain without sciatica - Plan: PT plan of care cert/re-cert  Pain in right hip - Plan: PT plan of care cert/re-cert  Muscle weakness (generalized) - Plan: PT plan of care cert/re-cert  Stiffness of right hip, not elsewhere classified - Plan: PT plan of care cert/re-cert  Other muscle spasm - Plan: PT plan of care cert/re-cert      [facility] PT PB G-CODES - 05-20-17 1548    Functional Assessment Tool Used  FOTO and professional judgement   Functional Limitations Mobility: Walking and moving around    Mobility: Walking and Moving Around Current Status At least 60 percent but less than 80 percent impaired, limited or restricted   Mobility: Walking and Moving Around Goal Status [id number]) At least 40 percent but less than 60 percent impaired, limited or restricted       Problem List Patient Active Problem List   Diagnosis Date Noted  . Right hip pain 04/14/2017  . Cervical spondylosis 04/14/2017  . Edema of right lower extremity 04/03/2017  . Resting tremor 04/03/2017  . Irritable bowel syndrome with diarrhea 04/03/2017  . Pedal edema 04/03/2017  . Leukocytosis 04/03/2017  . 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 Left lumbar radiculitis 02/20/2017  . Subacute vaginitis 02/17/2017  . Intertrigo 01/06/2017  . Osteopenia 01/06/2017  . Palpitations 01/06/2017  . Abnormal ECG 01/06/2017  . Xerosis of skin 01/06/2017  . Onychomycosis 09/13/2016  . Confusion and disorientation 09/13/2016  . Right ankle pain 07/23/2016  . 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 Mouth lesion 06/30/2015  . Uncontrolled hypertension 05/30/2015  . Prediabetes 05/30/2015  . Seborrheic dermatitis 05/30/2015  Jeral Pinch PT  05/01/2017, 4:40 PM  [facility] [place], [place], [address] Phone: [phone]   Fax:  [phone]  Name: AEISHA MINARIK MRN: [id number] Date of Birth: [birth year]

## 2017-05-01 NOTE — Telephone Encounter (Signed)
Ok just ride out the pain.  Keep active.

## 2017-05-01 NOTE — Patient Instructions (Addendum)
Decompression Exercise: Basic - do on the bed and have a pillow under your head.  Do once a day.    Lie on back on firm surface, knees bent, feet flat, arms turned up, out to sides, backs of hands down. Time _3-5__ minutes.  Shoulder Press   Press both shoulders down. Hold _3-5__ seconds. Repeat _10__ times.   Head Press With Bettles chin SLIGHTLY toward chest, keep mouth closed. Feel weight on back of head. Increase weight by pressing head down. Hold 3-5___ seconds. Relax. Repeat _10__ times.  Leg Lengthener: Full   Straighten one leg. Pull toes AND forefoot toward knee, extend heel. Lengthen leg by pulling pelvis away from ribs. Hold _3-5__ seconds. Relax. Repeat 10 time. Re-bend knee. Do other leg. Each leg _10__ times.  Leg Press: Single   Straighten one leg down to floor. Bring toes AND forefoot toward knee, extend heel. Press leg down. DO NOT BEND KNEE. Hold _3-5__ seconds. Relax leg. Repeat exercise 10 time. Relax leg. Re-bend knee. Repeat with other leg. Each leg 10___ times.  Knee-to-Chest Stretch: Unilateral    With hand behind right knee, pull knee in to chest until a comfortable stretch is felt in lower back and buttocks. Keep back relaxed. Hold _20-30___ seconds. Repeat __1__ times per set. Do _1___ sets per session. Do __1__ sessions per day. Repeat on the other leg.   Lumbar Rotation (Non-Weight Bearing)    Feet on floor, slowly rock knees from side to side in small, pain-free range of motion. Allow lower back to rotate slightly. Repeat __10__ times per set. Do __1__ sets per session. Do __1__ sessions per day.

## 2017-05-01 NOTE — Telephone Encounter (Signed)
Pt left VM stating she took the glucosamine for 3 days and now has dry patches and red spots. Pt states she will not take this anymore and she is scared to take turmeric at this time so she will wait.

## 2017-05-07 ENCOUNTER — Telehealth: Payer: Self-pay

## 2017-05-07 ENCOUNTER — Encounter: Payer: Medicare Other | Admitting: Physical Therapy

## 2017-05-07 LAB — CLOSTRIDIUM DIFFICILE BY PCR: CDIFFPCR: NOT DETECTED

## 2017-05-07 NOTE — Telephone Encounter (Signed)
That's fine, if she calls back please just let her know there is nothing I can do for her if she is going to not tolerate any treatment.

## 2017-05-07 NOTE — Telephone Encounter (Signed)
PT is having trouble with her physical therapy. She left you a hand written note explaining the issues, I put it in your box.  Physical Therapy cancelled her appt's for this week due to the issues and asked her to talk to you.

## 2017-05-09 ENCOUNTER — Encounter: Payer: Medicare Other | Admitting: Physical Therapy

## 2017-05-09 ENCOUNTER — Telehealth: Payer: Self-pay

## 2017-05-09 NOTE — Telephone Encounter (Signed)
Pt left VM asking if she should do exercises since she's feeling better. Pt also reiterated that she doesn't like to start new medications and hasn't started the supplements. Please advise.

## 2017-05-09 NOTE — Telephone Encounter (Signed)
Yes, she may start some exercises, it would be best if she works with physical therapy, she can let me know if she would like me to place that referral, if insufficient relief we will consider injections.

## 2017-05-09 NOTE — Telephone Encounter (Signed)
Pt.notified

## 2017-05-10 LAB — STOOL CULTURE

## 2017-05-12 ENCOUNTER — Ambulatory Visit (INDEPENDENT_AMBULATORY_CARE_PROVIDER_SITE_OTHER): Payer: Medicare Other | Admitting: Physical Therapy

## 2017-05-12 ENCOUNTER — Encounter: Payer: Medicare Other | Admitting: Physical Therapy

## 2017-05-12 DIAGNOSIS — M545 Low back pain, unspecified: Secondary | ICD-10-CM

## 2017-05-12 DIAGNOSIS — M6281 Muscle weakness (generalized): Secondary | ICD-10-CM

## 2017-05-12 DIAGNOSIS — M25551 Pain in right hip: Secondary | ICD-10-CM | POA: Diagnosis not present

## 2017-05-12 DIAGNOSIS — M62838 Other muscle spasm: Secondary | ICD-10-CM | POA: Diagnosis not present

## 2017-05-12 DIAGNOSIS — M25651 Stiffness of right hip, not elsewhere classified: Secondary | ICD-10-CM

## 2017-05-12 NOTE — Therapy (Signed)
[place], [place], [address] Phone: [phone]   Fax:  [phone]  Physical Therapy Treatment  Patient Details  Name: Dana Calderon MRN: [id number] Date of Birth: 1941/07/14 Referring Provider: Dr Dana Calderon  Encounter Date: 05/12/2017      PT End of Session - 05/12/17 1510    Visit Number 2   Number of Visits 12   Date for PT Re-Evaluation 06/12/17   PT Start Time 1510   PT Stop Time 1557   PT Time Calculation (min) 47 min   Activity Tolerance Patient tolerated treatment well      Past Medical History:  Diagnosis Date  . Anxiety   . Back pain, chronic   . Breast cancer (Dana Calderon)    left  . GERD (gastroesophageal reflux disease)   . Hemorrhoid   . Hypertension   . IBS (irritable bowel syndrome)   . Migraine   . Vertigo     Past Surgical History:  Procedure Laterality Date  . ABDOMINAL HYSTERECTOMY    . BREAST SURGERY    . HERNIA REPAIR    . KNEE ARTHROSCOPY Left   . LUMBAR DISC SURGERY    . MASTECTOMY Left   . TONSILLECTOMY    . TUBAL LIGATION      There were no vitals filed for this visit.      Subjective Assessment - 05/12/17 1510    Subjective Pt reports she is feeling better this week than last, did a lot of resting and was able to sleep.  She hasn't started her exercise back yet.    Patient Stated Goals get rid of rt hip issues, decrease her back pain, walk more    Currently in Pain? Yes   Pain Score 3    Pain Location Back   Pain Orientation Right;Lower   Pain Descriptors / Indicators Aching   Pain Type Chronic pain   Pain Radiating Towards sometimes into Lt buttocks with sitting - not tonight   Pain Onset More than a month ago   Pain Frequency Intermittent   Aggravating Factors  mainly at night , lying in her bed.    Pain Relieving Factors resting her back supported on the couch.             [facility] PT Assessment - 05/12/17 0001      Assessment   Medical  Diagnosis Rt hip pain, lt lumbar radiculitis     AROM   Right Hip Flexion 105   Right Hip External Rotation  40                     [facility] Adult PT Treatment/Exercise - 05/12/17 0001      Self-Care   Self-Care Other Self-Care Comments   Other Self-Care Comments  reviewed HEP,recommend she use a thicker pillow for head presses, push gently with leg press and lengthener, decrease frequency to every other day.      Lumbar Exercises: Aerobic   Stationary Bike nustep L4x6'     Lumbar Exercises: Standing   Other Standing Lumbar Exercises scapular retraction leaning against noodle, then with yellow band.      Lumbar Exercises: Supine   Clam 10 reps  with TA engaged, bilat, single-yellow band   Bridge 10 reps     Manual Therapy   Manual Therapy Soft tissue mobilization   Manual therapy comments pt supine   Soft tissue mobilization STM to Rt lumbar paraspinals, upper gluts and  around Rt SIJ, pt very tender initially then loosened up.                      PT Long Term Goals - 05/12/17 1537      PT LONG TERM GOAL #1   Title I with advanced HEP ( 06/12/17)    Status On-going     PT LONG TERM GOAL #2   Title improve FOTO =/< 52% limited, CK level ( 06/12/17)    Status On-going     PT LONG TERM GOAL #3   Title report =/> 75% reduction of Rt hip and back pain with daily activities ( 06/12/17)    Status On-going     PT LONG TERM GOAL #4   Title walk for short distances in the community with upright posture and minimal to no pain ( 06/12/17)    Status On-going     PT LONG TERM GOAL #5   Title increase ROM Rt hip to allow her bend forward and donn shoes without difficulty ( 06/12/17)    Status On-going               Plan - 05/12/17 1600    Clinical Impression Statement This is Dana Calderon's second visit, no goals met.  She took last week off as she had increased pain and some abdominal issues.  We have modified her HEP to ease her into the exercise.  She  responed well to todays treatment however is a little worried about how she will feel tonight.    Rehab Potential Good   PT Frequency 2x / week   PT Treatment/Interventions Moist Heat;Traction;Ultrasound;Therapeutic exercise;Dry needling;Manual techniques;Neuromuscular re-education;Cryotherapy;Electrical Stimulation;Iontophoresis '4mg'$ /ml Dexamethasone;Patient/family education;Passive range of motion   PT Next Visit Plan assess tolerance to ex every other day.    Consulted and Agree with Plan of Care Patient      Patient will benefit from skilled therapeutic intervention in order to improve the following deficits and impairments:  Abnormal gait, Decreased range of motion, Pain, Increased muscle spasms, Decreased strength, Postural dysfunction, Impaired flexibility  Visit Diagnosis: Acute bilateral low back pain without sciatica  Pain in right hip  Muscle weakness (generalized)  Stiffness of right hip, not elsewhere classified  Other muscle spasm     Problem List Patient Active Problem List   Diagnosis Date Noted  . Right hip pain 04/14/2017  . Cervical spondylosis 04/14/2017  . Edema of right lower extremity 04/03/2017  . Resting tremor 04/03/2017  . Irritable bowel syndrome with diarrhea 04/03/2017  . Pedal edema 04/03/2017  . Leukocytosis 04/03/2017  . 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 Left lumbar radiculitis 02/20/2017  . Subacute vaginitis 02/17/2017  . Intertrigo 01/06/2017  . Osteopenia 01/06/2017  . Palpitations 01/06/2017  . Abnormal ECG 01/06/2017  . Xerosis of skin 01/06/2017  . Onychomycosis 09/13/2016  . Confusion and disorientation 09/13/2016  . Right ankle pain 07/23/2016  . 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 Mouth lesion 06/30/2015  . Uncontrolled hypertension 05/30/2015  . Prediabetes 05/30/2015  . Seborrheic dermatitis 05/30/2015    Dana Calderon Pinch PT  05/12/2017, 4:02 PM  [facility] [place], [place], [address] Phone: [phone]   Fax:  [phone]  Name: Dana Calderon MRN: [id number] Date of Birth: 06-12-1941

## 2017-05-12 NOTE — Patient Instructions (Signed)
Start performing HEP every other day.

## 2017-05-13 ENCOUNTER — Other Ambulatory Visit: Payer: Self-pay | Admitting: Physician Assistant

## 2017-05-13 NOTE — Progress Notes (Signed)
Good news, stool studies were negative for C. Diff or bacteria. Diarrhea is not likely infectious. She should continue daily probiotics (yogurt with Live & Active cultures seal or Culturelle supplement) and symptomatic management with Lomotil or Imodium.

## 2017-05-15 ENCOUNTER — Ambulatory Visit (INDEPENDENT_AMBULATORY_CARE_PROVIDER_SITE_OTHER): Payer: Medicare Other | Admitting: Physical Therapy

## 2017-05-15 ENCOUNTER — Encounter: Payer: Self-pay | Admitting: Physical Therapy

## 2017-05-15 DIAGNOSIS — M545 Low back pain, unspecified: Secondary | ICD-10-CM

## 2017-05-15 DIAGNOSIS — M6281 Muscle weakness (generalized): Secondary | ICD-10-CM | POA: Diagnosis not present

## 2017-05-15 DIAGNOSIS — M25551 Pain in right hip: Secondary | ICD-10-CM | POA: Diagnosis not present

## 2017-05-15 DIAGNOSIS — M25651 Stiffness of right hip, not elsewhere classified: Secondary | ICD-10-CM | POA: Diagnosis not present

## 2017-05-15 NOTE — Therapy (Signed)
[place], [place], [address] Phone: [phone]   Fax:  [phone]  Physical Therapy Treatment  Patient Details  Name: Dana Calderon MRN: [id number] Date of Birth: 10-04-1941 Referring Provider: Dr Dianah Field  Encounter Date: 05/15/2017      PT End of Session - 05/15/17 1452    Visit Number 3   Number of Visits 12   Date for PT Re-Evaluation 06/12/17   PT Start Time 1452   PT Stop Time 1525   PT Time Calculation (min) 33 min   Activity Tolerance Patient limited by pain;Patient limited by fatigue  pt also had increased tremors in Lt UE per pt.       Past Medical History:  Diagnosis Date  . Anxiety   . Back pain, chronic   . Breast cancer ([place])    left  . GERD (gastroesophageal reflux disease)   . Hemorrhoid   . Hypertension   . IBS (irritable bowel syndrome)   . Migraine   . Vertigo     Past Surgical History:  Procedure Laterality Date  . ABDOMINAL HYSTERECTOMY    . BREAST SURGERY    . HERNIA REPAIR    . KNEE ARTHROSCOPY Left   . LUMBAR DISC SURGERY    . MASTECTOMY Left   . TONSILLECTOMY    . TUBAL LIGATION      There were no vitals filed for this visit.      Subjective Assessment - 05/15/17 1455    Subjective Pt reports she isn't feeling great, had a yeast infection that is very itchy and her Rt foot/heel are incredibly sensitive if she touches it on anything soft.  She has not called her MD.  She is so distracted by her foot and yeast infection that she hasn't really though about her back.    Patient Stated Goals get rid of rt hip issues, decrease her back pain, walk more    Currently in Pain? Yes                         [facility] Adult PT Treatment/Exercise - 05/15/17 0001      Lumbar Exercises: Stretches   Piriformis Stretch Limitations butterfly stretch for inner thigh.      Lumbar Exercises: Aerobic   Stationary Bike nustep L4x6'     Lumbar  Exercises: Supine   Ab Set 10 reps;5 seconds   Bridge 10 reps   Straight Leg Raise 5 reps  3 sets with TA contractions   Other Supine Lumbar Exercises 5 reps UE pull downs with yellow band   Other Supine Lumbar Exercises thoracic lifts x 10 minutes.      Lumbar Exercises: Sidelying   Clam 10 reps  each side with ball between feet                     PT Long Term Goals - 05/12/17 1537      PT LONG TERM GOAL #1   Title I with advanced HEP ( 06/12/17)    Status On-going     PT LONG TERM GOAL #2   Title improve FOTO =/< 52% limited, CK level ( 06/12/17)    Status On-going     PT LONG TERM GOAL #3   Title report =/> 75% reduction of Rt hip and back pain with daily activities ( 06/12/17)    Status On-going     PT LONG TERM GOAL #  4   Title walk for short distances in the community with upright posture and minimal to no pain ( 06/12/17)    Status On-going     PT LONG TERM GOAL #5   Title increase ROM Rt hip to allow her bend forward and donn shoes without difficulty ( 06/12/17)    Status On-going               Plan - 05/15/17 1518    Clinical Impression Statement Dana Calderon is having multiple medical issues that area distracting her from her back and hip issues.  She is tolerating the change to performing her HEP everyother day.    Rehab Potential Good   PT Frequency 2x / week   PT Duration 6 weeks   PT Treatment/Interventions Moist Heat;Traction;Ultrasound;Therapeutic exercise;Dry needling;Manual techniques;Neuromuscular re-education;Cryotherapy;Electrical Stimulation;Iontophoresis 4mg /ml Dexamethasone;Patient/family education;Passive range of motion   PT Next Visit Plan progress HEP    Consulted and Agree with Plan of Care Patient      Patient will benefit from skilled therapeutic intervention in order to improve the following deficits and impairments:  Abnormal gait, Decreased range of motion, Pain, Increased muscle spasms, Decreased strength, Postural  dysfunction, Impaired flexibility  Visit Diagnosis: Acute bilateral low back pain without sciatica  Pain in right hip  Muscle weakness (generalized)  Stiffness of right hip, not elsewhere classified     Problem List Patient Active Problem List   Diagnosis Date Noted  . Right hip pain 04/14/2017  . Cervical spondylosis 04/14/2017  . Edema of right lower extremity 04/03/2017  . Resting tremor 04/03/2017  . Irritable bowel syndrome with diarrhea 04/03/2017  . Pedal edema 04/03/2017  . Leukocytosis 04/03/2017  . 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 Left lumbar radiculitis 02/20/2017  . Subacute vaginitis 02/17/2017  . Intertrigo 01/06/2017  . Osteopenia 01/06/2017  . Palpitations 01/06/2017  . Abnormal ECG 01/06/2017  . Xerosis of skin 01/06/2017  . Onychomycosis 09/13/2016  . Confusion and disorientation 09/13/2016  . Right ankle pain 07/23/2016  . 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 Mouth lesion 06/30/2015  . Uncontrolled hypertension 05/30/2015  . Prediabetes 05/30/2015  . Seborrheic dermatitis 05/30/2015    Jeral Pinch PT 05/15/2017, 3:26 PM  [facility] [place], [place], [address] Phone: [phone]   Fax:  [phone]  Name: Dana Calderon MRN: [id number] Date of Birth: [birth year]

## 2017-05-20 ENCOUNTER — Ambulatory Visit (INDEPENDENT_AMBULATORY_CARE_PROVIDER_SITE_OTHER): Payer: Medicare Other | Admitting: Physical Therapy

## 2017-05-20 ENCOUNTER — Encounter: Payer: Self-pay | Admitting: Physical Therapy

## 2017-05-20 DIAGNOSIS — M62838 Other muscle spasm: Secondary | ICD-10-CM | POA: Diagnosis not present

## 2017-05-20 DIAGNOSIS — M25651 Stiffness of right hip, not elsewhere classified: Secondary | ICD-10-CM

## 2017-05-20 DIAGNOSIS — R29898 Other symptoms and signs involving the musculoskeletal system: Secondary | ICD-10-CM

## 2017-05-20 DIAGNOSIS — M545 Low back pain, unspecified: Secondary | ICD-10-CM

## 2017-05-20 DIAGNOSIS — M25571 Pain in right ankle and joints of right foot: Secondary | ICD-10-CM

## 2017-05-20 DIAGNOSIS — M25551 Pain in right hip: Secondary | ICD-10-CM

## 2017-05-20 DIAGNOSIS — M6281 Muscle weakness (generalized): Secondary | ICD-10-CM

## 2017-05-20 NOTE — Therapy (Signed)
[place] Calder[place], [place], [address] Phone: [phone]   Fax:  [phone]  Physical Therapy Treatment  Patient Details  Name: Dana Calderon MRN: [id number] Date of Birth: December 28, 1940 Referring Provider: Dr Darene Lamer  Encounter Date: 05/20/2017      PT End of Session - 05/20/17 1008    Visit Number 4   Number of Visits 12   Date for PT Re-Evaluation 06/12/17   PT Start Time 1010  stopped early, not feeling well   PT Stop Time 1042   PT Time Calculation (min) 32 min   Activity Tolerance Other (comment)  not feeling well.       Past Medical History:  Diagnosis Date  . Anxiety   . Back pain, chronic   . Breast cancer ([place])    left  . GERD (gastroesophageal reflux disease)   . Hemorrhoid   . Hypertension   . IBS (irritable bowel syndrome)   . Migraine   . Vertigo     Past Surgical History:  Procedure Laterality Date  . ABDOMINAL HYSTERECTOMY    . BREAST SURGERY    . HERNIA REPAIR    . KNEE ARTHROSCOPY Left   . LUMBAR DISC SURGERY    . MASTECTOMY Left   . TONSILLECTOMY    . TUBAL LIGATION      There were no vitals filed for this visit.      Subjective Assessment - 05/20/17 1011    Subjective Dana Calderon reports her Rt foot is a little better, she had a very busy day on Sunday with her son and thinks she may have over done it.    Pertinent History lumbar disectomy years ago, breast CA Lt at 76 yo, Lt knee scope 2009, tremurs in her Lt hand - started about 3 yrs ago - MD's not sure what this if from    Patient Stated Goals get rid of rt hip issues, decrease her back pain, walk more    Currently in Pain? Yes   Pain Score 3   she feels that her back is a little bit better.    Pain Location Back   Pain Orientation Right;Lower   Pain Descriptors / Indicators Dull   Pain Type Chronic pain   Pain Onset More than a month ago   Pain Frequency Intermittent   Aggravating Factors  Rt hip bothered her some  last night with lying down. Sitting on folding chair   Pain Relieving Factors resting            [facility] PT Assessment - 05/20/17 0001      Assessment   Medical Diagnosis Rt hip pain, lt lumbar radiculitis   Referring Provider Dr T   Onset Date/Surgical Date 04/10/17     AROM   Right Hip Flexion 110                     [facility] Adult PT Treatment/Exercise - 05/20/17 0001      Lumbar Exercises: Stretches   Single Knee to Chest Stretch 2 reps;30 seconds     Lumbar Exercises: Aerobic   Stationary Bike nustep L5x6'     Lumbar Exercises: Standing   Other Standing Lumbar Exercises scapular retraction, red band x 20 reps, VC for TA contraction and standing up tall     Lumbar Exercises: Seated   Sit to Stand Limitations sitting on blue disc 20 reps UE ER with red band, then 20 reps LAQ  Lumbar Exercises: Supine   Bridge 20 reps                     PT Long Term Goals - 05/20/17 1032      PT LONG TERM GOAL #1   Title I with advanced HEP ( 06/12/17)    Status On-going     PT LONG TERM GOAL #2   Title improve FOTO =/< 52% limited, CK level ( 06/12/17)    Status On-going     PT LONG TERM GOAL #3   Title report =/> 75% reduction of Rt hip and back pain with daily activities ( 06/12/17)      PT LONG TERM GOAL #4   Title walk for short distances in the community with upright posture and minimal to no pain ( 06/12/17)    Status On-going     PT LONG TERM GOAL #5   Title increase ROM Rt hip to allow her bend forward and donn shoes without difficulty ( 06/12/17)    Status On-going               Plan - 05/20/17 1040    Clinical Impression Statement Dana Calderon reports she is getting in/out of bed easier with her rolling. She wasn't feeling to well today from her IBS and her neck sore from sleeping funny.  Her Rt foot is doing better, less pain, it has moved to the outside of her leg now. Her back pain is improving slowly   Rehab Potential Good   PT  Frequency 2x / week   PT Duration 6 weeks   PT Treatment/Interventions Moist Heat;Traction;Ultrasound;Therapeutic exercise;Dry needling;Manual techniques;Neuromuscular re-education;Cryotherapy;Electrical Stimulation;Iontophoresis 4mg /ml Dexamethasone;Patient/family education;Passive range of motion   PT Next Visit Plan progress HEP       Patient will benefit from skilled therapeutic intervention in order to improve the following deficits and impairments:  Abnormal gait, Decreased range of motion, Pain, Increased muscle spasms, Decreased strength, Postural dysfunction, Impaired flexibility  Visit Diagnosis: Acute bilateral low back pain without sciatica  Pain in right hip  Muscle weakness (generalized)  Stiffness of right hip, not elsewhere classified  Other muscle spasm  Pain in right ankle and joints of right foot  Other symptoms and signs involving the musculoskeletal system     Problem List Patient Active Problem List   Diagnosis Date Noted  . Right hip pain 04/14/2017  . Cervical spondylosis 04/14/2017  . Edema of right lower extremity 04/03/2017  . Resting tremor 04/03/2017  . Irritable bowel syndrome with diarrhea 04/03/2017  . Pedal edema 04/03/2017  . Leukocytosis 04/03/2017  . 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 Left lumbar radiculitis 02/20/2017  . Subacute vaginitis 02/17/2017  . Intertrigo 01/06/2017  . Osteopenia 01/06/2017  . Palpitations 01/06/2017  . Abnormal ECG 01/06/2017  . Xerosis of skin 01/06/2017  . Onychomycosis 09/13/2016  . Confusion and disorientation 09/13/2016  . Right ankle pain 07/23/2016  . 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 Mouth lesion 06/30/2015  . Uncontrolled hypertension 05/30/2015  . Prediabetes 05/30/2015  . Seborrheic dermatitis 05/30/2015    Dana Calderon PT  05/20/2017, 10:46 AM  [facility] [place]  [place], [place], [address] Phone: [phone]   Fax:  [phone]  Name: Dana Calderon MRN: [id number] Date of Birth: [birth year]

## 2017-05-23 ENCOUNTER — Encounter: Payer: Self-pay | Admitting: Sports Medicine

## 2017-05-23 ENCOUNTER — Ambulatory Visit (INDEPENDENT_AMBULATORY_CARE_PROVIDER_SITE_OTHER): Payer: Medicare Other | Admitting: Physical Therapy

## 2017-05-23 ENCOUNTER — Ambulatory Visit (INDEPENDENT_AMBULATORY_CARE_PROVIDER_SITE_OTHER): Payer: Medicare Other | Admitting: Sports Medicine

## 2017-05-23 DIAGNOSIS — M25651 Stiffness of right hip, not elsewhere classified: Secondary | ICD-10-CM | POA: Diagnosis not present

## 2017-05-23 DIAGNOSIS — M25571 Pain in right ankle and joints of right foot: Secondary | ICD-10-CM

## 2017-05-23 DIAGNOSIS — G8929 Other chronic pain: Secondary | ICD-10-CM

## 2017-05-23 DIAGNOSIS — M545 Low back pain, unspecified: Secondary | ICD-10-CM

## 2017-05-23 DIAGNOSIS — M25551 Pain in right hip: Secondary | ICD-10-CM | POA: Diagnosis not present

## 2017-05-23 DIAGNOSIS — M62838 Other muscle spasm: Secondary | ICD-10-CM

## 2017-05-23 DIAGNOSIS — M6281 Muscle weakness (generalized): Secondary | ICD-10-CM

## 2017-05-23 DIAGNOSIS — R29898 Other symptoms and signs involving the musculoskeletal system: Secondary | ICD-10-CM | POA: Diagnosis not present

## 2017-05-23 MED ORDER — AMBULATORY NON FORMULARY MEDICATION: 0 refills | Status: DC

## 2017-05-23 NOTE — Progress Notes (Signed)
   Subjective:    I'm seeing this patient as a consultation for:  Dana Chimes, PA-C  CC: Right ankle pain  HPI: This is a pleasant 76 year old female, for over a year now she's had pain that she localizes on the right ankle, anteriorly and posteriorly, worse with weightbearing, moderate, persistent without radiation, she had an x-ray about a year ago that was surprisingly unremarkable. She is resistant to most medications, and conservative rehabilitation-type exercises have not been effective.  Hip pain: Resolved with therapy.  Past medical history:  Negative.  See flowsheet/record as well for more information.  Surgical history: Negative.  See flowsheet/record as well for more information.  Family history: Negative.  See flowsheet/record as well for more information.  Social history: Negative.  See flowsheet/record as well for more information.  Allergies, and medications have been entered into the medical record, reviewed, and no changes needed.   Review of Systems: No headache, visual changes, nausea, vomiting, diarrhea, constipation, dizziness, abdominal pain, skin rash, fevers, chills, night sweats, weight loss, swollen lymph nodes, body aches, joint swelling, muscle aches, chest pain, shortness of breath, mood changes, visual or auditory hallucinations.   Objective:   General: Well Developed, well nourished, and in no acute distress.  Neuro/Psych: Alert and oriented x3, extra-ocular muscles intact, able to move all 4 extremities, sensation grossly intact. Skin: Warm and dry, no rashes noted.  Respiratory: Not using accessory muscles, speaking in full sentences, trachea midline.  Cardiovascular: Pulses palpable, no extremity edema. Abdomen: Does not appear distended. Right Ankle: Visible swollen with tenderness over the talar dome and a palpable effusion from what feels to be the tibiotalar joint. Range of motion is full in all directions. Strength is 5/5 in all  directions. Stable lateral and medial ligaments; squeeze test and kleiger test unremarkable; Talar dome nontender; No pain at base of 5th MT; No tenderness over cuboid; No tenderness over N spot or navicular prominence No tenderness on posterior aspects of lateral and medial malleolus No sign of peroneal tendon subluxations; Negative tarsal tunnel tinel's Able to walk 4 steps.  Ankle joint was strapped with compressive dressing.  Impression and Recommendations:   This case required medical decision making of moderate complexity.  Right ankle pain She does still have significant swelling, pain associated with the tibiotalar joint. X-rays were surprisingly unremarkable, I have strapped to the ankle with compressive dressing, she will ice it for 20 minutes 3-4 times a day she is resistant to most medications. I'm going to obtain an MRI and she'll return to see me afterwards.

## 2017-05-23 NOTE — Assessment & Plan Note (Signed)
She does still have significant swelling, pain associated with the tibiotalar joint. X-rays were surprisingly unremarkable, I have strapped to the ankle with compressive dressing, she will ice it for 20 minutes 3-4 times a day she is resistant to most medications. I'm going to obtain an MRI and she'll return to see me afterwards.

## 2017-05-23 NOTE — Patient Instructions (Addendum)
Bridging    Slowly raise buttocks from floor, keeping stomach tight. Repeat _20___ times per set. Do __1__ sets per session. Do __1__ sessions per day.  Extremity Flexion (Hook-Lying) hold one pound weights    Tighten stomach and slowly lower right arm over head keep back flat, trunk rigid. Repeat _20___ times per set. Do _1___ sets per session. Do __1__ sessions per day.   Unsupported Anterior / Posterior Weight Shift: Lower Trunk Leading    Sit, feet flat on floor, hands clasped in front. Lean forward through hips, nose over knees. Return. Lean backward through hips. Hold each position __2-3__ seconds. Repeat _10-20___ times per session. Do __1__ sessions per day. Repeat on compliant surface: _on a pillow_______.  Unsupported Lateral Weight Shift: Lower Trunk Leading    Sit with feet flat on floor, hands clasped together in front. Lift opposite hip to bring body weight over right buttock. Keep head upright. Hold _2-3___ seconds. Repeat __10__ times per session. Do __1__ sessions per day. Repeat to opposite side. Repeat on compliant surface:___on a pillow_____.  Copyright  VHI. All rights reserved.

## 2017-05-23 NOTE — Therapy (Addendum)
[place], [place], [address] Phone: [phone]   Fax:  [phone]  Physical Therapy Treatment  Patient Details  Name: Dana Calderon MRN: [id number] Date of Birth: 06/12/1941 Referring Provider: Dr Darene Lamer  Encounter Date: 05/23/2017      PT End of Session - 05/23/17 1409    Visit Number 5   Number of Visits 12   PT Start Time 3888   PT Stop Time 2800  heat at end   PT Time Calculation (min) 45 min   Activity Tolerance Patient limited by fatigue      Past Medical History:  Diagnosis Date  . Anxiety   . Back pain, chronic   . Breast cancer ([place])    left  . GERD (gastroesophageal reflux disease)   . Hemorrhoid   . Hypertension   . IBS (irritable bowel syndrome)   . Migraine   . Vertigo     Past Surgical History:  Procedure Laterality Date  . ABDOMINAL HYSTERECTOMY    . BREAST SURGERY    . HERNIA REPAIR    . KNEE ARTHROSCOPY Left   . LUMBAR DISC SURGERY    . MASTECTOMY Left   . TONSILLECTOMY    . TUBAL LIGATION      There were no vitals filed for this visit.      Subjective Assessment - 05/23/17 1408    Subjective Pt reports she slept better last night only woke up twice instead of every hour.    Patient Stated Goals get rid of rt hip issues, decrease her back pain, walk more    Currently in Pain? No/denies  only having ankle pain.                          [facility] Adult PT Treatment/Exercise - 05/23/17 0001      Lumbar Exercises: Aerobic   Stationary Bike nustep L5x6'     Lumbar Exercises: Standing   Wall Slides --  attempted however pt was very anxious so we stopped.      Lumbar Exercises: Seated   Sit to Stand Limitations sitting on blue disc, marching, LAQ , deep breaths with arms reaching up and slight BWD lean     Lumbar Exercises: Supine   Other Supine Lumbar Exercises 20 reps abdominal bracing in hooklying, holding 1# with overhead reaching        Heat to neck in seated position x 15'             PT Long Term Goals - 05/20/17 1032      PT LONG TERM GOAL #1   Title I with advanced HEP ( 06/12/17)    Status On-going     PT LONG TERM GOAL #2   Title improve FOTO =/< 52% limited, CK level ( 06/12/17)    Status On-going     PT LONG TERM GOAL #3   Title report =/> 75% reduction of Rt hip and back pain with daily activities ( 06/12/17)      PT LONG TERM GOAL #4   Title walk for short distances in the community with upright posture and minimal to no pain ( 06/12/17)    Status On-going     PT LONG TERM GOAL #5   Title increase ROM Rt hip to allow her bend forward and donn shoes without difficulty ( 06/12/17)    Status On-going  Plan - 05/23/17 1441    Clinical Impression Statement Calynn is having decreased pain in her back and hip, able to sleep better.  She is very concerned about the pain she is having in her foot and is seeing the MD today to have it assessed. She did develop some  neck pain with exercise and used heat at the of treatment with good relief. HEP was progressed, she fatigued with this.    Rehab Potential Good   PT Frequency 2x / week   PT Duration 6 weeks   PT Treatment/Interventions Moist Heat;Traction;Ultrasound;Therapeutic exercise;Dry needling;Manual techniques;Neuromuscular re-education;Cryotherapy;Electrical Stimulation;Iontophoresis 51m/ml Dexamethasone;Patient/family education;Passive range of motion   PT Next Visit Plan review new HEP    Consulted and Agree with Plan of Care Patient      Patient will benefit from skilled therapeutic intervention in order to improve the following deficits and impairments:  Abnormal gait, Decreased range of motion, Pain, Increased muscle spasms, Decreased strength, Postural dysfunction, Impaired flexibility  Visit Diagnosis: Acute bilateral low back pain without sciatica  Pain in right hip  Muscle weakness (generalized)  Stiffness of  right hip, not elsewhere classified  Other muscle spasm  Pain in right ankle and joints of right foot  Other symptoms and signs involving the musculoskeletal system     Problem List Patient Active Problem List   Diagnosis Date Noted  . Right hip pain 04/14/2017  . Cervical spondylosis 04/14/2017  . Edema of right lower extremity 04/03/2017  . Resting tremor 04/03/2017  . Irritable bowel syndrome with diarrhea 04/03/2017  . Pedal edema 04/03/2017  . Leukocytosis 04/03/2017  . 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 Left lumbar radiculitis 02/20/2017  . Subacute vaginitis 02/17/2017  . Intertrigo 01/06/2017  . Osteopenia 01/06/2017  . Palpitations 01/06/2017  . Abnormal ECG 01/06/2017  . Xerosis of skin 01/06/2017  . Onychomycosis 09/13/2016  . Confusion and disorientation 09/13/2016  . Right ankle pain 07/23/2016  . 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 Mouth lesion 06/30/2015  . Uncontrolled hypertension 05/30/2015  . Prediabetes 05/30/2015  . Seborrheic dermatitis 05/30/2015    SJeral PinchPT 05/23/2017, 2:44 PM  [facility] [address]Phone: [phone]  Fax:  [phone] Name: FGRACY EHLYMRN: [id number] of Birth: 9January 11, 1942  PHYSICAL THERAPY DISCHARGE SUMMARY  Visits from Start of Care: 5 Current functional level related to goals / functional outcomes: unknown   Remaining deficits: unknown   Education / Equipment: HEP Plan:                                                    Patient goals were not met. Patient is being discharged due to a change in medical status.  ?????Pt diagnosed with stress fx and was immobilized in a cast.  Will return for therapy PRN    SJeral Pinch PT 07/07/17 10:19 AM

## 2017-05-29 ENCOUNTER — Ambulatory Visit (INDEPENDENT_AMBULATORY_CARE_PROVIDER_SITE_OTHER): Payer: Medicare Other | Admitting: Sports Medicine

## 2017-05-29 ENCOUNTER — Ambulatory Visit (INDEPENDENT_AMBULATORY_CARE_PROVIDER_SITE_OTHER): Payer: Medicare Other

## 2017-05-29 DIAGNOSIS — M21611 Bunion of right foot: Secondary | ICD-10-CM | POA: Diagnosis not present

## 2017-05-29 DIAGNOSIS — M79674 Pain in right toe(s): Secondary | ICD-10-CM

## 2017-05-29 DIAGNOSIS — M2011 Hallux valgus (acquired), right foot: Secondary | ICD-10-CM

## 2017-05-29 MED ORDER — ACETAMINOPHEN ER 650 MG PO TBCR: 650.0000 mg | EXTENDED_RELEASE_TABLET | Freq: Three times a day (TID) | ORAL | 3 refills | Status: DC | PRN

## 2017-05-29 NOTE — Assessment & Plan Note (Signed)
Pain at the interphalangeal joint of the right great toe, likely degenerative. Right foot x-rays, postop shoe, arthritis strength Tylenol, she will ice it for 20 minutes 3-4 times a day.

## 2017-05-29 NOTE — Progress Notes (Signed)
   Subjective:    I'm seeing this patient as a consultation for:  Nelson Chimes, PA-C  CC: Right great toe pain  HPI: Dana Calderon is back again, she has had pain in her right great toe for one day. No trauma, no other symptoms. She does have her MRI scheduled. Moderate, persistent, localized without radiation.  Past medical history:  Negative.  See flowsheet/record as well for more information.  Surgical history: Negative.  See flowsheet/record as well for more information.  Family history: Negative.  See flowsheet/record as well for more information.  Social history: Negative.  See flowsheet/record as well for more information.  Allergies, and medications have been entered into the medical record, reviewed, and no changes needed.   Review of Systems: No headache, visual changes, nausea, vomiting, diarrhea, constipation, dizziness, abdominal pain, skin rash, fevers, chills, night sweats, weight loss, swollen lymph nodes, body aches, joint swelling, muscle aches, chest pain, shortness of breath, mood changes, visual or auditory hallucinations.   Objective:   General: Well Developed, well nourished, and in no acute distress.  Neuro/Psych: Alert and oriented x3, extra-ocular muscles intact, able to move all 4 extremities, sensation grossly intact. Skin: Warm and dry, no rashes noted.  Respiratory: Not using accessory muscles, speaking in full sentences, trachea midline.  Cardiovascular: Pulses palpable, no extremity edema. Abdomen: Does not appear distended. Right Foot: No visible erythema or swelling. Range of motion is full in all directions. Strength is 5/5 in all directions. No pes cavus or pes planus. No abnormal callus noted. No pain over the navicular prominence, or base of fifth metatarsal. No tenderness to palpation of the calcaneal insertion of plantar fascia. No pain at the Achilles insertion. No pain over the calcaneal bursa. No pain of the retrocalcaneal bursa. Bilateral  hallux valgus, only minimal tenderness at the interphalangeal joint. No hallux rigidus or limitus. No tenderness palpation over interphalangeal joints. No pain with compression of the metatarsal heads. Neurovascularly intact distally.  Impression and Recommendations:   This case required medical decision making of moderate complexity.  Pain of great toe, right Pain at the interphalangeal joint of the right great toe, likely degenerative. Right foot x-rays, postop shoe, arthritis strength Tylenol, she will ice it for 20 minutes 3-4 times a day.

## 2017-05-29 NOTE — Progress Notes (Signed)
Pt stated that she has been having sharp shooting pain in the right big toe.

## 2017-06-02 ENCOUNTER — Encounter: Payer: Self-pay | Admitting: Physician Assistant

## 2017-06-02 ENCOUNTER — Ambulatory Visit (INDEPENDENT_AMBULATORY_CARE_PROVIDER_SITE_OTHER): Payer: Medicare Other

## 2017-06-02 ENCOUNTER — Ambulatory Visit (INDEPENDENT_AMBULATORY_CARE_PROVIDER_SITE_OTHER): Payer: Medicare Other | Admitting: Physician Assistant

## 2017-06-02 VITALS — BP 151/82 | HR 74 | Temp 98.1°F | Wt 192.0 lb

## 2017-06-02 DIAGNOSIS — N7689 Other specified inflammation of vagina and vulva: Secondary | ICD-10-CM | POA: Diagnosis not present

## 2017-06-02 DIAGNOSIS — M25871 Other specified joint disorders, right ankle and foot: Secondary | ICD-10-CM | POA: Diagnosis not present

## 2017-06-02 DIAGNOSIS — L309 Dermatitis, unspecified: Secondary | ICD-10-CM

## 2017-06-02 MED ORDER — CLOBETASOL PROPIONATE 0.05 % EX CREA: 1.0000 "application " | TOPICAL_CREAM | Freq: Two times a day (BID) | CUTANEOUS | 0 refills | Status: AC

## 2017-06-02 NOTE — Patient Instructions (Addendum)
-   Apply Clobetasol cream to your vulva twice a day - Avoid all soaps. Purchase Cetaphil skin cleanser - Use fingertips for washing (no washcloths) - Pat dry; don't rub - Use unscented laundry detergents - Continue cold compresses daily / sit in a cold bath without soap for 5 minutes in the morning and at night - Wear cotton underwear or no underwear and loose fitting clothing - Follow-up if no improvement in 1 week. I would like you to see Gynecology at that point

## 2017-06-02 NOTE — Progress Notes (Signed)
HPI:                                                                Dana Calderon is a 76 y.o. female who presents to [facility] today for vulvovaginal discomfort/rash  Patient presents today for recurrent vulvovaginal discomfort. She reports redness and swelling of her labia and perineum. It is more painful than pruritic. This has been going on for months. She has a history of HSV 2, but states these symptoms are different and denies recent outbreak. She has not been sexually active in 2 years. Last wet prep and STI screening was negative.   Past Medical History:  Diagnosis Date  . Anxiety   . Back pain, chronic   . Breast cancer ([place])    left  . GERD (gastroesophageal reflux disease)   . Hemorrhoid   . Hypertension   . IBS (irritable bowel syndrome)   . Migraine   . Vertigo    Past Surgical History:  Procedure Laterality Date  . ABDOMINAL HYSTERECTOMY    . BREAST SURGERY    . HERNIA REPAIR    . KNEE ARTHROSCOPY Left   . LUMBAR DISC SURGERY    . MASTECTOMY Left   . TONSILLECTOMY    . TUBAL LIGATION     Social History  Substance Use Topics  . Smoking status: Never Smoker  . Smokeless tobacco: Never Used  . Alcohol use No   family history includes Hypertension in her mother; Stroke in her father.  ROS: negative except as noted in the HPI  Medications: Current Outpatient Prescriptions  Medication Sig Dispense Refill  . acetaminophen (TYLENOL) 650 MG CR tablet Take 1 tablet (650 mg total) by mouth every 8 (eight) hours as needed for pain. 90 tablet 3  . ALPRAZolam (XANAX) 0.25 MG tablet TAKE ONE TABLET BY MOUTH AT BEDTIME AS NEEDED FOR ANXIETY 30 tablet 0  . AMBULATORY NON FORMULARY MEDICATION Knee-high, medium compression, graduated compression stockings. Apply to lower extremities. [email or web address], Zippered Compression Stockings, medium circ, long length 1 each 0  . AMBULATORY NON FORMULARY MEDICATION Rolling  4-point walker 1 each 0  . amLODipine (NORVASC) 5 MG tablet Take 5 mg by mouth daily.    Marland Kitchen BYSTOLIC 20 MG TABS TAKE TWO TABLETS BY MOUTH EVERY DAY 60 tablet 3  . diphenoxylate-atropine (LOMOTIL) 2.5-0.025 MG tablet Take 1 tablet by mouth daily as needed for diarrhea or loose stools. 30 tablet 2  . lisinopril (PRINIVIL,ZESTRIL) 40 MG tablet Take 1 tablet (40 mg total) by mouth daily. 90 tablet 1  . loratadine (CLARITIN) 10 MG tablet Take 1 tablet (10 mg total) by mouth daily. 10 tablet 0  . meclizine (ANTIVERT) 25 MG tablet Take 1 tablet (25 mg total) by mouth 3 (three) times daily as needed for dizziness. 30 tablet 0  . Misc Natural Products (COSAMIN ASU ADVANCED FORMULA) CAPS Take 2 tablets by mouth 2 (two) times daily. 120 capsule 11  . Multiple Minerals-Vitamins (CALCIUM-MAGNESIUM-ZINC-D3 PO) Take 1 tablet by mouth 2 (two) times daily.    . Multiple Vitamin (MULTIVITAMIN) tablet Take 1 tablet by mouth daily.    Marland Kitchen omeprazole (PRILOSEC) 20 MG capsule TAKE ONE CAPSULE BY MOUTH EVERY DAY 90 capsule 0  .  OVER THE COUNTER MEDICATION Take 1 capsule by mouth daily. "Mega Red"    . Polyethylene Glycol 400 (BLINK TEARS OP) Place 1 drop into both eyes as needed (for dry eyes).    . sodium chloride (OCEAN) 0.65 % SOLN nasal spray Place 1 spray into both nostrils as needed for congestion. 15 mL 0  . Turmeric 500 MG TABS Take 1 tablet by mouth 2 (two) times daily. 60 tablet 11  . valACYclovir (VALTREX) 500 MG tablet TAKE ONE TABLET BY MOUTH 2 TIMES A DAY. MAY REFILL IF NEW OUTBREAK OCCURS. 6 tablet 2   No current facility-administered medications for this visit.    Allergies  Allergen Reactions  . Cefuroxime Axetil Shortness Of Breath    Has had cephalexin (Keflex) without side effects  . Edarbi [Azilsartan] Other (See Comments)    Heart pounding, foot swelling  . Methylprednisolone Other (See Comments) and Hypertension    Raised blood pressure and sugar  . Nystop [Nystatin] Other (See Comments)     Burning skin and breathing problems  . Penicillins Other (See Comments)    Face broke out in lumps  . Red Dye Itching, Rash and Other (See Comments)    Fainting  . Tamiflu [Oseltamivir Phosphate] Diarrhea and Other (See Comments)    Fainting  . Verelan [Verapamil Hcl Er] Shortness Of Breath  . Zithromax [Azithromycin] Shortness Of Breath  . Carbidopa-Levodopa Itching and Other (See Comments)    Itchy ears, throat and tongue   . Doxazosin Mesylate Other (See Comments)    Fast pulse  . Hyoscyamine Other (See Comments)    Shakes, dizzy and headache  . Influenza Vaccines Other (See Comments)    "head in a vice", nasal itching  . Other Other (See Comments)    Cardiolyte injection - burning through body  . Asa [Aspirin] Rash  . Atenolol Other (See Comments)    Unknown  . Avelox [Moxifloxacin] Other (See Comments)    Unknown  . Benicar [Olmesartan] Other (See Comments)    Unknown  . Bentyl [Dicyclomine Hcl] Other (See Comments)    Headache and high BP  . Benzonatate Other (See Comments)    Unknown  . Bisoprolol Other (See Comments)    Unknown  . Cardizem [Diltiazem Hcl] Other (See Comments)    Unknown  . Carvedilol Other (See Comments)    Unknown  . Cefdinir Other (See Comments)    Unknown  . Chlorthalidone Other (See Comments)    Unknown  . Ciprofloxacin Other (See Comments)    Heartburn  . Citalopram Other (See Comments)    Unknown  . Clonidine Derivatives Other (See Comments)    Unknown  . Diovan [Valsartan] Other (See Comments)    Unknown  . Doxycycline Other (See Comments)    Headache  . Erythromycin Other (See Comments)    Unknown  . Fioricet [Butalbital-Apap-Caffeine] Other (See Comments)    Headache and dizzy  . Flexeril [Cyclobenzaprine] Other (See Comments)    Unknown  . Furosemide Other (See Comments)    Unknown  . Hydralazine Other (See Comments)    Headache  . Hydroxyzine Other (See Comments)    nightmares  . Isosorbide Other (See Comments)     Unknown  . Lyrica [Pregabalin] Other (See Comments)    Unknown  . Macrobid [employer] Macro] Other (See Comments)    Unknown  . Mavik [Trandolapril] Other (See Comments)    Unknown  . Metronidazole Itching and Other (See Comments)    Dizzy  .  Naproxen Other (See Comments)    Unknown  . Neo-Polycin Hc [Bacitra-Neomycin-Polymyxin-Hc] Itching  . Nexium [Esomeprazole Magnesium] Other (See Comments)    Headache  . Norflex [Orphenadrine Citrate] Other (See Comments)  . Oxybutynin Chloride Er Other (See Comments)    Unknown  . Paxil [Paroxetine Hcl] Other (See Comments)    Unknown  . Phenazopyridine Other (See Comments)    Unknown  . Prazosin Other (See Comments)    Unknown  . Prednisone Other (See Comments)    Unknown  . Promethazine Other (See Comments)    Unknown  . Prozac [Fluoxetine Hcl] Other (See Comments)    Unknown  . Spironolactone Other (See Comments)    Unknown  . Sulfur Other (See Comments)    Unknown  . Tizanidine Other (See Comments)    Unknown  . Valium [Diazepam] Other (See Comments)    "Too strong?"  . Zoloft [Sertraline Hcl] Other (See Comments)    Unknown       Objective:  BP (!) 151/82   Pulse 74   Temp 98.1 F (36.7 C) (Oral)   Wt 192 lb (87.1 kg)   BMI 30.99 kg/m  Gen: well-groomed, cooperative, not ill-appearing, no distress HEENT: normal conjunctiva, wearing glasses Pulm: Normal work of breathing, normal phonation CV: Normal rate, regular rhythm, s1 and s2 distinct, no murmurs, clicks or rubs  GU: vulva with erythema and mild edema of both labia majora and minora, no lesions, normal introitus and urethral meatus Neuro: alert and oriented x 3, EOM's intact, resting tremor MSK: moving all extremities, normal gait and station  A chaperone was used for the GU portion of the exam Emeterio Reeve, DO)   No results found for this or any previous visit (from the past 72 hour(s)).     Assessment and Plan: 76 y.o. female  with   1. Vulvar dermatitis - will trial topical Clobetasol for 2 weeks. I would like patient to follow-up with gynecology for possible biopsy. Discussed referral to GYN if no improvement with topical steroid. Patient is amenable to this plan - counseled on hygiene measures - cold compresses daily - clobetasol cream (TEMOVATE) 0.05 %; Apply 1 application topically 2 (two) times daily.  Dispense: 15 g; Refill: 0  Patient education and anticipatory guidance given Patient agrees with treatment plan Follow-up as needed if symptoms worsen or fail to improve  Darlyne Russian PA-C

## 2017-06-05 ENCOUNTER — Telehealth: Payer: Self-pay

## 2017-06-05 ENCOUNTER — Ambulatory Visit (INDEPENDENT_AMBULATORY_CARE_PROVIDER_SITE_OTHER): Payer: Medicare Other | Admitting: Sports Medicine

## 2017-06-05 ENCOUNTER — Encounter: Payer: Self-pay | Admitting: Sports Medicine

## 2017-06-05 DIAGNOSIS — G8929 Other chronic pain: Secondary | ICD-10-CM | POA: Diagnosis not present

## 2017-06-05 DIAGNOSIS — L309 Dermatitis, unspecified: Secondary | ICD-10-CM

## 2017-06-05 DIAGNOSIS — M25571 Pain in right ankle and joints of right foot: Secondary | ICD-10-CM

## 2017-06-05 NOTE — Progress Notes (Signed)
  Subjective:    CC: Follow-up  HPI: Right ankle pain: MRI did in the showing a talar stress injury, short-leg cast was recommended.   Past medical history:  Negative.  See flowsheet/record as well for more information.  Surgical history: Negative.  See flowsheet/record as well for more information.  Family history: Negative.  See flowsheet/record as well for more information.  Social history: Negative.  See flowsheet/record as well for more information.  Allergies, and medications have been entered into the medical record, reviewed, and no changes needed.   Review of Systems: No fevers, chills, night sweats, weight loss, chest pain, or shortness of breath.   Objective:    General: Well Developed, well nourished, and in no acute distress.  Neuro: Alert and oriented x3, extra-ocular muscles intact, sensation grossly intact.  HEENT: Normocephalic, atraumatic, pupils equal round reactive to light, neck supple, no masses, no lymphadenopathy, thyroid nonpalpable.  Skin: Warm and dry, no rashes. Cardiac: Regular rate and rhythm, no murmurs rubs or gallops, no lower extremity edema.  Respiratory: Clear to auscultation bilaterally. Not using accessory muscles, speaking in full sentences. Right Ankle: Swollen, painful. Range of motion is full in all directions. Strength is 5/5 in all directions. Stable lateral and medial ligaments; squeeze test and kleiger test unremarkable; Talar dome nontender; No pain at base of 5th MT; No tenderness over cuboid; No tenderness over N spot or navicular prominence No tenderness on posterior aspects of lateral and medial malleolus No sign of peroneal tendon subluxations; Negative tarsal tunnel tinel's Able to walk 4 steps.  Short-leg cast placed with a walker attached on the bottom  Impression and Recommendations:    Right ankle pain Talar stress injury on MRI, short-leg walking cast. Return in one month.  I spent 15 minutes with this patient,  greater than 50% was face-to-face time counseling regarding the above diagnoses, this was separate from the time spent performing the above procedure

## 2017-06-05 NOTE — Assessment & Plan Note (Signed)
Talar stress injury on MRI, short-leg walking cast. Return in one month.

## 2017-06-05 NOTE — Telephone Encounter (Signed)
Referral placed to Gynecology for ongoing chronic vulvar erythema and discomfort that has failed conservative treatment, including topical corticosteroids.

## 2017-06-05 NOTE — Telephone Encounter (Signed)
Patient called requested a referral to GYN. Please advise. [name],CMA

## 2017-06-06 ENCOUNTER — Encounter: Payer: Medicare Other | Admitting: Physical Therapy

## 2017-06-07 ENCOUNTER — Other Ambulatory Visit: Payer: Self-pay | Admitting: Physician Assistant

## 2017-06-08 DIAGNOSIS — L309 Dermatitis, unspecified: Secondary | ICD-10-CM | POA: Insufficient documentation

## 2017-06-09 ENCOUNTER — Other Ambulatory Visit: Payer: Self-pay | Admitting: Physician Assistant

## 2017-06-16 ENCOUNTER — Encounter (HOSPITAL_COMMUNITY): Payer: Medicare Other

## 2017-07-03 ENCOUNTER — Encounter: Payer: Self-pay | Admitting: Sports Medicine

## 2017-07-03 ENCOUNTER — Ambulatory Visit (INDEPENDENT_AMBULATORY_CARE_PROVIDER_SITE_OTHER): Payer: Medicare Other | Admitting: Sports Medicine

## 2017-07-03 DIAGNOSIS — M25571 Pain in right ankle and joints of right foot: Secondary | ICD-10-CM

## 2017-07-03 DIAGNOSIS — G8929 Other chronic pain: Secondary | ICD-10-CM | POA: Diagnosis not present

## 2017-07-03 NOTE — Progress Notes (Signed)
  Subjective:    CC: Follow-up  HPI: This is a pleasant 76 year old female, she returns after one month of cast immobilization, she had some midfoot pain, MRI ended up showing a talar stress fracture. She is overall pain-free now.  Past medical history:  Negative.  See flowsheet/record as well for more information.  Surgical history: Negative.  See flowsheet/record as well for more information.  Family history: Negative.  See flowsheet/record as well for more information.  Social history: Negative.  See flowsheet/record as well for more information.  Allergies, and medications have been entered into the medical record, reviewed, and no changes needed.   Review of Systems: No fevers, chills, night sweats, weight loss, chest pain, or shortness of breath.   Objective:    General: Well Developed, well nourished, and in no acute distress.  Neuro: Alert and oriented x3, extra-ocular muscles intact, sensation grossly intact.  HEENT: Normocephalic, atraumatic, pupils equal round reactive to light, neck supple, no masses, no lymphadenopathy, thyroid nonpalpable.  Skin: Warm and dry, no rashes. Cardiac: Regular rate and rhythm, no murmurs rubs or gallops, no lower extremity edema.  Respiratory: Clear to auscultation bilaterally. Not using accessory muscles, speaking in full sentences. Right Ankle: Cast is removed. No visible erythema or swelling. Range of motion is full in all directions. Strength is 5/5 in all directions. Stable lateral and medial ligaments; squeeze test and kleiger test unremarkable; Talar dome nontender; No pain at base of 5th MT; No tenderness over cuboid; No tenderness over N spot or navicular prominence No tenderness on posterior aspects of lateral and medial malleolus No sign of peroneal tendon subluxations; Negative tarsal tunnel tinel's Able to walk 4 steps.  Impression and Recommendations:    Right ankle pain MRI confirmed talar stress fracture. Cast removed  after one month. Doing well, return in one month.  I spent 25 minutes with this patient, greater than 50% was face-to-face time counseling regarding the above diagnoses

## 2017-07-03 NOTE — Assessment & Plan Note (Signed)
MRI confirmed talar stress fracture. Cast removed after one month. Doing well, return in one month.

## 2017-07-10 ENCOUNTER — Other Ambulatory Visit: Payer: Self-pay | Admitting: Physician Assistant

## 2017-07-16 ENCOUNTER — Other Ambulatory Visit: Payer: Self-pay | Admitting: Physician Assistant

## 2017-07-16 DIAGNOSIS — K580 Irritable bowel syndrome with diarrhea: Secondary | ICD-10-CM

## 2017-07-18 ENCOUNTER — Telehealth: Payer: Self-pay | Admitting: *Deleted

## 2017-07-18 NOTE — Telephone Encounter (Signed)
Patient had left a VM last night asking that we check up on her as she lives alone and has a headache. I called for a follow up and did not get an answer. Left a VM to call back as needed.

## 2017-07-31 ENCOUNTER — Ambulatory Visit (INDEPENDENT_AMBULATORY_CARE_PROVIDER_SITE_OTHER): Payer: Medicare Other | Admitting: Sports Medicine

## 2017-07-31 ENCOUNTER — Encounter: Payer: Self-pay | Admitting: Sports Medicine

## 2017-07-31 DIAGNOSIS — M7989 Other specified soft tissue disorders: Secondary | ICD-10-CM

## 2017-07-31 MED ORDER — MAGNESIUM OXIDE 400 MG PO TABS: 800.0000 mg | ORAL_TABLET | Freq: Every day | ORAL | 3 refills | Status: DC

## 2017-07-31 NOTE — Assessment & Plan Note (Addendum)
MRI did show a talar stress fracture that did for a well after a month of cast immobilization. Overall she doesn't have any pain right now but just chronic swelling of the right lower extremity. Compression hose has not been sufficiently efficacious. I'm going to have her get a different type of compression hose that is somewhat tighter. I also like to do a venous reflux ultrasound. She is having some cramping at night, and going to add some magnesium at bedtime.

## 2017-07-31 NOTE — Progress Notes (Signed)
  Subjective:    CC: Follow-up  HPI: Right talar stress fracture: Pain resolved after cast immobilization with forced immobilization for a month.  Right leg swelling: Persistent, chronic, really not painful but simply unsightly and annoying. Negative DVT ultrasounds in the past   Past medical history:  Negative.  See flowsheet/record as well for more information.  Surgical history: Negative.  See flowsheet/record as well for more information.  Family history: Negative.  See flowsheet/record as well for more information.  Social history: Negative.  See flowsheet/record as well for more information.  Allergies, and medications have been entered into the medical record, reviewed, and no changes needed.   Review of Systems: No fevers, chills, night sweats, weight loss, chest pain, or shortness of breath.   Objective:    General: Well Developed, well nourished, and in no acute distress.  Neuro: Alert and oriented x3, extra-ocular muscles intact, sensation grossly intact.  HEENT: Normocephalic, atraumatic, pupils equal round reactive to light, neck supple, no masses, no lymphadenopathy, thyroid nonpalpable.  Skin: Warm and dry, no rashes. Cardiac: Regular rate and rhythm, no murmurs rubs or gallops, no lower extremity edema.  Respiratory: Clear to auscultation bilaterally. Not using accessory muscles, speaking in full sentences. Lower extremity: Right side is swollen with 2+ pitting edema. Good pulses, good sensation. No tenderness over the talus.   Impression and Recommendations:    Right leg swelling MRI did show a talar stress fracture that did for a well after a month of cast immobilization. Overall she doesn't have any pain right now but just chronic swelling of the right lower extremity. Compression hose has not been sufficiently efficacious. I'm going to have her get a different type of compression hose that is somewhat tighter. I also like to do a venous reflux ultrasound. She is  having some cramping at night, and going to add some magnesium at bedtime.    I spent 25 minutes with this patient, greater than 50% was face-to-face time counseling regarding the above diagnoses

## 2017-08-08 ENCOUNTER — Other Ambulatory Visit: Payer: Self-pay | Admitting: Physician Assistant

## 2017-08-08 DIAGNOSIS — B009 Herpesviral infection, unspecified: Secondary | ICD-10-CM

## 2017-08-13 ENCOUNTER — Other Ambulatory Visit: Payer: Self-pay | Admitting: Physician Assistant

## 2017-08-13 ENCOUNTER — Encounter: Payer: Self-pay | Admitting: Physician Assistant

## 2017-08-13 DIAGNOSIS — F411 Generalized anxiety disorder: Secondary | ICD-10-CM

## 2017-08-13 DIAGNOSIS — Z79899 Other long term (current) drug therapy: Secondary | ICD-10-CM

## 2017-08-13 HISTORY — DX: Other long term (current) drug therapy: Z79.899

## 2017-08-18 ENCOUNTER — Encounter: Payer: Self-pay | Admitting: Obstetrics & Gynecology

## 2017-08-18 ENCOUNTER — Ambulatory Visit (INDEPENDENT_AMBULATORY_CARE_PROVIDER_SITE_OTHER): Payer: Medicare Other | Admitting: Obstetrics & Gynecology

## 2017-08-18 VITALS — BP 143/70 | HR 68 | Resp 16 | Ht 66.0 in | Wt 187.0 lb

## 2017-08-18 DIAGNOSIS — B373 Candidiasis of vulva and vagina: Secondary | ICD-10-CM | POA: Diagnosis not present

## 2017-08-18 DIAGNOSIS — B3731 Acute candidiasis of vulva and vagina: Secondary | ICD-10-CM

## 2017-08-18 DIAGNOSIS — R21 Rash and other nonspecific skin eruption: Secondary | ICD-10-CM | POA: Diagnosis not present

## 2017-08-18 MED ORDER — FLUCONAZOLE 150 MG PO TABS: 150.0000 mg | ORAL_TABLET | Freq: Once | ORAL | 3 refills | Status: AC

## 2017-08-18 NOTE — Progress Notes (Signed)
   Subjective:    Patient ID: Dana Calderon, female    DOB: December 18, 1940, 76 y.o.   MRN: [id number]  HPI 76 yo DW lady here with the issue of a 6 month h/o itching vulva. She was seen by primary care and prescribed temovate but she has not tried it yet. She has been wearing Depends for about 6 months due to general wetness (urine). It is not a specific case of GSUI or urge incontinence.   She is abstinent for about 3 years. She has a diagnosis of HSV2 but this current issue does not feel like HSV to her.  Review of Systems     Objective:   Physical Exam Breathing, conversing, and ambulating normally Obese, well hydrated white female, no apparent distress General tremor of left hand Vulva with excoriation of the outside of the labia majora, bilateral symmetrical, decreased pigmentation of the rest of the labia majora Redness, random of the labia minora/vestibule Obvious urine on vulva Rash near anus c/w yeast     Assessment & Plan:  Yeast- diflucan trial General excoriation- probably due to urine but will give a 1 month trial of daily temovate If not better in 1 months, then biopsy, rule out Paget's

## 2017-08-19 ENCOUNTER — Telehealth: Payer: Self-pay

## 2017-08-19 NOTE — Telephone Encounter (Signed)
Transferred to scheduling for appointment.

## 2017-08-19 NOTE — Telephone Encounter (Signed)
Pt is having leg cramps from left thigh down to ankle.  Took magnesium, and 1/2 of a xanax, and diflucan for a yeast infection, and she was up all night with cramps,  She said that her leg is so sore this morning from her knee to ankle, it is hard to stand on leg.  Tried warm compress.  Please advise.

## 2017-08-19 NOTE — Telephone Encounter (Signed)
Probably need to take a look.  No idea what it could be based on the description.

## 2017-08-21 ENCOUNTER — Ambulatory Visit (INDEPENDENT_AMBULATORY_CARE_PROVIDER_SITE_OTHER): Payer: Medicare Other | Admitting: Sports Medicine

## 2017-08-21 DIAGNOSIS — S76312A Strain of muscle, fascia and tendon of the posterior muscle group at thigh level, left thigh, initial encounter: Secondary | ICD-10-CM | POA: Diagnosis not present

## 2017-08-21 NOTE — Assessment & Plan Note (Signed)
Strapped thigh with compressive dressing, hamstring rehabilitation exercises given, I expect full resolution in a week or 2. If insufficient resolution we will look further into her lumbar spine for a radicular cause.

## 2017-08-21 NOTE — Progress Notes (Signed)
   Subjective:    I'm seeing this patient as a consultation for:  Dana Chimes, PA-C  CC: Left thigh pain  HPI: 2 days ago this pleasant 76 year old female was trying to get into her car, she turned her hip and felt a pain of back of her left thigh. No bruising, no swelling, just comfortable pain.  She is also having some difficulty with understanding written language, word finding, and forgetfulness. She also has significant parkinsonian type symptoms, I asked her multiple times in the past to proceed with an appointment with her neurologist, she agrees to do this.  Past medical history, Surgical history, Family history not pertinant except as noted below, Social history, Allergies, and medications have been entered into the medical record, reviewed, and no changes needed.   Review of Systems: No headache, visual changes, nausea, vomiting, diarrhea, constipation, dizziness, abdominal pain, skin rash, fevers, chills, night sweats, weight loss, swollen lymph nodes, body aches, joint swelling, muscle aches, chest pain, shortness of breath, mood changes, visual or auditory hallucinations.   Objective:   General: Well Developed, well nourished, and in no acute distress.  Neuro:  Extra-ocular muscles intact, able to move all 4 extremities, sensation grossly intact.  Deep tendon reflexes tested were normal. Psych: Alert and oriented, mood congruent with affect. ENT:  Ears and nose appear unremarkable.  Hearing grossly normal. Neck: Unremarkable overall appearance, trachea midline.  No visible thyroid enlargement. Eyes: Conjunctivae and lids appear unremarkable.  Pupils equal and round. Skin: Warm and dry, no rashes noted.  Cardiovascular: Pulses palpable, no extremity edema. Left thigh: Tender to palpation along the muscle belly of the semi-member gnosis and semitendinosus, reproduction of pain with resisted flexion of the knee. No palpable gaps, no visible bruising.  Thigh and upper knee  were strapped with compressive dressing.  Impression and Recommendations:   This case required medical decision making of moderate complexity.  Strain of left hamstring Strapped thigh with compressive dressing, hamstring rehabilitation exercises given, I expect full resolution in a week or 2. If insufficient resolution we will look further into her lumbar spine for a radicular cause.  ___________________________________________ Gwen Her. Dianah Field, M.D., ABFM., CAQSM. Primary Care and [place] Instructor of [place] of [facility] [employer]

## 2017-08-27 ENCOUNTER — Encounter: Payer: Self-pay | Admitting: *Deleted

## 2017-08-27 ENCOUNTER — Emergency Department (INDEPENDENT_AMBULATORY_CARE_PROVIDER_SITE_OTHER)
Admission: EM | Admit: 2017-08-27 | Discharge: 2017-08-27 | Disposition: A | Discharge status: Home / Self Care | Payer: Medicare Other | Source: Home / Self Care | Attending: Family Medicine | Admitting: Family Medicine

## 2017-08-27 ENCOUNTER — Other Ambulatory Visit: Payer: Self-pay

## 2017-08-27 DIAGNOSIS — M7989 Other specified soft tissue disorders: Secondary | ICD-10-CM

## 2017-08-27 DIAGNOSIS — M5412 Radiculopathy, cervical region: Secondary | ICD-10-CM

## 2017-08-27 NOTE — ED Provider Notes (Signed)
[name]    CSN: [id number] Arrival date & time: 08/27/17  1626     History   Chief Complaint Chief Complaint  Patient presents with  . Neck Pain    HPI Dana Calderon is a 76 y.o. female.   Patient complains of pain in her left neck for two days and occipital headache.  She also has pain that radiates into her right shoulder area.  Her pain is worse when she tilts her head to the right laterally.  She also complains of mild sinus congestion and post-nasal drainage during the past several days.  No cough.  No fevers, chills, and sweats.  No recent injury.  She does recall vacuuming her floor prior to onset of present symptoms. She has a history of degenerative cervical spine disease, with abnormal MRI neck done 04/14/17.   The history is provided by the patient.    Past Medical History:  Diagnosis Date  . Anxiety   . Back pain, chronic   . Breast cancer ([place])    left  . 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 GERD (gastroesophageal reflux disease)   . Hemorrhoid   . HSV infection   . Hypertension   . IBS (irritable bowel syndrome)   . Migraine   . Vertigo     Patient Active Problem List   Diagnosis Date Noted  . Strain of left hamstring 08/21/2017  . 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 Vulvar dermatitis 06/08/2017  . Pain of great toe, right 05/29/2017  . Right hip pain 04/14/2017  . Cervical spondylosis 04/14/2017  . Edema of right lower extremity 04/03/2017  . Resting tremor 04/03/2017  . Irritable bowel syndrome with diarrhea 04/03/2017  . Pedal edema 04/03/2017  . Leukocytosis 04/03/2017  . 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 Left lumbar radiculitis 02/20/2017  . Subacute vaginitis 02/17/2017  . Intertrigo 01/06/2017  . Osteopenia 01/06/2017  . Palpitations 01/06/2017  . Abnormal ECG 01/06/2017  . Xerosis of skin 01/06/2017  . Onychomycosis 09/13/2016  . Confusion and disorientation 09/13/2016  .  Right leg swelling 07/23/2016  . 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 Mouth lesion 06/30/2015  . Uncontrolled hypertension 05/30/2015  . Prediabetes 05/30/2015  . Seborrheic dermatitis 05/30/2015    Past Surgical History:  Procedure Laterality Date  . ABDOMINAL HYSTERECTOMY    . BREAST SURGERY    . HERNIA REPAIR    . KNEE ARTHROSCOPY Left   . LUMBAR DISC SURGERY    . MASTECTOMY Left   . TONSILLECTOMY    . TUBAL LIGATION      OB History    Gravida Para Term Preterm AB Living   1 1           SAB TAB Ectopic Multiple Live Births           1       Home Medications    Prior to Admission medications   Medication Sig Start Date End Date Taking? Authorizing Provider  acetaminophen (TYLENOL) 650 MG CR tablet Take 1 tablet (650 mg total) by mouth every 8 (eight) hours as needed for pain. 05/29/17   Silverio Decamp, MD  ALPRAZolam Duanne Moron) 0.25 MG tablet Take 1 tablet (0.25 mg total) by mouth at bedtime as needed for anxiety. 08/13/17   Trixie Dredge, PA-C  AMBULATORY NON FORMULARY MEDICATION Knee-high, medium compression, graduated compression stockings. Apply to lower extremities. [email or web address], Zippered Compression Stockings, medium circ,  long length 04/03/17   Nelson Chimes [place], [place]  AMBULATORY NON FORMULARY MEDICATION Rolling 4-point walker 05/23/17   Silverio Decamp, MD  amLODipine (NORVASC) 5 MG tablet Take 5 mg by mouth daily.    [provider]  BYSTOLIC 20 MG TABS TAKE TWO TABLETS BY MOUTH EVERY DAY 08/08/17   Trixie Dredge, PA-C  clobetasol cream (TEMOVATE) 1.24 % Apply 1 application topically 2 (two) times daily. Patient not taking: Reported on 08/18/2017 06/02/17   Trixie Dredge, PA-C  diphenoxylate-atropine (LOMOTIL) 2.5-0.025 MG tablet TAKE ONE TABLET BY MOUTH EVERY DAY AS NEEDED FOR DIARRHEA OR LOOSE STOOLS 07/16/17   Trixie Dredge, PA-C  lisinopril (PRINIVIL,ZESTRIL) 40 MG tablet Take 1 tablet (40 mg total) by mouth daily. 04/02/17   Trixie Dredge, PA-C  loratadine (CLARITIN) 10 MG tablet Take 1 tablet (10 mg total) by mouth daily. 07/04/16   Duffy Bruce, MD  magnesium oxide (MAG-OX) 400 MG tablet Take 2 tablets (800 mg total) by mouth at bedtime. 07/31/17   Silverio Decamp, MD  meclizine (ANTIVERT) 25 MG tablet Take 1 tablet (25 mg total) by mouth 3 (three) times daily as needed for dizziness. 07/08/16   Emeterio Reeve, DO  Misc Natural Products (COSAMIN ASU ADVANCED FORMULA) CAPS Take 2 tablets by mouth 2 (two) times daily. 04/29/17   Silverio Decamp, MD  Multiple Minerals-Vitamins (CALCIUM-MAGNESIUM-ZINC-D3 PO) Take 1 tablet by mouth 2 (two) times daily.    [provider]  Multiple Vitamin (MULTIVITAMIN) tablet Take 1 tablet by mouth daily.    [provider]  omeprazole (PRILOSEC) 20 MG capsule TAKE ONE CAPSULE BY MOUTH EVERY DAY 06/07/17   Trixie Dredge, PA-C  OVER THE COUNTER MEDICATION Take 1 capsule by mouth daily. "Mega Red"    [provider]  Polyethylene Glycol 400 (BLINK TEARS OP) Place 1 drop into both eyes as needed (for dry eyes).    [provider]  sodium chloride (OCEAN) 0.65 % SOLN nasal spray Place 1 spray into both nostrils as needed for congestion. 07/04/16   Duffy Bruce, MD  Turmeric 500 MG TABS Take 1 tablet by mouth 2 (two) times daily. 04/29/17   Silverio Decamp, MD  valACYclovir (VALTREX) 500 MG tablet TAKE ONE TABLET BY MOUTH 2 TIMES A DAY. MAY REFILL IF NEW OUTBREAK OCCURS. 08/08/17   Trixie Dredge, PA-C    Family History Family History  Problem Relation Age of Onset  . Hypertension Mother   . Stroke Father     Social History Social History  Substance Use Topics  . Smoking status: Never Smoker  . Smokeless tobacco: Never Used  . Alcohol use No     Allergies   Cefuroxime  axetil; Edarbi [azilsartan]; Methylprednisolone; Nystop [nystatin]; Penicillins; Red dye; Tamiflu [oseltamivir phosphate]; Verelan [verapamil hcl er]; Zithromax [azithromycin]; Carbidopa-levodopa; Doxazosin mesylate; Hyoscyamine; Influenza vaccines; Other; Asa [aspirin]; Atenolol; Avelox [moxifloxacin]; Benicar [olmesartan]; Bentyl [dicyclomine hcl]; Benzonatate; Bisoprolol; Cardizem [diltiazem hcl]; Carvedilol; Cefdinir; Chlorthalidone; Ciprofloxacin; Citalopram; Clonidine derivatives; Diovan [valsartan]; Doxycycline; Erythromycin; Fioricet [butalbital-apap-caffeine]; Flexeril [cyclobenzaprine]; Furosemide; Hydralazine; Hydroxyzine; Isosorbide; Lyrica [pregabalin]; Macrobid [nitrofurantoin monohyd macro]; Mavik [trandolapril]; Metronidazole; Naproxen; Neo-polycin hc [bacitra-neomycin-polymyxin-hc]; Nexium [esomeprazole magnesium]; Norflex [orphenadrine citrate]; Oxybutynin chloride er; Paxil [paroxetine hcl]; Phenazopyridine; Prazosin; Prednisone; Promethazine; Prozac [fluoxetine hcl]; Spironolactone; Sulfur; Tizanidine; Valium [diazepam]; and Zoloft [sertraline hcl]   Review of Systems Review of Systems  Constitutional: Positive for fatigue. Negative for appetite change, chills, diaphoresis and fever.  HENT: Positive for congestion.   Eyes: Negative.  Respiratory: Negative.   Cardiovascular: Negative.   Gastrointestinal: Negative for abdominal pain and nausea.  Genitourinary: Negative.   Musculoskeletal: Positive for neck pain and neck stiffness. Negative for back pain.  Skin: Negative.   Neurological: Negative for headaches.     Physical Exam Triage Vital Signs ED Triage Vitals  Enc Vitals Group     BP 08/27/17 1649 (!) 143/81     Pulse Rate 08/27/17 1649 75     Resp 08/27/17 1649 18     Temp 08/27/17 1649 98.9 F (37.2 C)     Temp Source 08/27/17 1649 Oral     SpO2 08/27/17 1649 96 %     Weight 08/27/17 1650 186 lb (84.4 kg)     Height --      Head Circumference --      Peak Flow  --      Pain Score 08/27/17 1650 9     Pain Loc --      Pain Edu? --      Excl. in [place]? --    No data found.   Updated Vital Signs BP (!) 143/81 (BP Location: Left Arm)   Pulse 75   Temp 98.9 F (37.2 C) (Oral)   Resp 18   Wt 186 lb (84.4 kg)   SpO2 96%   BMI 30.02 kg/m   Visual Acuity Right Eye Distance:   Left Eye Distance:   Bilateral Distance:    Right Eye Near:   Left Eye Near:    Bilateral Near:     Physical Exam  Constitutional: She appears well-developed and well-nourished. No distress.  HENT:  Head: Normocephalic and atraumatic.  Right Ear: Tympanic membrane, external ear and ear canal normal.  Left Ear: Tympanic membrane, external ear and ear canal normal.  Nose: Nose normal.  Mouth/Throat: Oropharynx is clear and moist.  Eyes: Pupils are equal, round, and reactive to light. Conjunctivae are normal.  Neck: Normal range of motion. Neck supple.    Tenderness left neck and trapezius  as noted on diagram.  Right trapezius has pain but no tenderness to palpation.  Distal strength and sensation intact.  Lateral notes are prominent and tender to palpation  Cardiovascular: Normal heart sounds.   Pulmonary/Chest: Breath sounds normal.  Abdominal: There is no tenderness.  Musculoskeletal: Normal range of motion.  Lymphadenopathy:    She has cervical adenopathy.  Neurological: She is alert.  Skin: Skin is warm and dry. No rash noted.  Nursing note and vitals reviewed.    UC Treatments / Results  Labs (all labs ordered are listed, but only abnormal results are displayed) Labs Reviewed - No data to display  EKG  EKG Interpretation None       Radiology No results found.  Procedures Procedures (including critical care time)  Medications Ordered in UC Medications - No data to display   Initial Impression / Assessment and Plan / UC Course  I have reviewed the triage vital signs and the nursing notes.  Pertinent labs & imaging results that were  available during my care of the patient were reviewed by me and considered in my medical decision making (see chart for details).    Dispensed soft cervical collar. Wear cervical collar daily.  Apply ice pack for 20 to 30 minutes, 3 to 4 times daily  Continue until pain and swelling decrease. May take Tylenol for pain.  Suspect early developing viral URI also. If cold symptoms develop, try the following: Take plain guaifenesin (1200mg  extended  release tabs such as Mucinex) twice daily, with plenty of water, for cough and congestion.   Get adequate rest.   Also recommend using saline nasal spray several times daily and saline nasal irrigation (AYR is a common brand).    Try warm salt water gargles for sore throat.  Stop all antihistamines for now, and other non-prescription cough/cold preparations.   Follow-up with family doctor if not improving about 7 to 10 days.    Final Clinical Impressions(s) / UC Diagnoses   Final diagnoses:  Radiculopathy of cervical spine    New Prescriptions New Prescriptions   No medications on file        Kandra Nicolas, MD 08/28/17 1257

## 2017-08-27 NOTE — ED Triage Notes (Signed)
Pt c/o neck pain and HA x 2 days. Denies injury. She reports having diarrhea on 08/23/2017 that lasted for 2 days and has resolved.

## 2017-08-27 NOTE — Discharge Instructions (Signed)
Wear cervical collar.  Apply ice pack for 20 to 30 minutes, 3 to 4 times daily  Continue until pain and swelling decrease. May take Tylenol for pain. If cold symptoms develop, try the following: Take plain guaifenesin (1200mg  extended release tabs such as Mucinex) twice daily, with plenty of water, for cough and congestion.   Get adequate rest.   Also recommend using saline nasal spray several times daily and saline nasal irrigation (AYR is a common brand).    Try warm salt water gargles for sore throat.  Stop all antihistamines for now, and other non-prescription cough/cold preparations.   Follow-up with family doctor if not improving about 7 to 10 days.

## 2017-08-28 ENCOUNTER — Ambulatory Visit: Payer: Medicare Other | Admitting: Sports Medicine

## 2017-09-05 ENCOUNTER — Other Ambulatory Visit: Payer: Self-pay | Admitting: Physician Assistant

## 2017-09-05 DIAGNOSIS — B009 Herpesviral infection, unspecified: Secondary | ICD-10-CM

## 2017-09-10 ENCOUNTER — Ambulatory Visit (INDEPENDENT_AMBULATORY_CARE_PROVIDER_SITE_OTHER): Payer: Medicare Other | Admitting: Obstetrics and Gynecology

## 2017-09-10 ENCOUNTER — Encounter: Payer: Self-pay | Admitting: Obstetrics and Gynecology

## 2017-09-10 VITALS — BP 165/73 | HR 68 | Resp 16 | Ht 66.0 in | Wt 187.0 lb

## 2017-09-10 DIAGNOSIS — A6004 Herpesviral vulvovaginitis: Secondary | ICD-10-CM

## 2017-09-10 MED ORDER — NYSTATIN-TRIAMCINOLONE 100000-0.1 UNIT/GM-% EX OINT: 1.0000 | TOPICAL_OINTMENT | Freq: Two times a day (BID) | CUTANEOUS | 0 refills | Status: AC

## 2017-09-10 NOTE — Progress Notes (Signed)
76 yo G1P1 here for the evaluation of vaginitis. Patient reports persistent vaginal pruritis following 2 treatments with diflucan. She reports a history of genital herpes and has started treated with valtrex 2 days ago with significant improvement in her symptoms. She denies any pelvic pain, abnormal discharge or vaginal bleeding. She is not sexually active. 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 Anxiety   . Back pain, chronic   . Breast cancer ([place])    left  . 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 GERD (gastroesophageal reflux disease)   . Hemorrhoid   . HSV infection   . Hypertension   . IBS (irritable bowel syndrome)   . Migraine   . Vertigo    Past Surgical History:  Procedure Laterality Date  . ABDOMINAL HYSTERECTOMY    . BREAST SURGERY    . HERNIA REPAIR    . KNEE ARTHROSCOPY Left   . LUMBAR DISC SURGERY    . MASTECTOMY Left   . TONSILLECTOMY    . TUBAL LIGATION     Family History  Problem Relation Age of Onset  . Hypertension Mother   . Stroke Father    Social History  Substance Use Topics  . Smoking status: Never Smoker  . Smokeless tobacco: Never Used  . Alcohol use No   ROS See pertinent in HPI  Blood pressure (!) 165/73, pulse 68, resp. rate 16, height 5\' 6"  (1.676 m), weight 187 lb (84.8 kg). GENERAL: Well-developed, well-nourished female in no acute distress.  ABDOMEN: Soft, nontender, nondistended. No organomegaly. PELVIC: Normal external female genitalia. 3 small herpetic lesions, 1 slightly crusted over, the other 2 are still in ulcer stage Vagina is pale and atrophic.  Normal discharge.    A/P 76 yo with active genital herpes - Continue valtrex - Rx vaginal cream provided to help with pruritis  - RTC in 2 weeks if symptoms do not disappear

## 2017-09-11 ENCOUNTER — Ambulatory Visit (HOSPITAL_COMMUNITY)
Admission: RE | Admit: 2017-09-11 | Discharge: 2017-09-11 | Disposition: A | Discharge status: Home / Self Care | Payer: Medicare Other | Source: Ambulatory Visit | Attending: Vascular Surgery | Admitting: Vascular Surgery

## 2017-09-11 DIAGNOSIS — I872 Venous insufficiency (chronic) (peripheral): Secondary | ICD-10-CM | POA: Diagnosis not present

## 2017-09-11 DIAGNOSIS — M7989 Other specified soft tissue disorders: Secondary | ICD-10-CM | POA: Diagnosis present

## 2017-09-16 ENCOUNTER — Ambulatory Visit: Payer: Medicare Other | Admitting: Obstetrics & Gynecology

## 2017-09-16 ENCOUNTER — Encounter (HOSPITAL_COMMUNITY): Payer: Medicare Other

## 2017-09-23 ENCOUNTER — Encounter: Payer: Self-pay | Admitting: Sports Medicine

## 2017-09-23 ENCOUNTER — Ambulatory Visit (INDEPENDENT_AMBULATORY_CARE_PROVIDER_SITE_OTHER): Payer: Medicare Other | Admitting: Sports Medicine

## 2017-09-23 DIAGNOSIS — L219 Seborrheic dermatitis, unspecified: Secondary | ICD-10-CM | POA: Diagnosis not present

## 2017-09-23 DIAGNOSIS — M5416 Radiculopathy, lumbar region: Secondary | ICD-10-CM | POA: Diagnosis not present

## 2017-09-23 DIAGNOSIS — B009 Herpesviral infection, unspecified: Secondary | ICD-10-CM | POA: Diagnosis not present

## 2017-09-23 DIAGNOSIS — Z23 Encounter for immunization: Secondary | ICD-10-CM | POA: Diagnosis not present

## 2017-09-23 DIAGNOSIS — R600 Localized edema: Secondary | ICD-10-CM | POA: Diagnosis not present

## 2017-09-23 DIAGNOSIS — I872 Venous insufficiency (chronic) (peripheral): Secondary | ICD-10-CM | POA: Diagnosis not present

## 2017-09-23 NOTE — Progress Notes (Signed)
  Subjective:    CC: Follow-up  HPI: Venous insufficiency: Venous reflux ultrasound did show femoral vein insufficiency, lower extremity compression hose which we have been trying to get her to wear for a long time now have finally started working.  Left lower leg cramping, with pain into the big toe, likely L4 radiculopathy, she has refused epidurals as well as other medications for this, she understands that all she has to do is let us know when she is ready.  No bowel or bladder dysfunction, saddle numbness, constitutional symptoms.  Past medical history:  Negative.  See flowsheet/record as well for more information.  Surgical history: Negative.  See flowsheet/record as well for more information.  Family history: Negative.  See flowsheet/record as well for more information.  Social history: Negative.  See flowsheet/record as well for more information.  Allergies, and medications have been entered into the medical record, reviewed, and no changes needed.   Review of Systems: No fevers, chills, night sweats, weight loss, chest pain, or shortness of breath.   Objective:    General: Well Developed, well nourished, and in no acute distress.  Neuro: Alert and oriented x3, extra-ocular muscles intact, sensation grossly intact.  HEENT: Normocephalic, atraumatic, pupils equal round reactive to light, neck supple, no masses, no lymphadenopathy, thyroid nonpalpable.  Skin: Warm and dry, no rashes. Cardiac: Regular rate and rhythm, no murmurs rubs or gallops, no lower extremity edema.  Respiratory: Clear to auscultation bilaterally. Not using accessory muscles, speaking in full sentences.  Impression and Recommendations:    Edema of both lower extremities due to peripheral venous insufficiency Venous reflux ultrasound showed insufficiency of bilateral common femoral veins. Lower compression hose have relieved the symptoms.  Left lumbar radiculitis Left L4 radiculitis, persistent symptoms,  continues to decline epidural injection. Advised her that no intervention was needed if she can live with the discomfort.  Seborrheic dermatitis With scalp seborrhea, advised to follow up with her PCP for further evaluation and treatment of this.  HSV (herpes simplex virus) infection Off of Valtrex, okay for high-dose flu vaccine.  I spent 25 minutes with this patient, greater than 50% was face-to-face time counseling regarding the above diagnoses ___________________________________________ Gwen Her. Dianah Field, M.D., ABFM., CAQSM. Primary Care and [place] Instructor of [place] of [facility] of [employer]

## 2017-09-23 NOTE — Assessment & Plan Note (Signed)
Off of Valtrex, okay for high-dose flu vaccine.

## 2017-09-23 NOTE — Assessment & Plan Note (Signed)
Left L4 radiculitis, persistent symptoms, continues to decline epidural injection. Advised her that no intervention was needed if she can live with the discomfort.

## 2017-09-23 NOTE — Assessment & Plan Note (Signed)
With scalp seborrhea, advised to follow up with her PCP for further evaluation and treatment of this.

## 2017-09-23 NOTE — Assessment & Plan Note (Signed)
Venous reflux ultrasound showed insufficiency of bilateral common femoral veins. Lower compression hose have relieved the symptoms.

## 2017-10-02 ENCOUNTER — Other Ambulatory Visit: Payer: Self-pay | Admitting: Physician Assistant

## 2017-10-02 DIAGNOSIS — I10 Essential (primary) hypertension: Secondary | ICD-10-CM

## 2017-10-07 ENCOUNTER — Encounter: Payer: Self-pay | Admitting: Obstetrics and Gynecology

## 2017-10-07 ENCOUNTER — Ambulatory Visit (INDEPENDENT_AMBULATORY_CARE_PROVIDER_SITE_OTHER): Payer: Medicare Other | Admitting: Obstetrics and Gynecology

## 2017-10-07 VITALS — BP 158/72 | HR 66 | Resp 16 | Ht 66.0 in | Wt 185.0 lb

## 2017-10-07 DIAGNOSIS — L309 Dermatitis, unspecified: Secondary | ICD-10-CM | POA: Diagnosis not present

## 2017-10-07 DIAGNOSIS — Z1239 Encounter for other screening for malignant neoplasm of breast: Secondary | ICD-10-CM

## 2017-10-07 MED ORDER — LIDOCAINE 5 % EX OINT: 1.0000 "application " | TOPICAL_OINTMENT | CUTANEOUS | 0 refills | Status: AC | PRN

## 2017-10-07 NOTE — Progress Notes (Signed)
76 yo returning to the evaluation of vaginal irritation. Patient reports complete resolution of her previous vulva symptoms and denies any pruritis or pain. However, she reports the development of burning with urination 2 days ago. She states the symptoms are not that of a UTI as the burning sensation does not come from her urethra but rather from her skin. She admits to urinary incontinence and often wears 2 cotton underwear at the same time in the hopes to help absorb the urine better. She states that she tries to keep the area dry and often changes underwear 3-4 times a day. Patient also desires a referral for her mammogram. She did not have one this year.  Past Medical History:  Diagnosis Date  . Anxiety   . Back pain, chronic   . Breast cancer ([place])    left  . 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 GERD (gastroesophageal reflux disease)   . Hemorrhoid   . HSV infection   . Hypertension   . IBS (irritable bowel syndrome)   . Migraine   . Vertigo    Past Surgical History:  Procedure Laterality Date  . ABDOMINAL HYSTERECTOMY    . BREAST SURGERY    . HERNIA REPAIR    . KNEE ARTHROSCOPY Left   . LUMBAR DISC SURGERY    . MASTECTOMY Left   . TONSILLECTOMY    . TUBAL LIGATION     Family History  Problem Relation Age of Onset  . Hypertension Mother   . Stroke Father    Social History   Tobacco Use  . Smoking status: Never Smoker  . Smokeless tobacco: Never Used  Substance Use Topics  . Alcohol use: No    Alcohol/week: 0.0 oz  . Drug use: No   ROS See pertinent in HPI  GENERAL: Well-developed, well-nourished female in no acute distress.  BREASTS: Left breast absent from previous massectomy. Palpable 2 cm area at 6 o' clock position under nipple. No palpable lymphadenopathy, skin changes, or nipple drainage. ABDOMEN: Soft, nontender, nondistended. No organomegaly. PELVIC: Normal external female genitalia with area of erythema along the inner aspect of both  labia majora consistent with prolonged urinary skin contact EXTREMITIES: No cyanosis, clubbing, or edema, 2+ distal pulses.  A/P 76 yo with vulva irritation secondary to prolonged exposure to urine - Advised to wear depend underwear in lieu of 2 underwear - Advised to apply a skin protectant (similar to diaper rash cream) - lidocaine cream provided to provide relief with urination - Screening mammogram ordered - RTC in 2 weeks if no improvement in symptoms

## 2017-10-11 ENCOUNTER — Other Ambulatory Visit: Payer: Self-pay | Admitting: Physician Assistant

## 2017-10-11 DIAGNOSIS — B009 Herpesviral infection, unspecified: Secondary | ICD-10-CM

## 2017-10-15 LAB — HM MAMMOGRAPHY

## 2017-10-20 ENCOUNTER — Encounter: Payer: Self-pay | Admitting: Obstetrics and Gynecology

## 2017-10-26 ENCOUNTER — Other Ambulatory Visit: Payer: Self-pay | Admitting: Physician Assistant

## 2017-10-26 DIAGNOSIS — K580 Irritable bowel syndrome with diarrhea: Secondary | ICD-10-CM

## 2017-11-27 ENCOUNTER — Other Ambulatory Visit: Payer: Self-pay | Admitting: Sports Medicine

## 2017-12-05 ENCOUNTER — Other Ambulatory Visit: Payer: Self-pay | Admitting: Physician Assistant

## 2017-12-05 ENCOUNTER — Other Ambulatory Visit: Payer: Self-pay | Admitting: Sports Medicine

## 2017-12-10 ENCOUNTER — Other Ambulatory Visit: Payer: Self-pay | Admitting: Sports Medicine

## 2017-12-11 ENCOUNTER — Telehealth: Payer: Self-pay | Admitting: *Deleted

## 2017-12-11 ENCOUNTER — Telehealth: Payer: Self-pay | Admitting: Physician Assistant

## 2017-12-11 DIAGNOSIS — Z13220 Encounter for screening for lipoid disorders: Secondary | ICD-10-CM

## 2017-12-11 DIAGNOSIS — Z0000 Encounter for general adult medical examination without abnormal findings: Secondary | ICD-10-CM

## 2017-12-11 DIAGNOSIS — I10 Essential (primary) hypertension: Secondary | ICD-10-CM

## 2017-12-11 DIAGNOSIS — Z1329 Encounter for screening for other suspected endocrine disorder: Secondary | ICD-10-CM

## 2017-12-11 DIAGNOSIS — R7303 Prediabetes: Secondary | ICD-10-CM

## 2017-12-11 NOTE — Telephone Encounter (Signed)
Labs ordered. Attempted to notify Pt, no answer and no VM set up.

## 2017-12-11 NOTE — Telephone Encounter (Signed)
Generic lomotil has been approved.pharmacy notified

## 2017-12-11 NOTE — Telephone Encounter (Signed)
Pt called to scheduled her annual wellness exam. She wants lab order sent down on 1/16/appt is 1/18.

## 2017-12-18 ENCOUNTER — Encounter: Payer: Self-pay | Admitting: Physician Assistant

## 2017-12-18 DIAGNOSIS — E781 Pure hyperglyceridemia: Secondary | ICD-10-CM | POA: Insufficient documentation

## 2017-12-18 HISTORY — DX: Pure hyperglyceridemia: E78.1

## 2017-12-18 LAB — COMPLETE METABOLIC PANEL WITH GFR
AG Ratio: 1.4 (calc) (ref 1.0–2.5)
ALT: 13 U/L (ref 6–29)
AST: 18 U/L (ref 10–35)
Albumin: 4.4 g/dL (ref 3.6–5.1)
Alkaline phosphatase (APISO): 86 U/L (ref 33–130)
BILIRUBIN TOTAL: 0.5 mg/dL (ref 0.2–1.2)
BUN: 17 mg/dL (ref 7–25)
CALCIUM: 10 mg/dL (ref 8.6–10.4)
CO2: 29 mmol/L (ref 20–32)
Chloride: 100 mmol/L (ref 98–110)
Creat: 0.85 mg/dL (ref 0.60–0.93)
GFR, EST AFRICAN AMERICAN: 77 mL/min/{1.73_m2} (ref >=60)
GFR, EST NON AFRICAN AMERICAN: 67 mL/min/{1.73_m2} (ref >=60)
GLOBULIN: 3.1 g/dL (ref 1.9–3.7)
Glucose, Bld: 126 mg/dL — ABNORMAL HIGH (ref 65–99)
Potassium: 4 mmol/L (ref 3.5–5.3)
SODIUM: 138 mmol/L (ref 135–146)
TOTAL PROTEIN: 7.5 g/dL (ref 6.1–8.1)

## 2017-12-18 LAB — HEMOGLOBIN A1C
EAG (MMOL/L): 6.6 (calc)
Hgb A1c MFr Bld: 5.8 % of total Hgb — ABNORMAL HIGH (ref <5.7)
Mean Plasma Glucose: 120 (calc)

## 2017-12-18 LAB — CBC WITH DIFFERENTIAL/PLATELET
BASOS PCT: 0.7 %
Basophils Absolute: 62 cells/uL (ref 0–200)
EOS ABS: 89 {cells}/uL (ref 15–500)
Eosinophils Relative: 1 %
HEMATOCRIT: 43.1 % (ref 35.0–45.0)
Hemoglobin: 14.6 g/dL (ref 11.7–15.5)
LYMPHS ABS: 2777 {cells}/uL (ref 850–3900)
MCH: 29.1 pg (ref 27.0–33.0)
MCHC: 33.9 g/dL (ref 32.0–36.0)
MCV: 86 fL (ref 80.0–100.0)
MPV: 10.1 fL (ref 7.5–12.5)
Monocytes Relative: 6.4 %
NEUTROS PCT: 60.7 %
Neutro Abs: 5402 cells/uL (ref 1500–7800)
Platelets: 281 10*3/uL (ref 140–400)
RBC: 5.01 10*6/uL (ref 3.80–5.10)
RDW: 13.2 % (ref 11.0–15.0)
Total Lymphocyte: 31.2 %
WBC: 8.9 10*3/uL (ref 3.8–10.8)
WBCMIX: 570 {cells}/uL (ref 200–950)

## 2017-12-18 LAB — LIPID PANEL
Cholesterol: 177 mg/dL (ref <200)
HDL: 51 mg/dL (ref >50)
LDL Cholesterol (Calc): 94 mg/dL (calc)
NON-HDL CHOLESTEROL (CALC): 126 mg/dL (ref <130)
Total CHOL/HDL Ratio: 3.5 (calc) (ref <5.0)
Triglycerides: 228 mg/dL — ABNORMAL HIGH (ref <150)

## 2017-12-18 LAB — TSH: TSH: 3.88 m[IU]/L (ref 0.40–4.50)

## 2017-12-18 NOTE — Telephone Encounter (Signed)
Labs look great - prediabetes is stable - wbc is normal - normal kidney function - triglycerides are still a little high, but total and LDL cholesterol in a healthy range - normal thyroid

## 2017-12-19 ENCOUNTER — Ambulatory Visit (INDEPENDENT_AMBULATORY_CARE_PROVIDER_SITE_OTHER): Payer: Medicare Other | Admitting: Physician Assistant

## 2017-12-19 ENCOUNTER — Encounter: Payer: Self-pay | Admitting: Physician Assistant

## 2017-12-19 VITALS — BP 139/84 | HR 60 | Temp 98.8°F | Ht 66.5 in | Wt 188.0 lb

## 2017-12-19 DIAGNOSIS — Z0000 Encounter for general adult medical examination without abnormal findings: Secondary | ICD-10-CM | POA: Diagnosis not present

## 2017-12-19 DIAGNOSIS — Z23 Encounter for immunization: Secondary | ICD-10-CM

## 2017-12-19 DIAGNOSIS — R3129 Other microscopic hematuria: Secondary | ICD-10-CM | POA: Diagnosis not present

## 2017-12-19 DIAGNOSIS — Z2821 Immunization not carried out because of patient refusal: Secondary | ICD-10-CM | POA: Insufficient documentation

## 2017-12-19 DIAGNOSIS — Z9181 History of falling: Secondary | ICD-10-CM | POA: Insufficient documentation

## 2017-12-19 DIAGNOSIS — Z13820 Encounter for screening for osteoporosis: Secondary | ICD-10-CM | POA: Diagnosis not present

## 2017-12-19 DIAGNOSIS — R82998 Other abnormal findings in urine: Secondary | ICD-10-CM | POA: Diagnosis not present

## 2017-12-19 DIAGNOSIS — Z9989 Dependence on other enabling machines and devices: Secondary | ICD-10-CM

## 2017-12-19 DIAGNOSIS — N3946 Mixed incontinence: Secondary | ICD-10-CM | POA: Diagnosis not present

## 2017-12-19 DIAGNOSIS — I10 Essential (primary) hypertension: Secondary | ICD-10-CM | POA: Diagnosis not present

## 2017-12-19 DIAGNOSIS — N3281 Overactive bladder: Secondary | ICD-10-CM

## 2017-12-19 LAB — POCT URINALYSIS DIPSTICK
BILIRUBIN UA: NEGATIVE
Glucose, UA: NEGATIVE
KETONES UA: NEGATIVE
Nitrite, UA: NEGATIVE
Protein, UA: NEGATIVE
Spec Grav, UA: 1.015 (ref 1.010–1.025)
Urobilinogen, UA: 0.2 E.U./dL
pH, UA: 7 (ref 5.0–8.0)

## 2017-12-19 MED ORDER — LISINOPRIL 40 MG PO TABS: 40.0000 mg | ORAL_TABLET | Freq: Every day | ORAL | 1 refills | Status: DC

## 2017-12-19 MED ORDER — MIRABEGRON ER 25 MG PO TB24: 25.0000 mg | ORAL_TABLET | Freq: Every day | ORAL | 5 refills | Status: DC

## 2017-12-19 MED ORDER — NEBIVOLOL HCL 20 MG PO TABS: ORAL_TABLET | ORAL | 5 refills | Status: DC

## 2017-12-19 NOTE — Patient Instructions (Addendum)
Skin Care and Bowel Hygiene   Anyone who has frequent bowel movements, diarrhea, or bowel leakage (fecal incontinence) may experience soreness or skin irritation around the anal region.  Occasionally, the skin can become so inflamed that it breaks into open sores.  Prevent skin breakdown by following good skin care habits.  Cleaning and Washing Techniques After having a bowel movement, men and women should tighten their anal sphincter before wiping.  Women should always wipe from front to back to prevent fecal matter from getting into the urethra and vagina.   Tips for Cleaning and Washing . wipe from front to back towards the anus . always wipe gently with soft toilet paper, or ideally with moist toilet paper . wipe only once with each piece of toilet paper so as not to re-contaminate the area . wash in warm water alone or with a minimal amount of mild, fragrance-free soap . use non-biological washing powder . gently pat skin completely dry, avoiding rubbing . if drying the skin after washing is difficult or uncomfortable, try using a hairdryer on a low setting (use very carefully) . allow air to get to the irritated area for some part of every day . use protective skin creams containing zinc as recommended by your doctor  What To Avoid . baths with extra-hot water . soaking for long periods of time in the bathtub . disinfectants and antiseptics  . bath oils, bath salts, and talcum powder . using plastic pants, pads, and sheets, which cause sweating . scratching at the irritated area  Additional Tips . some people find that citrus and acidic foods cause or worsen skin irritation . eat a healthy, balanced diet that is high in fiber  . drink plenty of fluids . wear cotton underwear to allow the skin to breath . talk to your healthcare provider about further treatment options; persistent problems need medical attention   2007, [employer] Doc.25   Urinary Frequency,  Adult Urinary frequency means urinating more often than usual. People with urinary frequency urinate at least 8 times in 24 hours, even if they drink a normal amount of fluid. Although they urinate more often than normal, the total amount of urine produced in a day may be normal. Urinary frequency is also called pollakiuria. What are the causes? This condition may be caused by:  A urinary tract infection.  Obesity.  Bladder problems, such as bladder stones.  Caffeine or alcohol.  Eating food or drinking fluids that irritate the bladder. These include coffee, tea, soda, artificial sweeteners, citrus, tomato-based foods, and chocolate.  Certain medicines, such as medicines that help the body get rid of extra fluid (diuretics).  Muscle or nerve weakness.  Overactive bladder.  Chronic diabetes.  Interstitial cystitis.  In men, problems with the prostate, such as an enlarged prostate.  In women, pregnancy.  In some cases, the cause may not be known. What increases the risk? This condition is more likely to develop in:  Women who have gone through menopause.  Men with prostate problems.  People with a disease or injury that affects the nerves or spinal cord.  People who have or have had a condition that affects the brain, such as a stroke.  What are the signs or symptoms? Symptoms of this condition include:  Feeling an urgent need to urinate often. The stress and anxiety of needing to find a bathroom quickly can make this urge worse.  Urinating 8 or more times in 24 hours.  Urinating as often  as every 1 to 2 hours.  How is this diagnosed? This condition is diagnosed based on your symptoms, your medical history, and a physical exam. You may have tests, such as:  Blood tests.  Urine tests.  Imaging tests, such as X-rays or ultrasounds.  A bladder test.  A test of your neurological system. This is the body system that senses the need to urinate.  A test to check  for problems in the urethra and bladder called cystoscopy.  You may also be asked to keep a bladder diary. A bladder diary is a record of what you eat and drink, how often you urinate, and how much you urinate. You may need to see a health care provider who specializes in conditions of the urinary tract (urologist) or kidneys (nephrologist). How is this treated? Treatment for this condition depends on the cause. Sometimes the condition goes away on its own and treatment is not necessary. If treatment is needed, it may include:  Taking medicine.  Learning exercises that strengthen the muscles that help control urination.  Following a bladder training program. This may include: ? Learning to delay going to the bathroom. ? Double urinating (voiding). This helps if you are not completely emptying your bladder. ? Scheduled voiding.  Making diet changes, such as: ? Avoiding caffeine. ? Drinking fewer fluids, especially alcohol. ? Not drinking in the evening. ? Not having foods or drinks that may irritate the bladder. ? Eating foods that help prevent or ease constipation. Constipation can make this condition worse.  Having the nerves in your bladder stimulated. There are two options for stimulating the nerves to your bladder: ? Outpatient electrical nerve stimulation. This is done by your health care provider. ? Surgery to implant a bladder pacemaker. The pacemaker helps to control the urge to urinate.  Follow these instructions at home:  Keep a bladder diary if told to by your health care provider.  Take over-the-counter and prescription medicines only as told by your health care provider.  Do any exercises as told by your health care provider.  Follow a bladder training program as told by your health care provider.  Make any recommended diet changes.  Keep all follow-up visits as told by your health care provider. This is important. Contact a health care provider if:  You start  urinating more often.  You feel pain or irritation when you urinate.  You notice blood in your urine.  Your urine looks cloudy.  You develop a fever.  You begin vomiting. Get help right away if:  You are unable to urinate. This information is not intended to replace advice given to you by your health care provider. Make sure you discuss any questions you have with your health care provider. Document Released: 09/14/2009 Document Revised: 12/20/2015 Document Reviewed: 06/14/2015 Elsevier Interactive Patient Education  2018 [employer].   Fat and Cholesterol Restricted Diet Getting too much fat and cholesterol in your diet may cause health problems. Following this diet helps keep your fat and cholesterol at normal levels. This can keep you from getting sick. What types of fat should I choose?  Choose monosaturated and polyunsaturated fats. These are found in foods such as olive oil, canola oil, flaxseeds, walnuts, almonds, and seeds.  Eat more omega-3 fats. Good choices include salmon, mackerel, sardines, tuna, flaxseed oil, and ground flaxseeds.  Limit saturated fats. These are in animal products such as meats, butter, and cream. They can also be in plant products such as palm oil,  palm kernel oil, and coconut oil.  Avoid foods with partially hydrogenated oils in them. These contain trans fats. Examples of foods that have trans fats are stick margarine, some tub margarines, cookies, crackers, and other baked goods. What general guidelines do I need to follow?  Check food labels. Look for the words "trans fat" and "saturated fat."  When preparing a meal: ? Fill half of your plate with vegetables and green salads. ? Fill one fourth of your plate with whole grains. Look for the word "whole" as the first word in the ingredient list. ? Fill one fourth of your plate with lean protein foods.  Eat more foods that have fiber, like apples, carrots, beans, peas, and barley.  Eat more  home-cooked foods. Eat less at restaurants and buffets.  Limit or avoid alcohol.  Limit foods high in starch and sugar.  Limit fried foods.  Cook foods without frying them. Baking, boiling, grilling, and broiling are all great options.  Lose weight if you are overweight. Losing even a small amount of weight can help your overall health. It can also help prevent diseases such as diabetes and heart disease. What foods can I eat? Grains Whole grains, such as whole wheat or whole grain breads, crackers, cereals, and pasta. Unsweetened oatmeal, bulgur, barley, quinoa, or brown rice. Corn or whole wheat flour tortillas. Vegetables Fresh or frozen vegetables (raw, steamed, roasted, or grilled). Green salads. Fruits All fresh, canned (in natural juice), or frozen fruits. Meat and Other Protein Products Ground beef (85% or leaner), grass-fed beef, or beef trimmed of fat. Skinless chicken or [place]. Ground chicken or [place]. Pork trimmed of fat. All fish and seafood. Eggs. Dried beans, peas, or lentils. Unsalted nuts or seeds. Unsalted canned or dry beans. Dairy Low-fat dairy products, such as skim or 1% milk, 2% or reduced-fat cheeses, low-fat ricotta or cottage cheese, or plain low-fat yogurt. Fats and Oils Tub margarines without trans fats. Light or reduced-fat mayonnaise and salad dressings. Avocado. Olive, canola, sesame, or safflower oils. Natural peanut or almond butter (choose ones without added sugar and oil). The items listed above may not be a complete list of recommended foods or beverages. Contact your dietitian for more options. What foods are not recommended? Grains White bread. White pasta. White rice. Cornbread. Bagels, pastries, and croissants. Crackers that contain trans fat. Vegetables White potatoes. Corn. Creamed or fried vegetables. Vegetables in a cheese sauce. Fruits Dried fruits. Canned fruit in light or heavy syrup. Fruit juice. Meat and Other Protein Products Fatty  cuts of meat. Ribs, chicken wings, bacon, sausage, bologna, salami, chitterlings, fatback, hot dogs, bratwurst, and packaged luncheon meats. Liver and organ meats. Dairy Whole or 2% milk, cream, half-and-half, and cream cheese. Whole milk cheeses. Whole-fat or sweetened yogurt. Full-fat cheeses. Nondairy creamers and whipped toppings. Processed cheese, cheese spreads, or cheese curds. Sweets and Desserts Corn syrup, sugars, honey, and molasses. Candy. Jam and jelly. Syrup. Sweetened cereals. Cookies, pies, cakes, donuts, muffins, and ice cream. Fats and Oils Butter, stick margarine, lard, shortening, ghee, or bacon fat. Coconut, palm kernel, or palm oils. Beverages Alcohol. Sweetened drinks (such as sodas, lemonade, and fruit drinks or punches). The items listed above may not be a complete list of foods and beverages to avoid. Contact your dietitian for more information. This information is not intended to replace advice given to you by your health care provider. Make sure you discuss any questions you have with your health care provider. Document Released: 05/19/2012 Document Revised: 07/25/2016  Document Reviewed: 02/17/2014 Elsevier Interactive Patient Education  2018 [place] Prevention in the [facility] can cause injuries. They can happen to people of all ages. There are many things you can do to make your home safe and to help prevent falls. What can I do on the outside of my home?  Regularly fix the edges of walkways and driveways and fix any cracks.  Remove anything that might make you trip as you walk through a door, such as a raised step or threshold.  Trim any bushes or trees on the path to your home.  Use bright outdoor lighting.  Clear any walking paths of anything that might make someone trip, such as rocks or tools.  Regularly check to see if handrails are loose or broken. Make sure that both sides of any steps have handrails.  Any raised decks and porches  should have guardrails on the edges.  Have any leaves, snow, or ice cleared regularly.  Use sand or salt on walking paths during winter.  Clean up any spills in your garage right away. This includes oil or grease spills. What can I do in the bathroom?  Use night lights.  Install grab bars by the toilet and in the tub and shower. Do not use towel bars as grab bars.  Use non-skid mats or decals in the tub or shower.  If you need to sit down in the shower, use a plastic, non-slip stool.  Keep the floor dry. Clean up any water that spills on the floor as soon as it happens.  Remove soap buildup in the tub or shower regularly.  Attach bath mats securely with double-sided non-slip rug tape.  Do not have throw rugs and other things on the floor that can make you trip. What can I do in the bedroom?  Use night lights.  Make sure that you have a light by your bed that is easy to reach.  Do not use any sheets or blankets that are too big for your bed. They should not hang down onto the floor.  Have a firm chair that has side arms. You can use this for support while you get dressed.  Do not have throw rugs and other things on the floor that can make you trip. What can I do in the kitchen?  Clean up any spills right away.  Avoid walking on wet floors.  Keep items that you use a lot in easy-to-reach places.  If you need to reach something above you, use a strong step stool that has a grab bar.  Keep electrical cords out of the way.  Do not use floor polish or wax that makes floors slippery. If you must use wax, use non-skid floor wax.  Do not have throw rugs and other things on the floor that can make you trip. What can I do with my stairs?  Do not leave any items on the stairs.  Make sure that there are handrails on both sides of the stairs and use them. Fix handrails that are broken or loose. Make sure that handrails are as long as the stairways.  Check any carpeting to  make sure that it is firmly attached to the stairs. Fix any carpet that is loose or worn.  Avoid having throw rugs at the top or bottom of the stairs. If you do have throw rugs, attach them to the floor with carpet tape.  Make sure that you have a light switch at  the top of the stairs and the bottom of the stairs. If you do not have them, ask someone to add them for you. What else can I do to help prevent falls?  Wear shoes that: ? Do not have high heels. ? Have rubber bottoms. ? Are comfortable and fit you well. ? Are closed at the toe. Do not wear sandals.  If you use a stepladder: ? Make sure that it is fully opened. Do not climb a closed stepladder. ? Make sure that both sides of the stepladder are locked into place. ? Ask someone to hold it for you, if possible.  Clearly mark and make sure that you can see: ? Any grab bars or handrails. ? First and last steps. ? Where the edge of each step is.  Use tools that help you move around (mobility aids) if they are needed. These include: ? Canes. ? Walkers. ? Scooters. ? Crutches.  Turn on the lights when you go into a dark area. Replace any light bulbs as soon as they burn out.  Set up your furniture so you have a clear path. Avoid moving your furniture around.  If any of your floors are uneven, fix them.  If there are any pets around you, be aware of where they are.  Review your medicines with your doctor. Some medicines can make you feel dizzy. This can increase your chance of falling. Ask your doctor what other things that you can do to help prevent falls. This information is not intended to replace advice given to you by your health care provider. Make sure you discuss any questions you have with your health care provider. Document Released: 09/14/2009 Document Revised: 04/25/2016 Document Reviewed: 12/23/2014 Elsevier Interactive Patient Education  [employer].

## 2017-12-19 NOTE — Progress Notes (Signed)
Subjective:   Dana Calderon is a 77 y.o. female who presents for Medicare Annual (Subsequent) preventive examination.  Current concerns: medication refills, incontinence  Incontinence: patient reports longstanding history of urinary leakage with coughing/sneezing. Recently she has had more urinary leakage without stress and reports she is urinating every 2 hours. She denies dysuria, but her skin burns with every urination. This has caused dermatitis of her vulvar/perineal area. Denies hematuria, suprapubic pressure, flank pain. She is currently using nystatin cream and vaseline. She wears Depends when she is away from home and cotton underwear at home. Patient reports she has taken medication for incontinence in the past, but did not tolerate it.  Review of Systems:  Review of Systems  Eyes: Positive for blurred vision (wears corrective lenses).  Gastrointestinal: Positive for diarrhea (IBS).       + leaking stool  Genitourinary: Positive for frequency and urgency. Negative for dysuria, flank pain and hematuria.       + urinary incontenence + urinary urgency  Musculoskeletal: Positive for joint pain (right groin/hip). Negative for falls.  Skin: Positive for rash (irritant dermatitis vulvar).  Neurological: Positive for tremors.  Psychiatric/Behavioral: Negative for depression and memory loss.          Objective:     Vitals: BP 139/84   Pulse 60   Temp 98.8 F (37.1 C) (Oral)   Ht 5' 6.5" (1.689 m)   Wt 188 lb (85.3 kg)   BMI 29.89 kg/m   Body mass index is 29.89 kg/m.  Gen:  alert, not ill-appearing, no distress, appropriate for age 58: head normocephalic without obvious abnormality, conjunctiva and cornea clear, wearing glasses, trachea midline Pulm: Normal work of breathing, normal phonation, clear to auscultation bilaterally, no wheezes, rales or rhonchi CV: Normal rate, regular rhythm, s1 and s2 distinct, no murmurs, clicks or rubs  Neuro: alert and oriented x  3, left-sided resting upper extremity tremor MSK: extremities atraumatic, normal gait and station, ambulating with a cane Skin: intact, no rashes on exposed skin, no jaundice, no cyanosis Psych: well-groomed, cooperative, good eye contact, euthymic mood, affect mood-congruent, speech is articulate, and thought processes clear and goal-directed  Advanced Directives 05/01/2017 07/30/2016 07/04/2016 05/30/2015  Does Patient Have a Medical Advance Directive? No Yes No Yes  Type of Advance Directive - [place];Living will - -  Does patient want to make changes to medical advance directive? - - - No - Patient declined  Copy of [place] in Chart? - No - copy requested - -  Would patient like information on creating a medical advance directive? No - Patient declined - No - patient declined information -    Tobacco Social History   Tobacco Use  Smoking Status Never Smoker  Smokeless Tobacco Never Used     Counseling given: No   Past Medical History:  Diagnosis Date  . Anxiety   . Back pain, chronic   . Breast cancer ([place])    left  . 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 GERD (gastroesophageal reflux disease)   . Hemorrhoid   . HSV infection   . Hypertension   . 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 IBS (irritable bowel syndrome)   . Migraine   . Vertigo    Past Surgical History:  Procedure Laterality Date  . ABDOMINAL HYSTERECTOMY    . BREAST SURGERY    . HERNIA REPAIR    . KNEE ARTHROSCOPY Left   . LUMBAR DISC  SURGERY    . MASTECTOMY Left   . TONSILLECTOMY    . TUBAL LIGATION     Family History  Problem Relation Age of Onset  . Hypertension Mother   . Stroke Father    Social History   Socioeconomic History  . Marital status: Divorced    Spouse name: None  . Number of children: 1  . Years of education: None  . Highest education level: None  Social Needs  . Financial resource strain: None   . Food insecurity - worry: None  . Food insecurity - inability: None  . Transportation needs - medical: None  . Transportation needs - non-medical: None  Occupational History  . None  Tobacco Use  . Smoking status: Never Smoker  . Smokeless tobacco: Never Used  Substance and Sexual Activity  . Alcohol use: No    Alcohol/week: 0.0 oz  . Drug use: No  . Sexual activity: Not Currently  Other Topics Concern  . None  Social History Narrative  . None    Outpatient Encounter Medications as of 12/19/2017  Medication Sig  . acetaminophen (TYLENOL) 650 MG CR tablet Take 1 tablet (650 mg total) by mouth every 8 (eight) hours as needed for pain.  Marland Kitchen ALPRAZolam (XANAX) 0.25 MG tablet Take 1 tablet (0.25 mg total) by mouth at bedtime as needed for anxiety.  . AMBULATORY NON FORMULARY MEDICATION Knee-high, medium compression, graduated compression stockings. Apply to lower extremities. [email or web address], Zippered Compression Stockings, medium circ, long length  . AMBULATORY NON FORMULARY MEDICATION Rolling 4-point walker  . amLODipine (NORVASC) 5 MG tablet Take 5 mg by mouth daily.  . clobetasol cream (TEMOVATE) 4.85 % Apply 1 application topically 2 (two) times daily.  . diphenoxylate-atropine (LOMOTIL) 2.5-0.025 MG tablet TAKE ONE TABLET BY MOUTH EVERY DAY AS NEEDED FOR DIARRHEA OR LOOSE STOOLS  . lidocaine (XYLOCAINE) 5 % ointment Apply 1 application as needed topically.  Marland Kitchen lisinopril (PRINIVIL,ZESTRIL) 40 MG tablet Take 1 tablet (40 mg total) by mouth daily.  Marland Kitchen loratadine (CLARITIN) 10 MG tablet Take 1 tablet (10 mg total) by mouth daily.  . magnesium oxide (MAG-OX) 400 MG tablet Take 2 tablets (800 mg total) by mouth at bedtime.  . Misc Natural Products (COSAMIN ASU ADVANCED FORMULA) CAPS Take 2 tablets by mouth 2 (two) times daily.  . Multiple Vitamin (MULTIVITAMIN) tablet Take 1 tablet by mouth daily.  . Nebivolol HCl (BYSTOLIC) 20 MG TABS Take 2 tablets (40 mg total) by mouth daily.   Marland Kitchen nystatin-triamcinolone ointment (MYCOLOG) Apply 1 application topically 2 (two) times daily.  Marland Kitchen omeprazole (PRILOSEC) 20 MG capsule TAKE ONE CAPSULE BY MOUTH EVERY DAY  . OVER THE COUNTER MEDICATION Take 1 capsule by mouth daily. "Mega Red"  . Polyethylene Glycol 400 (BLINK TEARS OP) Place 1 drop into both eyes as needed (for dry eyes).  . pramipexole (MIRAPEX) 0.125 MG tablet TAKE ONE TABLET BY MOUTH 3 TIMES A DAY  . sodium chloride (OCEAN) 0.65 % SOLN nasal spray Place 1 spray into both nostrils as needed for congestion.  . valACYclovir (VALTREX) 500 MG tablet TAKE ONE TABLET BY MOUTH 2 TIMES A DAY. MAY REFILL IF NEW OUTBREAK OCCURS.  . [DISCONTINUED] BYSTOLIC 20 MG TABS Take 2 tablets (40 mg total) by mouth daily.  . [DISCONTINUED] carbidopa-levodopa (SINEMET IR) 25-100 MG tablet Take by mouth.  . [DISCONTINUED] lisinopril (PRINIVIL,ZESTRIL) 40 MG tablet TAKE ONE TABLET BY MOUTH EVERY DAY  . [DISCONTINUED] meclizine (ANTIVERT) 25 MG tablet Take 1 tablet (25  mg total) by mouth 3 (three) times daily as needed for dizziness.  . mirabegron ER (MYRBETRIQ) 25 MG TB24 tablet Take 1 tablet (25 mg total) by mouth daily.  . Multiple Minerals-Vitamins (CALCIUM-MAGNESIUM-ZINC-D3 PO) Take 1 tablet by mouth 2 (two) times daily.  . [DISCONTINUED] Turmeric 500 MG TABS Take 1 tablet by mouth 2 (two) times daily. (Patient not taking: Reported on 12/19/2017)   No facility-administered encounter medications on file as of 12/19/2017.     Activities of Daily Living In your present state of health, do you have any difficulty performing the following activities: 12/19/2017  Hearing? N  Vision? Y  Difficulty concentrating or making decisions? N  Walking or climbing stairs? N  Dressing or bathing? N  Doing errands, shopping? N  Some recent data might be hidden    Patient Care Team: Trixie Dredge, PA-C as PCP - General (Physician Assistant) Silverio Decamp, MD as Consulting Physician  (Sports Medicine) Emily Filbert, MD as Consulting Physician (Obstetrics and Gynecology) Murriel Hopper, MD as Consulting Physician (Neurology) Janie Morning, MD as Consulting Physician (Gastroenterology)    Assessment:   This is a routine wellness examination for Nichola.    Exercise Activities and Dietary recommendations    Goals    None      Fall Risk Fall Risk  12/19/2017 09/25/2016  Falls in the past year? No No   Is the patient's home free of loose throw rugs in walkways, pet beds, electrical cords, etc?   no      Grab bars in the bathroom? no      Handrails on the stairs?   no      Adequate lighting?   yes  Timed Get Up and Go performed: yes, 25 seconds  Depression Screen PHQ 2/9 Scores 12/19/2017 07/31/2017 09/25/2016  PHQ - 2 Score 0 1 0  PHQ- 9 Score 5 - -     Cognitive Function     6CIT Screen 12/19/2017  What Year? 0 points  What month? 0 points  What time? 0 points  Count back from 20 0 points  Months in reverse 0 points  Repeat phrase 2 points  Total Score 2    Immunization History  Administered Date(s) Administered  . Influenza, High Dose Seasonal PF 09/23/2017  . Influenza,inj,Quad PF,6+ Mos 08/29/2015, 07/23/2016, 09/25/2016  . Tdap 12/19/2017  . Zoster 04/12/2015    Qualifies for Shingles Vaccine? Yes, declines Shingrix  Screening Tests Health Maintenance  Topic Date Due  . TETANUS/TDAP  08/10/1960  . DEXA SCAN  04/01/2018 (Originally 08/10/2006)  . PNA vac Low Risk Adult (1 of 2 - PCV13) 12/19/2018 (Originally 08/10/2006)  . INFLUENZA VACCINE  Completed    Cancer Screenings: Lung: Low Dose CT Chest recommended if Age 83-80 years, 30 pack-year currently smoking OR have quit w/in 15years. Patient does not qualify. Breast:  Up to date on Mammogram? Yes, 10/15/2017   Up to date of Bone Density/Dexa? No Colorectal: n/a     Plan:    I have personally reviewed and noted the following in the patient's chart:   . Medical and social  history . Use of alcohol, tobacco or illicit drugs  . Current medications and supplements . Functional ability and status . Nutritional status . Physical activity . Advanced directives . List of other physicians . Hospitalizations, surgeries, and ER visits in previous 12 months . Vitals . Screenings to include cognitive, depression, and falls . Referrals and appointments  In addition,  I have reviewed and discussed with patient certain preventive protocols, quality metrics, and best practice recommendations. A written personalized care plan for preventive services as well as general preventive health recommendations were provided to patient.      Encounter for Medicare annual wellness exam  Refused pneumococcal vaccination  Hypertension goal BP (blood pressure) < 140/90 - Plan: lisinopril (PRINIVIL,ZESTRIL) 40 MG tablet, Nebivolol HCl (BYSTOLIC) 20 MG TABS  Mixed stress and urge urinary incontinence - Plan: mirabegron ER (MYRBETRIQ) 25 MG TB24 tablet  Overactive bladder - Plan: mirabegron ER (MYRBETRIQ) 25 MG TB24 tablet  Leukocytes in urine - Plan: POCT Urinalysis Dipstick, Urine Culture  Microscopic hematuria - Plan: POCT Urinalysis Dipstick, Urine Culture  Screening for osteoporosis - Plan: DG Bone Density  Ambulates with cane  At moderate risk for fall  Need for Tdap vaccination - Plan: Tdap vaccine greater than or equal to 7yo IM   Urine culture pending. Given patient's history of candidal infections and mutliple antibiotic allergies, will not treat empirically for cystitis and await culture results.  Trixie Dredge, [place]  12/19/2017

## 2017-12-20 LAB — URINE CULTURE
MICRO NUMBER: 90078985
Result:: NO GROWTH
SPECIMEN QUALITY:: ADEQUATE

## 2017-12-21 NOTE — Progress Notes (Signed)
Negative urine culture. No evidence of UTI

## 2017-12-22 ENCOUNTER — Encounter: Payer: Self-pay | Admitting: Sports Medicine

## 2017-12-22 ENCOUNTER — Ambulatory Visit (INDEPENDENT_AMBULATORY_CARE_PROVIDER_SITE_OTHER): Payer: Medicare Other | Admitting: Sports Medicine

## 2017-12-22 ENCOUNTER — Ambulatory Visit (INDEPENDENT_AMBULATORY_CARE_PROVIDER_SITE_OTHER): Payer: Medicare Other

## 2017-12-22 DIAGNOSIS — M546 Pain in thoracic spine: Secondary | ICD-10-CM

## 2017-12-22 DIAGNOSIS — M25551 Pain in right hip: Secondary | ICD-10-CM

## 2017-12-22 DIAGNOSIS — G8929 Other chronic pain: Secondary | ICD-10-CM | POA: Insufficient documentation

## 2017-12-22 NOTE — Assessment & Plan Note (Signed)
Pain in the right hip and groin, x-rays were equivocal. I am really not able to reproduce her pain with her hip exam, pain mostly at the pubic symphysis, question a pelvic stress injury. MRI pelvis, she is already had x-rays that were negative. In the meantime hip rehab exercises given.

## 2017-12-22 NOTE — Progress Notes (Signed)
Subjective:    I'm seeing this patient as a consultation for: Nelson Chimes, PA-C  CC: Right groin pain  HPI: This is a pleasant 77 year old female, for 4 months now she has had pain that she localizes deep in the groin but medial to where the hip joint is.  Moderate, persistent, localized without radiation, hip and pelvic x-rays have been unremarkable recently.  She also complains of pain in her mid thoracic spine with radiation around the left and right flanks.  No bowel or bladder dysfunction, saddle numbness, constitutional symptoms, or trauma.  I reviewed the past medical history, family history, social history, surgical history, and allergies today and no changes were needed.  Please see the problem list section below in epic for further details.  Past Medical History: Past Medical History:  Diagnosis Date  . Anxiety   . Back pain, chronic   . Breast cancer ([place])    left  . 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 GERD (gastroesophageal reflux disease)   . Hemorrhoid   . HSV infection   . Hypertension   . 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 IBS (irritable bowel syndrome)   . Migraine   . Vertigo    Past Surgical History: Past Surgical History:  Procedure Laterality Date  . ABDOMINAL HYSTERECTOMY    . BREAST SURGERY    . HERNIA REPAIR    . KNEE ARTHROSCOPY Left   . LUMBAR DISC SURGERY    . MASTECTOMY Left   . TONSILLECTOMY    . TUBAL LIGATION     Social History: Social History   Socioeconomic History  . Marital status: Divorced    Spouse name: None  . Number of children: 1  . Years of education: None  . Highest education level: None  Social Needs  . Financial resource strain: None  . Food insecurity - worry: None  . Food insecurity - inability: None  . Transportation needs - medical: None  . Transportation needs - non-medical: None  Occupational History  . None  Tobacco Use  . Smoking status: Never Smoker    . Smokeless tobacco: Never Used  Substance and Sexual Activity  . Alcohol use: No    Alcohol/week: 0.0 oz  . Drug use: No  . Sexual activity: Not Currently  Other Topics Concern  . None  Social History Narrative  . None   Family History: Family History  Problem Relation Age of Onset  . Hypertension Mother   . Stroke Father    Allergies: Allergies  Allergen Reactions  . Cefuroxime Axetil Shortness Of Breath    Has had cephalexin (Keflex) without side effects  . Edarbi [Azilsartan] Other (See Comments)    Heart pounding, foot swelling  . Methylprednisolone Other (See Comments) and Hypertension    Raised blood pressure and sugar  . Nystop [Nystatin] Other (See Comments)    Burning skin and breathing problems  . Penicillins Other (See Comments)    Face broke out in lumps  . Red Dye Itching, Rash and Other (See Comments)    Fainting  . Tamiflu [Oseltamivir Phosphate] Diarrhea and Other (See Comments)    Fainting  . Verelan [Verapamil Hcl Er] Shortness Of Breath  . Zithromax [Azithromycin] Shortness Of Breath  . Carbidopa-Levodopa Itching and Other (See Comments)    Itchy ears, throat and tongue   . Doxazosin Mesylate Other (See Comments)    Fast pulse  . Hyoscyamine Other (See Comments)    Shakes, dizzy and headache  .  Influenza Vaccines Other (See Comments)    "head in a vice", nasal itching  . Other Other (See Comments)    Cardiolyte injection - burning through body  . Asa [Aspirin] Rash  . Atenolol Other (See Comments)    Unknown  . Avelox [Moxifloxacin] Other (See Comments)    Unknown  . Benicar [Olmesartan] Other (See Comments)    Unknown  . Bentyl [Dicyclomine Hcl] Other (See Comments)    Headache and high BP  . Benzonatate Other (See Comments)    Unknown  . Bisoprolol Other (See Comments)    Unknown  . Cardizem [Diltiazem Hcl] Other (See Comments)    Unknown  . Carvedilol Other (See Comments)    Unknown  . Cefdinir Other (See Comments)    Unknown   . Chlorthalidone Other (See Comments)    Unknown  . Ciprofloxacin Other (See Comments)    Heartburn  . Citalopram Other (See Comments)    Unknown  . Clonidine Derivatives Other (See Comments)    Unknown  . Diovan [Valsartan] Other (See Comments)    Unknown  . Doxycycline Other (See Comments)    Headache  . Erythromycin Other (See Comments)    Unknown  . Fioricet [Butalbital-Apap-Caffeine] Other (See Comments)    Headache and dizzy  . Flexeril [Cyclobenzaprine] Other (See Comments)    Unknown  . Furosemide Other (See Comments)    Unknown  . Hydralazine Other (See Comments)    Headache  . Hydroxyzine Other (See Comments)    nightmares  . Isosorbide Other (See Comments)    Unknown  . Lyrica [Pregabalin] Other (See Comments)    Unknown  . Macrobid [employer] Macro] Other (See Comments)    Unknown  . Mavik [Trandolapril] Other (See Comments)    Unknown  . Metronidazole Itching and Other (See Comments)    Dizzy  . Naproxen Other (See Comments)    Unknown  . Neo-Polycin Hc [Bacitra-Neomycin-Polymyxin-Hc] Itching  . Nexium [Esomeprazole Magnesium] Other (See Comments)    Headache  . Norflex [Orphenadrine Citrate] Other (See Comments)  . Oxybutynin Chloride Er Other (See Comments)    Unknown  . Paxil [Paroxetine Hcl] Other (See Comments)    Unknown  . Phenazopyridine Other (See Comments)    Unknown  . Prazosin Other (See Comments)    Unknown  . Prednisone Other (See Comments)    Unknown  . Promethazine Other (See Comments)    Unknown  . Prozac [Fluoxetine Hcl] Other (See Comments)    Unknown  . Spironolactone Other (See Comments)    Unknown  . Sulfur Other (See Comments)    Unknown  . Tizanidine Other (See Comments)    Unknown  . Valium [Diazepam] Other (See Comments)    "Too strong?"  . Zoloft [Sertraline Hcl] Other (See Comments)    Unknown   Medications: See med rec.  Review of Systems: No headache, visual changes, nausea, vomiting,  diarrhea, constipation, dizziness, abdominal pain, skin rash, fevers, chills, night sweats, weight loss, swollen lymph nodes, body aches, joint swelling, muscle aches, chest pain, shortness of breath, mood changes, visual or auditory hallucinations.   Objective:   General: Well Developed, well nourished, and in no acute distress.  Neuro:  Extra-ocular muscles intact, able to move all 4 extremities, sensation grossly intact.  Deep tendon reflexes tested were normal. Psych: Alert and oriented, mood congruent with affect. ENT:  Ears and nose appear unremarkable.  Hearing grossly normal. Neck: Unremarkable overall appearance, trachea midline.  No visible thyroid enlargement.  Eyes: Conjunctivae and lids appear unremarkable.  Pupils equal and round. Skin: Warm and dry, no rashes noted.  Cardiovascular: Pulses palpable, no extremity edema. Right hip: ROM IR: 40 degrees, unable to reproduce pain, ER: 60 Deg, Flexion: 120 Deg, Extension: 100 Deg, Abduction: 45 Deg, Adduction: 45 Deg Strength IR: 5/5, ER: 5/5, Flexion: 5/5, unable to reproduce pain, Extension: 5/5, Abduction: 5/5, Adduction: 5/5 Pelvic alignment unremarkable to inspection and palpation. Standing hip rotation and gait without trendelenburg / unsteadiness. Greater trochanter without tenderness to palpation. No tenderness over piriformis. No SI joint tenderness and normal minimal SI movement. Back Exam:  Inspection: Unremarkable  Motion: Flexion 45 deg, Extension 45 deg, Side Bending to 45 deg bilaterally,  Rotation to 45 deg bilaterally  SLR laying: Negative  XSLR laying: Negative  Palpable tenderness: Tender to palpation in the mid thoracic spine. FABER: negative. Sensory change: Gross sensation intact to all lumbar and sacral dermatomes.  Reflexes: 2+ at both patellar tendons, 2+ at achilles tendons, Babinski's downgoing.  Strength at foot  Plantar-flexion: 5/5 Dorsi-flexion: 5/5 Eversion: 5/5 Inversion: 5/5  Leg strength   Quad: 5/5 Hamstring: 5/5 Hip flexor: 5/5 Hip abductors: 5/5  Gait unremarkable.  Impression and Recommendations:   This case required medical decision making of moderate complexity.  Right hip pain Pain in the right hip and groin, x-rays were equivocal. I am really not able to reproduce her pain with her hip exam, pain mostly at the pubic symphysis, question a pelvic stress injury. MRI pelvis, she is already had x-rays that were negative. In the meantime hip rehab exercises given.  Thoracic back pain She does get some radiation around her flanks. Tells me she is been having some heartburn as well. I will x-ray her thoracic spine but I do think this is abdominal and going to end up being a primary care issue. ___________________________________________ Gwen Her. Dianah Field, M.D., ABFM., CAQSM. Primary Care and [place] [employer] [facility]

## 2017-12-22 NOTE — Assessment & Plan Note (Signed)
She does get some radiation around her flanks. Tells me she is been having some heartburn as well. I will x-ray her thoracic spine but I do think this is abdominal and going to end up being a primary care issue.

## 2017-12-29 ENCOUNTER — Ambulatory Visit (INDEPENDENT_AMBULATORY_CARE_PROVIDER_SITE_OTHER): Payer: Medicare Other

## 2017-12-29 DIAGNOSIS — M25551 Pain in right hip: Secondary | ICD-10-CM

## 2017-12-29 DIAGNOSIS — M25451 Effusion, right hip: Secondary | ICD-10-CM

## 2017-12-31 ENCOUNTER — Ambulatory Visit: Payer: Medicare Other | Admitting: Sports Medicine

## 2018-01-01 ENCOUNTER — Ambulatory Visit (INDEPENDENT_AMBULATORY_CARE_PROVIDER_SITE_OTHER): Payer: Medicare Other | Admitting: Sports Medicine

## 2018-01-01 ENCOUNTER — Encounter: Payer: Self-pay | Admitting: Sports Medicine

## 2018-01-01 DIAGNOSIS — M25551 Pain in right hip: Secondary | ICD-10-CM

## 2018-01-01 NOTE — Assessment & Plan Note (Signed)
MRI did show nonspecific right hip joint effusion. Hip joint injection today. Return in 1 month.

## 2018-01-01 NOTE — Progress Notes (Signed)
Subjective:    CC: Right hip pain  HPI: Kemiya returns, we have been treating her right hip and pelvic pain, initially I suspected a stress injury, MRI showed nonspecific right hip joint effusion consistent with the location of where her pain was.  She is here today for further evaluation and definitive treatment, pain is moderate, persistent, localized in the groin without radiation.  I reviewed the past medical history, family history, social history, surgical history, and allergies today and no changes were needed.  Please see the problem list section below in epic for further details.  Past Medical History: Past Medical History:  Diagnosis Date  . Anxiety   . Back pain, chronic   . Breast cancer ([place])    left  . 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 GERD (gastroesophageal reflux disease)   . Hemorrhoid   . HSV infection   . Hypertension   . 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 IBS (irritable bowel syndrome)   . Migraine   . Vertigo    Past Surgical History: Past Surgical History:  Procedure Laterality Date  . ABDOMINAL HYSTERECTOMY    . BREAST SURGERY    . HERNIA REPAIR    . KNEE ARTHROSCOPY Left   . LUMBAR DISC SURGERY    . MASTECTOMY Left   . TONSILLECTOMY    . TUBAL LIGATION     Social History: Social History   Socioeconomic History  . Marital status: Divorced    Spouse name: None  . Number of children: 1  . Years of education: None  . Highest education level: None  Social Needs  . Financial resource strain: None  . Food insecurity - worry: None  . Food insecurity - inability: None  . Transportation needs - medical: None  . Transportation needs - non-medical: None  Occupational History  . None  Tobacco Use  . Smoking status: Never Smoker  . Smokeless tobacco: Never Used  Substance and Sexual Activity  . Alcohol use: No    Alcohol/week: 0.0 oz  . Drug use: No  . Sexual activity: Not Currently  Other Topics  Concern  . None  Social History Narrative  . None   Family History: Family History  Problem Relation Age of Onset  . Hypertension Mother   . Stroke Father    Allergies: Allergies  Allergen Reactions  . Cefuroxime Axetil Shortness Of Breath    Has had cephalexin (Keflex) without side effects  . Edarbi [Azilsartan] Other (See Comments)    Heart pounding, foot swelling  . Methylprednisolone Other (See Comments) and Hypertension    Raised blood pressure and sugar  . Nystop [Nystatin] Other (See Comments)    Burning skin and breathing problems  . Penicillins Other (See Comments)    Face broke out in lumps  . Red Dye Itching, Rash and Other (See Comments)    Fainting  . Tamiflu [Oseltamivir Phosphate] Diarrhea and Other (See Comments)    Fainting  . Verelan [Verapamil Hcl Er] Shortness Of Breath  . Zithromax [Azithromycin] Shortness Of Breath  . Carbidopa-Levodopa Itching and Other (See Comments)    Itchy ears, throat and tongue   . Doxazosin Mesylate Other (See Comments)    Fast pulse  . Hyoscyamine Other (See Comments)    Shakes, dizzy and headache  . Influenza Vaccines Other (See Comments)    "head in a vice", nasal itching  . Other Other (See Comments)    Cardiolyte injection - burning through body  . Asa [Aspirin]  Rash  . Atenolol Other (See Comments)    Unknown  . Avelox [Moxifloxacin] Other (See Comments)    Unknown  . Benicar [Olmesartan] Other (See Comments)    Unknown  . Bentyl [Dicyclomine Hcl] Other (See Comments)    Headache and high BP  . Benzonatate Other (See Comments)    Unknown  . Bisoprolol Other (See Comments)    Unknown  . Cardizem [Diltiazem Hcl] Other (See Comments)    Unknown  . Carvedilol Other (See Comments)    Unknown  . Cefdinir Other (See Comments)    Unknown  . Chlorthalidone Other (See Comments)    Unknown  . Ciprofloxacin Other (See Comments)    Heartburn  . Citalopram Other (See Comments)    Unknown  . Clonidine Derivatives  Other (See Comments)    Unknown  . Diovan [Valsartan] Other (See Comments)    Unknown  . Doxycycline Other (See Comments)    Headache  . Erythromycin Other (See Comments)    Unknown  . Fioricet [Butalbital-Apap-Caffeine] Other (See Comments)    Headache and dizzy  . Flexeril [Cyclobenzaprine] Other (See Comments)    Unknown  . Furosemide Other (See Comments)    Unknown  . Hydralazine Other (See Comments)    Headache  . Hydroxyzine Other (See Comments)    nightmares  . Isosorbide Other (See Comments)    Unknown  . Lyrica [Pregabalin] Other (See Comments)    Unknown  . Macrobid [employer] Macro] Other (See Comments)    Unknown  . Mavik [Trandolapril] Other (See Comments)    Unknown  . Metronidazole Itching and Other (See Comments)    Dizzy  . Naproxen Other (See Comments)    Unknown  . Neo-Polycin Hc [Bacitra-Neomycin-Polymyxin-Hc] Itching  . Nexium [Esomeprazole Magnesium] Other (See Comments)    Headache  . Norflex [Orphenadrine Citrate] Other (See Comments)  . Oxybutynin Chloride Er Other (See Comments)    Unknown  . Paxil [Paroxetine Hcl] Other (See Comments)    Unknown  . Phenazopyridine Other (See Comments)    Unknown  . Prazosin Other (See Comments)    Unknown  . Prednisone Other (See Comments)    Unknown  . Promethazine Other (See Comments)    Unknown  . Prozac [Fluoxetine Hcl] Other (See Comments)    Unknown  . Spironolactone Other (See Comments)    Unknown  . Sulfur Other (See Comments)    Unknown  . Tizanidine Other (See Comments)    Unknown  . Valium [Diazepam] Other (See Comments)    "Too strong?"  . Zoloft [Sertraline Hcl] Other (See Comments)    Unknown   Medications: See med rec.  Review of Systems: No fevers, chills, night sweats, weight loss, chest pain, or shortness of breath.   Objective:    General: Well Developed, well nourished, and in no acute distress.  Neuro: Alert and oriented x3, extra-ocular muscles intact,  sensation grossly intact.  HEENT: Normocephalic, atraumatic, pupils equal round reactive to light, neck supple, no masses, no lymphadenopathy, thyroid nonpalpable.  Skin: Warm and dry, no rashes. Cardiac: Regular rate and rhythm, no murmurs rubs or gallops, no lower extremity edema.  Respiratory: Clear to auscultation bilaterally. Not using accessory muscles, speaking in full sentences.  Procedure: Real-time Ultrasound Guided Injection of right hip joint Device: GE Logiq E  Verbal informed consent obtained.  Time-out conducted.  Noted no overlying erythema, induration, or other signs of local infection.  Skin prepped in a sterile fashion.  Local anesthesia: Topical  Ethyl chloride.  With sterile technique and under real time ultrasound guidance: Using a 22-gauge spinal needle advanced to the femoral head/neck junction, I then injected 1 cc kenalog 40, 2 cc lidocaine, 2 cc bupivacaine. Completed without difficulty  Pain immediately resolved suggesting accurate placement of the medication.  Advised to call if fevers/chills, erythema, induration, drainage, or persistent bleeding.  Images permanently stored and available for review in the ultrasound unit.  Impression: Technically successful ultrasound guided injection.  MRI personally reviewed, nonspecific right hip joint effusion, there is no abnormal T2 signal at any location in the pelvic bone.  Impression and Recommendations:    Right hip pain MRI did show nonspecific right hip joint effusion. Hip joint injection today. Return in 1 month.  ___________________________________________ Gwen Her. Dianah Field, M.D., ABFM., CAQSM. Primary Care and [place] Instructor of [place] [facility]

## 2018-01-02 ENCOUNTER — Telehealth: Payer: Self-pay

## 2018-01-02 NOTE — Telephone Encounter (Signed)
Hallelujah!

## 2018-01-02 NOTE — Telephone Encounter (Signed)
Naiyana called to tell Dr Dianah Field thank you for the injection. The pain is much better today. She feels she is finally able to walk.

## 2018-01-07 ENCOUNTER — Ambulatory Visit: Payer: Medicare Other | Admitting: Physician Assistant

## 2018-01-12 ENCOUNTER — Other Ambulatory Visit: Payer: Self-pay | Admitting: Physician Assistant

## 2018-01-26 ENCOUNTER — Other Ambulatory Visit: Payer: Self-pay

## 2018-01-26 ENCOUNTER — Other Ambulatory Visit: Payer: Self-pay | Admitting: Cardiology

## 2018-01-26 DIAGNOSIS — I10 Essential (primary) hypertension: Secondary | ICD-10-CM

## 2018-01-26 MED ORDER — NEBIVOLOL HCL 20 MG PO TABS: ORAL_TABLET | ORAL | 5 refills | Status: AC

## 2018-01-29 ENCOUNTER — Encounter: Payer: Self-pay | Admitting: Sports Medicine

## 2018-01-29 ENCOUNTER — Ambulatory Visit (INDEPENDENT_AMBULATORY_CARE_PROVIDER_SITE_OTHER): Payer: Medicare Other | Admitting: Sports Medicine

## 2018-01-29 DIAGNOSIS — M4722 Other spondylosis with radiculopathy, cervical region: Secondary | ICD-10-CM | POA: Diagnosis not present

## 2018-01-29 DIAGNOSIS — G20 Parkinson's disease: Secondary | ICD-10-CM | POA: Diagnosis not present

## 2018-01-29 DIAGNOSIS — M25551 Pain in right hip: Secondary | ICD-10-CM

## 2018-01-29 DIAGNOSIS — G20C Parkinsonism, unspecified: Secondary | ICD-10-CM

## 2018-01-29 MED ORDER — ROPINIROLE HCL 0.25 MG PO TABS: 0.2500 mg | ORAL_TABLET | Freq: Three times a day (TID) | ORAL | 0 refills | Status: DC

## 2018-01-29 NOTE — Assessment & Plan Note (Signed)
MRI showed a nonspecific right hip joint effusion. We did a hip joint injection she returns pain-free today. Return as needed for this.

## 2018-01-29 NOTE — Progress Notes (Signed)
Subjective:    CC: Follow-up  HPI: Sabah returns, she finally let us do a right hip joint injection at the last visit, she had a nonspecific right hip joint effusion on MRI, she returns today with complete pain relief on the right from an anterior aspect.  Tremor: Starting to get some adverse effects with her Mirapex, would like to switch to something else.  I reviewed the past medical history, family history, social history, surgical history, and allergies today and no changes were needed.  Please see the problem list section below in epic for further details.  Past Medical History: Past Medical History:  Diagnosis Date  . Anxiety   . Back pain, chronic   . Breast cancer ([place])    left  . 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 GERD (gastroesophageal reflux disease)   . Hemorrhoid   . HSV infection   . Hypertension   . 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 IBS (irritable bowel syndrome)   . Migraine   . Vertigo    Past Surgical History: Past Surgical History:  Procedure Laterality Date  . ABDOMINAL HYSTERECTOMY    . BREAST SURGERY    . HERNIA REPAIR    . KNEE ARTHROSCOPY Left   . LUMBAR DISC SURGERY    . MASTECTOMY Left   . TONSILLECTOMY    . TUBAL LIGATION     Social History: Social History   Socioeconomic History  . Marital status: Divorced    Spouse name: None  . Number of children: 1  . Years of education: None  . Highest education level: None  Social Needs  . Financial resource strain: None  . Food insecurity - worry: None  . Food insecurity - inability: None  . Transportation needs - medical: None  . Transportation needs - non-medical: None  Occupational History  . None  Tobacco Use  . Smoking status: Never Smoker  . Smokeless tobacco: Never Used  Substance and Sexual Activity  . Alcohol use: No    Alcohol/week: 0.0 oz  . Drug use: No  . Sexual activity: Not Currently  Other Topics Concern  . None  Social  History Narrative  . None   Family History: Family History  Problem Relation Age of Onset  . Hypertension Mother   . Stroke Father    Allergies: Allergies  Allergen Reactions  . Cefuroxime Axetil Shortness Of Breath    Has had cephalexin (Keflex) without side effects  . Edarbi [Azilsartan] Other (See Comments)    Heart pounding, foot swelling  . Methylprednisolone Other (See Comments) and Hypertension    Raised blood pressure and sugar  . Nystop [Nystatin] Other (See Comments)    Burning skin and breathing problems  . Penicillins Other (See Comments)    Face broke out in lumps  . Red Dye Itching, Rash and Other (See Comments)    Fainting  . Tamiflu [Oseltamivir Phosphate] Diarrhea and Other (See Comments)    Fainting  . Verelan [Verapamil Hcl Er] Shortness Of Breath  . Zithromax [Azithromycin] Shortness Of Breath  . Carbidopa-Levodopa Itching and Other (See Comments)    Itchy ears, throat and tongue   . Doxazosin Mesylate Other (See Comments)    Fast pulse  . Hyoscyamine Other (See Comments)    Shakes, dizzy and headache  . Influenza Vaccines Other (See Comments)    "head in a vice", nasal itching  . Other Other (See Comments)    Cardiolyte injection - burning through body  . Diona Fanti [  Aspirin] Rash  . Atenolol Other (See Comments)    Unknown  . Avelox [Moxifloxacin] Other (See Comments)    Unknown  . Benicar [Olmesartan] Other (See Comments)    Unknown  . Bentyl [Dicyclomine Hcl] Other (See Comments)    Headache and high BP  . Benzonatate Other (See Comments)    Unknown  . Bisoprolol Other (See Comments)    Unknown  . Cardizem [Diltiazem Hcl] Other (See Comments)    Unknown  . Carvedilol Other (See Comments)    Unknown  . Cefdinir Other (See Comments)    Unknown  . Chlorthalidone Other (See Comments)    Unknown  . Ciprofloxacin Other (See Comments)    Heartburn  . Citalopram Other (See Comments)    Unknown  . Clonidine Derivatives Other (See Comments)     Unknown  . Diovan [Valsartan] Other (See Comments)    Unknown  . Doxycycline Other (See Comments)    Headache  . Erythromycin Other (See Comments)    Unknown  . Fioricet [Butalbital-Apap-Caffeine] Other (See Comments)    Headache and dizzy  . Flexeril [Cyclobenzaprine] Other (See Comments)    Unknown  . Furosemide Other (See Comments)    Unknown  . Hydralazine Other (See Comments)    Headache  . Hydroxyzine Other (See Comments)    nightmares  . Isosorbide Other (See Comments)    Unknown  . Lyrica [Pregabalin] Other (See Comments)    Unknown  . Macrobid [employer] Macro] Other (See Comments)    Unknown  . Mavik [Trandolapril] Other (See Comments)    Unknown  . Metronidazole Itching and Other (See Comments)    Dizzy  . Naproxen Other (See Comments)    Unknown  . Neo-Polycin Hc [Bacitra-Neomycin-Polymyxin-Hc] Itching  . Nexium [Esomeprazole Magnesium] Other (See Comments)    Headache  . Norflex [Orphenadrine Citrate] Other (See Comments)  . Oxybutynin Chloride Er Other (See Comments)    Unknown  . Paxil [Paroxetine Hcl] Other (See Comments)    Unknown  . Phenazopyridine Other (See Comments)    Unknown  . Prazosin Other (See Comments)    Unknown  . Prednisone Other (See Comments)    Unknown  . Promethazine Other (See Comments)    Unknown  . Prozac [Fluoxetine Hcl] Other (See Comments)    Unknown  . Spironolactone Other (See Comments)    Unknown  . Sulfur Other (See Comments)    Unknown  . Tizanidine Other (See Comments)    Unknown  . Valium [Diazepam] Other (See Comments)    "Too strong?"  . Zoloft [Sertraline Hcl] Other (See Comments)    Unknown   Medications: See med rec.  Review of Systems: No fevers, chills, night sweats, weight loss, chest pain, or shortness of breath.   Objective:    General: Well Developed, well nourished, and in no acute distress.  Neuro: Alert and oriented x3, extra-ocular muscles intact, sensation grossly intact.    HEENT: Normocephalic, atraumatic, pupils equal round reactive to light, neck supple, no masses, no lymphadenopathy, thyroid nonpalpable.  Skin: Warm and dry, no rashes. Cardiac: Regular rate and rhythm, no murmurs rubs or gallops, no lower extremity edema.  Respiratory: Clear to auscultation bilaterally. Not using accessory muscles, speaking in full sentences.  Impression and Recommendations:    Right hip pain MRI showed a nonspecific right hip joint effusion. We did a hip joint injection she returns pain-free today. Return as needed for this.  Cervical spondylosis Worsening right-sided periscapular radicular pain. 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 Proceeding with a right C6-C7 interlaminar epidural.  Parkinsonian tremor (HCC) Would like to discontinue Mirapex, switching to Requip.  I spent 25 minutes with this patient, greater than 50% was face-to-face time counseling regarding the above diagnoses ___________________________________________ Gwen Her. Dianah Field, M.D., ABFM., CAQSM. Primary Care and [place] Instructor of [place] of [facility] Medicine

## 2018-01-29 NOTE — Assessment & Plan Note (Signed)
Would like to discontinue Mirapex, switching to Requip.

## 2018-01-29 NOTE — Assessment & Plan Note (Signed)
Worsening right-sided periscapular radicular pain. 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 Proceeding with a right C6-C7 interlaminar epidural.

## 2018-02-01 ENCOUNTER — Other Ambulatory Visit: Payer: Self-pay | Admitting: Physician Assistant

## 2018-02-01 DIAGNOSIS — K580 Irritable bowel syndrome with diarrhea: Secondary | ICD-10-CM

## 2018-02-01 DIAGNOSIS — F411 Generalized anxiety disorder: Secondary | ICD-10-CM

## 2018-02-02 ENCOUNTER — Telehealth: Payer: Self-pay

## 2018-02-02 NOTE — Telephone Encounter (Signed)
Pt left VM stating she's not going to go through with injections and wanted you to know.

## 2018-02-02 NOTE — Telephone Encounter (Signed)
Ok to fill 

## 2018-02-04 ENCOUNTER — Other Ambulatory Visit: Payer: Medicare Other

## 2018-02-05 ENCOUNTER — Other Ambulatory Visit: Payer: Self-pay | Admitting: Cardiology

## 2018-02-05 NOTE — Telephone Encounter (Signed)
REFILL 

## 2018-02-06 ENCOUNTER — Telehealth: Payer: Self-pay

## 2018-02-06 NOTE — Telephone Encounter (Signed)
Claritin and one half of a Benadryl with doses.

## 2018-02-06 NOTE — Telephone Encounter (Signed)
Pt left VM stating she is having worse side effects with new medication and she's only taking a half of a pill. States she's having itching that's so bad it keeps her up at night and scaly skin. Please advise.

## 2018-02-09 NOTE — Telephone Encounter (Signed)
Pt.notified

## 2018-02-19 ENCOUNTER — Other Ambulatory Visit: Payer: Self-pay | Admitting: Physician Assistant

## 2018-02-19 DIAGNOSIS — B009 Herpesviral infection, unspecified: Secondary | ICD-10-CM

## 2018-02-23 ENCOUNTER — Telehealth: Payer: Self-pay

## 2018-02-23 NOTE — Telephone Encounter (Signed)
Pt notified of recommendations -EH/RMA  

## 2018-02-23 NOTE — Telephone Encounter (Signed)
[name] that she had an ultrasound of her legs that showed the cause of her swelling. She has something called venous insufficiency, which means blood is pooling in her lower legs. This is not worrisome, but it is a chronic problem and it can lead to an itchy, red rash called venous stasis dermatitis. She has had intolerance to multiple medications for her tremors. I do not believe the Requip is the cause of this problem. If she really wants to stop it, she won't be able to treat her tremor. I would encourage her to keep doing the Requip.  Additional recommendations Take regular walks - Leg muscle activity promotes better venous return Avoid prolonged standing in one place Elevate Legs above Heart  Perform 30 minutes each 3-4 times daily  Elevate the foot of the bed to raise legs overnight Continue wearing compression stockings as much as possible  If the swelling is getting worse or she has a rash, I would want her to come in to see if she needs diuretics or an unna boot

## 2018-02-23 NOTE — Telephone Encounter (Signed)
Pt stated that she is having swelling in her legs, feet, and ankles.  She said she believes it's from taking Requip.  She is only taking 1/2 tablet TID.  She said it doesn't matter if she wares her compression stockings or not her LE are always swollen, red, and dry.  She is wanting to know could her meds be changed.  Pt notified that this will be sent to [facility] for review and will be contacted with recommendations. -EH/RMA

## 2018-02-27 ENCOUNTER — Other Ambulatory Visit: Payer: Self-pay

## 2018-02-27 DIAGNOSIS — B009 Herpesviral infection, unspecified: Secondary | ICD-10-CM

## 2018-02-27 MED ORDER — VALACYCLOVIR HCL 500 MG PO TABS: 500.0000 mg | ORAL_TABLET | Freq: Two times a day (BID) | ORAL | 0 refills | Status: DC

## 2018-03-04 ENCOUNTER — Ambulatory Visit (INDEPENDENT_AMBULATORY_CARE_PROVIDER_SITE_OTHER): Payer: Medicare Other | Admitting: Physician Assistant

## 2018-03-04 ENCOUNTER — Encounter: Payer: Self-pay | Admitting: Physician Assistant

## 2018-03-04 VITALS — BP 171/90 | HR 71 | Wt 189.0 lb

## 2018-03-04 DIAGNOSIS — I872 Venous insufficiency (chronic) (peripheral): Secondary | ICD-10-CM

## 2018-03-04 DIAGNOSIS — B351 Tinea unguium: Secondary | ICD-10-CM

## 2018-03-04 DIAGNOSIS — K580 Irritable bowel syndrome with diarrhea: Secondary | ICD-10-CM

## 2018-03-04 DIAGNOSIS — M150 Primary generalized (osteo)arthritis: Secondary | ICD-10-CM | POA: Diagnosis not present

## 2018-03-04 DIAGNOSIS — R300 Dysuria: Secondary | ICD-10-CM | POA: Diagnosis not present

## 2018-03-04 DIAGNOSIS — B009 Herpesviral infection, unspecified: Secondary | ICD-10-CM | POA: Diagnosis not present

## 2018-03-04 DIAGNOSIS — I10 Essential (primary) hypertension: Secondary | ICD-10-CM

## 2018-03-04 DIAGNOSIS — Z9181 History of falling: Secondary | ICD-10-CM

## 2018-03-04 DIAGNOSIS — B353 Tinea pedis: Secondary | ICD-10-CM | POA: Diagnosis not present

## 2018-03-04 DIAGNOSIS — W57XXXS Bitten or stung by nonvenomous insect and other nonvenomous arthropods, sequela: Secondary | ICD-10-CM

## 2018-03-04 DIAGNOSIS — M8949 Other hypertrophic osteoarthropathy, multiple sites: Secondary | ICD-10-CM

## 2018-03-04 DIAGNOSIS — M159 Polyosteoarthritis, unspecified: Secondary | ICD-10-CM

## 2018-03-04 DIAGNOSIS — R82998 Other abnormal findings in urine: Secondary | ICD-10-CM

## 2018-03-04 DIAGNOSIS — R3129 Other microscopic hematuria: Secondary | ICD-10-CM

## 2018-03-04 DIAGNOSIS — Z79899 Other long term (current) drug therapy: Secondary | ICD-10-CM

## 2018-03-04 MED ORDER — VALACYCLOVIR HCL 500 MG PO TABS: 500.0000 mg | ORAL_TABLET | Freq: Every day | ORAL | 11 refills | Status: AC

## 2018-03-04 NOTE — Progress Notes (Signed)
HPI:                                                                Dana Calderon is a 77 y.o. female who presents to [facility] [place] today for multiple health concerns  1. Bug bite/rash: left inguinal/pannus area. improving, no pain, redness, or itching. Wants area rechecked today. "Tiny, round, pinhead sized bug."  2. 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 Most recent outbreak lasted 3 weeks. Not sexually active in more than 2 years.  3. Left eye stye - saw the eye doctor. Has been using dry eye drops and warm compresses. Could not tolerate prescription eye drop. Denies vision change, photophobia, diplopia, eye pain, or drainage.   4. Peripheral edema - chronic problem, worse on the right side. Negative dopplers last year. Followed by Sports Medicine. Venous reflux US showed bilateral insufficiency of common femoral veins. She has been wearing her compression stockings for a few hours per day, but does not feel they are helping.  5. HTN: hx of multiple medication intolerances. Currently on Bystolic, Lisinopril, and Amlodipine. She will occasionally half her doses due to side effects. Otherwise she is compliant with medications. Checks BP's at home. BP range 140's-160's/80's-90's. Denies vision change, headache, chest pain with exertion, orthopnea, lightheadedness, syncope. Risk factors include: age>65, fam hx           Past Medical History:  Diagnosis Date  . Anxiety   . Back pain, chronic   . Breast cancer ([place])    left  . 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 GERD (gastroesophageal reflux disease)   . Hemorrhoid   . HSV infection   . Hypertension   . 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 IBS (irritable bowel syndrome)   . Migraine   . Vertigo    Past Surgical History:  Procedure Laterality Date  . ABDOMINAL HYSTERECTOMY    . BREAST SURGERY     . HERNIA REPAIR    . KNEE ARTHROSCOPY Left   . LUMBAR DISC SURGERY    . MASTECTOMY Left   . TONSILLECTOMY    . TUBAL LIGATION     Social History   Tobacco Use  . Smoking status: Never Smoker  . Smokeless tobacco: Never Used  Substance Use Topics  . Alcohol use: No    Alcohol/week: 0.0 oz   family history includes Hypertension in her mother; Stroke in her father.    ROS: negative except as noted in the HPI  Medications: Current Outpatient Medications  Medication Sig Dispense Refill  . ALPRAZolam (XANAX) 0.25 MG tablet TAKE ONE TABLET BY MOUTH AT BEDTIME AS NEEDED FOR ANXIETY 30 tablet 2  . AMBULATORY NON FORMULARY MEDICATION Knee-high, medium compression, graduated compression stockings. Apply to lower extremities. [email or web address], Zippered Compression Stockings, medium circ, long length 1 each 0  . AMBULATORY NON FORMULARY MEDICATION Rolling 4-point walker 1 each 0  . amLODipine (NORVASC) 5 MG tablet TAKE ONE TABLET BY MOUTH EVERY DAY 90 tablet 1  . BOOSTRIX 5-2.5-18.5 LF-MCG/0.5 injection     . clobetasol cream (TEMOVATE) 5.05 % Apply 1 application topically 2 (two) times daily. 15 g 0  . diphenoxylate-atropine (LOMOTIL) 2.5-0.025 MG tablet  TAKE ONE TABLET BY MOUTH EVERY DAY AS NEEDED FOR DIARRHEA OR LOOSE STOOLS 30 tablet 5  . lidocaine (XYLOCAINE) 5 % ointment Apply 1 application as needed topically. 35.44 g 0  . lisinopril (PRINIVIL,ZESTRIL) 40 MG tablet Take 1 tablet (40 mg total) by mouth daily. 90 tablet 1  . loratadine (CLARITIN) 10 MG tablet Take 1 tablet (10 mg total) by mouth daily. 10 tablet 0  . magnesium oxide (MAG-OX) 400 MG tablet Take 2 tablets (800 mg total) by mouth at bedtime. 90 tablet 3  . mirabegron ER (MYRBETRIQ) 25 MG TB24 tablet Take 1 tablet (25 mg total) by mouth daily. 30 tablet 5  . Misc Natural Products (COSAMIN ASU ADVANCED FORMULA) CAPS Take 2 tablets by mouth 2 (two) times daily. 120 capsule 11  . Multiple Minerals-Vitamins  (CALCIUM-MAGNESIUM-ZINC-D3 PO) Take 1 tablet by mouth 2 (two) times daily.    . Multiple Vitamin (MULTIVITAMIN) tablet Take 1 tablet by mouth daily.    . Nebivolol HCl (BYSTOLIC) 20 MG TABS Take 2 tablets (40 mg total) by mouth daily. 60 tablet 5  . nystatin-triamcinolone ointment (MYCOLOG) Apply 1 application topically 2 (two) times daily. 30 g 0  . omeprazole (PRILOSEC) 20 MG capsule TAKE ONE CAPSULE BY MOUTH EVERY DAY 90 capsule 0  . OVER THE COUNTER MEDICATION Take 1 capsule by mouth daily. "Mega Red"    . Polyethylene Glycol 400 (BLINK TEARS OP) Place 1 drop into both eyes as needed (for dry eyes).    Marland Kitchen rOPINIRole (REQUIP) 0.25 MG tablet Take 1 tablet (0.25 mg total) by mouth 3 (three) times daily. 90 tablet 0  . sodium chloride (OCEAN) 0.65 % SOLN nasal spray Place 1 spray into both nostrils as needed for congestion. 15 mL 0  . valACYclovir (VALTREX) 500 MG tablet Take 1 tablet (500 mg total) by mouth 2 (two) times daily. 60 tablet 0   No current facility-administered medications for this visit.    Allergies  Allergen Reactions  . Cefuroxime Axetil Shortness Of Breath    Has had cephalexin (Keflex) without side effects  . Edarbi [Azilsartan] Other (See Comments)    Heart pounding, foot swelling  . Methylprednisolone Other (See Comments) and Hypertension    Raised blood pressure and sugar  . Nystop [Nystatin] Other (See Comments)    Burning skin and breathing problems  . Penicillins Other (See Comments)    Face broke out in lumps  . Red Dye Itching, Rash and Other (See Comments)    Fainting  . Tamiflu [Oseltamivir Phosphate] Diarrhea and Other (See Comments)    Fainting  . Verelan [Verapamil Hcl Er] Shortness Of Breath  . Zithromax [Azithromycin] Shortness Of Breath  . Carbidopa-Levodopa Itching and Other (See Comments)    Itchy ears, throat and tongue   . Doxazosin Mesylate Other (See Comments)    Fast pulse  . Hyoscyamine Other (See Comments)    Shakes, dizzy and headache   . Influenza Vaccines Other (See Comments)    "head in a vice", nasal itching  . Other Other (See Comments)    Cardiolyte injection - burning through body  . Asa [Aspirin] Rash  . Atenolol Other (See Comments)    Unknown  . Avelox [Moxifloxacin] Other (See Comments)    Unknown  . Benicar [Olmesartan] Other (See Comments)    Unknown  . Bentyl [Dicyclomine Hcl] Other (See Comments)    Headache and high BP  . Benzonatate Other (See Comments)    Unknown  . Bisoprolol Other (  See Comments)    Unknown  . Cardizem [Diltiazem Hcl] Other (See Comments)    Unknown  . Carvedilol Other (See Comments)    Unknown  . Cefdinir Other (See Comments)    Unknown  . Chlorthalidone Other (See Comments)    Unknown  . Ciprofloxacin Other (See Comments)    Heartburn  . Citalopram Other (See Comments)    Unknown  . Clonidine Derivatives Other (See Comments)    Unknown  . Diovan [Valsartan] Other (See Comments)    Unknown  . Doxycycline Other (See Comments)    Headache  . Erythromycin Other (See Comments)    Unknown  . Fioricet [Butalbital-Apap-Caffeine] Other (See Comments)    Headache and dizzy  . Flexeril [Cyclobenzaprine] Other (See Comments)    Unknown  . Furosemide Other (See Comments)    Unknown  . Hydralazine Other (See Comments)    Headache  . Hydroxyzine Other (See Comments)    nightmares  . Isosorbide Other (See Comments)    Unknown  . Lyrica [Pregabalin] Other (See Comments)    Unknown  . Macrobid [employer] Macro] Other (See Comments)    Unknown  . Mavik [Trandolapril] Other (See Comments)    Unknown  . Metronidazole Itching and Other (See Comments)    Dizzy  . Naproxen Other (See Comments)    Unknown  . Neo-Polycin Hc [Bacitra-Neomycin-Polymyxin-Hc] Itching  . Nexium [Esomeprazole Magnesium] Other (See Comments)    Headache  . Norflex [Orphenadrine Citrate] Other (See Comments)  . Oxybutynin Chloride Er Other (See Comments)    Unknown  . Paxil  [Paroxetine Hcl] Other (See Comments)    Unknown  . Phenazopyridine Other (See Comments)    Unknown  . Prazosin Other (See Comments)    Unknown  . Prednisone Other (See Comments)    Unknown  . Promethazine Other (See Comments)    Unknown  . Prozac [Fluoxetine Hcl] Other (See Comments)    Unknown  . Spironolactone Other (See Comments)    Unknown  . Sulfur Other (See Comments)    Unknown  . Tizanidine Other (See Comments)    Unknown  . Valium [Diazepam] Other (See Comments)    "Too strong?"  . Zoloft [Sertraline Hcl] Other (See Comments)    Unknown       Objective:  BP (!) 171/90   Pulse 71   Wt 189 lb (85.7 kg)   BMI 30.05 kg/m  Gen:  alert, not ill-appearing, no distress, appropriate for age, obese female HEENT: head normocephalic without obvious abnormality, conjunctiva and cornea clear, wearing glasses, trachea midline Pulm: Normal work of breathing, normal phonation, clear to auscultation bilaterally, no wheezes, rales or rhonchi CV: Normal rate, regular rhythm, s1 and s2 distinct, no murmurs, clicks or rubs  Neuro: alert and oriented x 3, no tremor MSK: extremities atraumatic, normal gait and station, 1+ peripheral edema R>L Skin: approx 1 mm erythematous lesion in the left inguinal/pannus region, no streaking redness or induration, intertriginous area with minimal redness Psych: well-groomed, cooperative, good eye contact, euthymic mood, affect mood-congruent, speech is articulate, and thought processes clear and goal-directed    No results found for this or any previous visit (from the past 72 hour(s)). No results found.    Assessment and Plan: 77 y.o. female with   1. Encounter for medication management - fasting labs UTD - mammogram UTD - she declines Pneumovax and Dexa scan  2. Recurrent HSV (herpes simplex virus) - given the number of outbreaks she is having per  year, I am going to start her on suppressive therapy. She tolerates Valtrex without side  effects   3. Dysuria - likely 2/2 HSV - Urinalysis, Routine w reflex microscopic - Urine Culture  4. Onychomycosis - follow-up with Podiatry  5. Tinea pedis of right foot - follow-up with Podiatry  6. Edema of both lower extremities due to peripheral venous insufficiency - explained etiology of peripheral venous insufficiency. We reviewed videos on proper application of compression stockings - counseled on elevating legs 3-4 times daily - counseled on regular walking  7. Hypertension goal BP (blood pressure) < 140/90 BP Readings from Last 3 Encounters:  03/04/18 (!) 171/90  01/29/18 (!) 163/84  01/01/18 (!) 163/83   Lab Results  Component Value Date   CREATININE 0.85 12/17/2017  - white coat syndrome in the setting of difficult to control hypertension due to medication intolerances. Patient understands increased risk of stroke/MI. Continue ACE, CCB, and Beta blocker   8. Leukocytes in urine - all urine culture to date have been negative. Likely sterile pyuria - Urinalysis, Routine w reflex microscopic - Urine Culture  9. Microscopic hematuria - CT abd/pelvis 11/2017 showed normal kidneys, ureters and bladder - Urinalysis, Routine w reflex microscopic - Urine Culture  10. At moderate risk for fall - counseled on fall prevention in the home. Counseled on bone health to prevent osteoporosis/hip fractures. Dexa declined  11. Bug bite, sequela - no evidence of secondary infection. Improving.   Patient education and anticipatory guidance given Patient agrees with treatment plan Follow-up every 2 months for medication management as needed if symptoms worsen or fail to improve  I spent 40 minutes with this patient, greater than 50% was face-to-face time counseling regarding the above diagnoses   Darlyne Russian PA-C

## 2018-03-04 NOTE — Patient Instructions (Addendum)
For leg swelling: - Take regular walks - Leg muscle activity promotes better venous return - Avoid prolonged standing in one place - Elevate Legs above Heart             Perform 30 minutes each 3-4 times daily             Elevate the foot of the bed to raise legs overnight - Continue wearing compression stockings as much as possible - If the swelling is getting worse or you develop a rash, come in to see if she needs diuretics or an unna boot   Chronic Venous Insufficiency Chronic venous insufficiency, also called venous stasis, is a condition that prevents blood from being pumped effectively through the veins in your legs. Blood may no longer be pumped effectively from the legs back to the heart. This condition can range from mild to severe. With proper treatment, you should be able to continue with an active life. What are the causes? Chronic venous insufficiency occurs when the vein walls become stretched, weakened, or damaged, or when valves within the vein are damaged. Some common causes of this include:  High blood pressure inside the veins (venous hypertension).  Increased blood pressure in the leg veins from long periods of sitting or standing.  A blood clot that blocks blood flow in a vein (deep vein thrombosis, DVT).  Inflammation of a vein (phlebitis) that causes a blood clot to form.  Tumors in the pelvis that cause blood to back up.  What increases the risk? The following factors may make you more likely to develop this condition:  Having a family history of this condition.  Obesity.  Pregnancy.  Living without enough physical activity or exercise (sedentary lifestyle).  Smoking.  Having a job that requires long periods of standing or sitting in one place.  Being a certain age. Women in their 79s and 67s and men in their 35s are more likely to develop this condition.  What are the signs or symptoms? Symptoms of this condition include:  Veins that are  enlarged, bulging, or twisted (varicose veins).  Skin breakdown or ulcers.  Reddened or discolored skin on the front of the leg.  Brown, smooth, tight, and painful skin just above the ankle, usually on the inside of the leg (lipodermatosclerosis).  Swelling.  How is this diagnosed? This condition may be diagnosed based on:  Your medical history.  A physical exam.  Tests, such as: ? A procedure that creates an image of a blood vessel and nearby organs and provides information about blood flow through the blood vessel (duplex ultrasound). ? A procedure that tests blood flow (plethysmography). ? A procedure to look at the veins using X-ray and dye (venogram).  How is this treated? The goals of treatment are to help you return to an active life and to minimize pain or disability. Treatment depends on the severity of your condition, and it may include:  Wearing compression stockings. These can help relieve symptoms and help prevent your condition from getting worse. However, they do not cure the condition.  Sclerotherapy. This is a procedure involving an injection of a material that "dissolves" damaged veins.  Surgery. This may involve: ? Removing a diseased vein (vein stripping). ? Cutting off blood flow through the vein (laser ablation surgery). ? Repairing a valve.  Follow these instructions at home:  Wear compression stockings as told by your health care provider. These stockings help to prevent blood clots and reduce swelling in your  legs.  Take over-the-counter and prescription medicines only as told by your health care provider.  Stay active by exercising, walking, or doing different activities. Ask your health care provider what activities are safe for you and how much exercise you need.  Drink enough fluid to keep your urine clear or pale yellow.  Do not use any products that contain nicotine or tobacco, such as cigarettes and e-cigarettes. If you need help quitting,  ask your health care provider.  Keep all follow-up visits as told by your health care provider. This is important. Contact a health care provider if:  You have redness, swelling, or more pain in the affected area.  You see a red streak or line that extends up or down from the affected area.  You have skin breakdown or a loss of skin in the affected area, even if the breakdown is small.  You get an injury in the affected area. Get help right away if:  You get an injury and an open wound in the affected area.  You have severe pain that does not get better with medicine.  You have sudden numbness or weakness in the foot or ankle below the affected area, or you have trouble moving your foot or ankle.  You have a fever and you have worse or persistent symptoms.  You have chest pain.  You have shortness of breath. Summary  Chronic venous insufficiency, also called venous stasis, is a condition that prevents blood from being pumped effectively through the veins in your legs.  Chronic venous insufficiency occurs when the vein walls become stretched, weakened, or damaged, or when valves within the vein are damaged.  Treatment for this condition depends on how severe your condition is, and it may involve wearing compression stockings or having a procedure.  Make sure you stay active by exercising, walking, or doing different activities. Ask your health care provider what activities are safe for you and how much exercise you need. This information is not intended to replace advice given to you by your health care provider. Make sure you discuss any questions you have with your health care provider. Document Released: 03/24/2007 Document Revised: 10/07/2016 Document Reviewed: 10/07/2016 Elsevier Interactive Patient Education  2017 [place] protect organs, store calcium, and anchor muscles. Good health habits, such as eating nutritious foods and exercising  regularly, are important for maintaining healthy bones. They can also help to prevent a condition that causes bones to lose density and become weak and brittle (osteoporosis). Why is bone mass important? Bone mass refers to the amount of bone tissue that you have. The higher your bone mass, the stronger your bones. An important step toward having healthy bones throughout life is to have strong and dense bones during childhood. A young adult who has a high bone mass is more likely to have a high bone mass later in life. Bone mass at its greatest it is called peak bone mass. A large decline in bone mass occurs in older adults. In women, it occurs about the time of menopause. During this time, it is important to practice good health habits, because if more bone is lost than what is replaced, the bones will become less healthy and more likely to break (fracture). If you find that you have a low bone mass, you may be able to prevent osteoporosis or further bone loss by changing your diet and lifestyle. How can I find out if my bone mass is low?  Bone mass can be measured with an X-ray test that is called a bone mineral density (BMD) test. This test is recommended for all women who are age 69 or older. It may also be recommended for men who are age 5 or older, or for people who are more likely to develop osteoporosis due to:  Having bones that break easily.  Having a long-term disease that weakens bones, such as kidney disease or rheumatoid arthritis.  Having menopause earlier than normal.  Taking medicine that weakens bones, such as steroids, thyroid hormones, or hormone treatment for breast cancer or prostate cancer.  Smoking.  Drinking three or more alcoholic drinks each day.  What are the nutritional recommendations for healthy bones? To have healthy bones, you need to get enough of the right minerals and vitamins. Most nutrition experts recommend getting these nutrients from the foods that you  eat. Nutritional recommendations vary from person to person. Ask your health care provider what is healthy for you. Here are some general guidelines. Calcium Recommendations Calcium is the most important (essential) mineral for bone health. Most people can get enough calcium from their diet, but supplements may be recommended for people who are at risk for osteoporosis. Good sources of calcium include:  Dairy products, such as low-fat or nonfat milk, cheese, and yogurt.  Dark green leafy vegetables, such as bok choy and broccoli.  Calcium-fortified foods, such as orange juice, cereal, bread, soy beverages, and tofu products.  Nuts, such as almonds.  Follow these recommended amounts for daily calcium intake:  Children, age 69?3: 700 mg.  Children, age 40?8: 1,000 mg.  Children, age 20?13: 1,300 mg.  Teens, age 693?18: 1,300 mg.  Adults, age 695?50: 1,000 mg.  Adults, age 36?70: ? Men: 1,000 mg. ? Women: 1,200 mg.  Adults, age 31 or older: 1,200 mg.  Pregnant and breastfeeding females: ? Teens: 1,300 mg. ? Adults: 1,000 mg.  Vitamin D Recommendations Vitamin D is the most essential vitamin for bone health. It helps the body to absorb calcium. Sunlight stimulates the skin to make vitamin D, so be sure to get enough sunlight. If you live in a cold climate or you do not get outside often, your health care provider may recommend that you take vitamin D supplements. Good sources of vitamin D in your diet include:  Egg yolks.  Saltwater fish.  Milk and cereal fortified with vitamin D.  Follow these recommended amounts for daily vitamin D intake:  Children and teens, age 22?18: 62 international units.  Adults, age 69 or younger: 400-800 international units.  Adults, age 74 or older: 800-1,000 international units.  Other Nutrients Other nutrients for bone health include:  Phosphorus. This mineral is found in meat, poultry, dairy foods, nuts, and legumes. The recommended daily  intake for adult men and adult women is 700 mg.  Magnesium. This mineral is found in seeds, nuts, dark green vegetables, and legumes. The recommended daily intake for adult men is 400?420 mg. For adult women, it is 310?320 mg.  Vitamin K. This vitamin is found in green leafy vegetables. The recommended daily intake is 120 mg for adult men and 90 mg for adult women.  What type of physical activity is best for building and maintaining healthy bones? Weight-bearing and strength-building activities are important for building and maintaining peak bone mass. Weight-bearing activities cause muscles and bones to work against gravity. Strength-building activities increases muscle strength that supports bones. Weight-bearing and muscle-building activities include:  Walking and hiking.  Jogging  and running.  Dancing.  Gym exercises.  Lifting weights.  Tennis and racquetball.  Climbing stairs.  Aerobics.  Adults should get at least 30 minutes of moderate physical activity on most days. Children should get at least 60 minutes of moderate physical activity on most days. Ask your health care provide what type of exercise is best for you. Where can I find more information? For more information, check out the following websites:  [place]: [employer]  [employer] of Health: http://www.niams.AnonymousEar.fr.asp  This information is not intended to replace advice given to you by your health care provider. Make sure you discuss any questions you have with your health care provider. Document Released: 02/08/2004 Document Revised: 06/07/2016 Document Reviewed: 11/23/2014 Elsevier Interactive Patient Education  [employer].

## 2018-03-05 LAB — URINALYSIS, ROUTINE W REFLEX MICROSCOPIC
BACTERIA UA: NONE SEEN /HPF
BILIRUBIN URINE: NEGATIVE
Glucose, UA: NEGATIVE
HGB URINE DIPSTICK: NEGATIVE
Hyaline Cast: NONE SEEN /LPF
KETONES UR: NEGATIVE
NITRITE: NEGATIVE
Protein, ur: NEGATIVE
RBC / HPF: NONE SEEN /HPF (ref 0–2)
SPECIFIC GRAVITY, URINE: 1.006 (ref 1.001–1.03)
Squamous Epithelial / LPF: NONE SEEN /HPF (ref <=5)
pH: 5.5 (ref 5.0–8.0)

## 2018-03-05 LAB — URINE CULTURE
MICRO NUMBER:: 90413176
RESULT: NO GROWTH
SPECIMEN QUALITY:: ADEQUATE

## 2018-03-06 ENCOUNTER — Encounter: Payer: Self-pay | Admitting: Physician Assistant

## 2018-03-06 DIAGNOSIS — R8281 Pyuria: Secondary | ICD-10-CM

## 2018-03-06 HISTORY — DX: Pyuria: R82.81

## 2018-03-06 NOTE — Progress Notes (Signed)
She has chronic leukocytes in her urine, but no infection. This is benign is likely just related to post-menopausal changes

## 2018-03-11 ENCOUNTER — Other Ambulatory Visit: Payer: Self-pay | Admitting: Sports Medicine

## 2018-03-11 ENCOUNTER — Telehealth: Payer: Self-pay

## 2018-03-11 DIAGNOSIS — G20 Parkinson's disease: Secondary | ICD-10-CM

## 2018-03-11 NOTE — Telephone Encounter (Signed)
Pt is c/o continued LLE swelling.  She stated that her left foot and ankle has continued to swell and is hard to bend to walk. Please advise. -EH/RMA

## 2018-03-11 NOTE — Telephone Encounter (Signed)
Dr. Darene Lamer is working on this. She needs to have her leg ultrasound performed so he can make the appropriate diagnosis. Then we may be able to refer her to Vascular specialist for treatment.  I re-printed the order req because it does not appear to have been scheduled.

## 2018-03-11 NOTE — Telephone Encounter (Signed)
To PCP

## 2018-03-11 NOTE — Telephone Encounter (Signed)
Order has been given to referral coordinator for scheduling.

## 2018-03-15 ENCOUNTER — Encounter: Payer: Self-pay | Admitting: Physician Assistant

## 2018-03-15 DIAGNOSIS — B353 Tinea pedis: Secondary | ICD-10-CM | POA: Insufficient documentation

## 2018-03-15 DIAGNOSIS — K449 Diaphragmatic hernia without obstruction or gangrene: Secondary | ICD-10-CM | POA: Insufficient documentation

## 2018-03-16 ENCOUNTER — Encounter: Payer: Self-pay | Admitting: Podiatry

## 2018-03-16 ENCOUNTER — Ambulatory Visit (INDEPENDENT_AMBULATORY_CARE_PROVIDER_SITE_OTHER): Payer: Medicare Other | Admitting: Podiatry

## 2018-03-16 ENCOUNTER — Encounter: Payer: Self-pay | Admitting: Physician Assistant

## 2018-03-16 ENCOUNTER — Ambulatory Visit (INDEPENDENT_AMBULATORY_CARE_PROVIDER_SITE_OTHER): Payer: Medicare Other | Admitting: Physician Assistant

## 2018-03-16 VITALS — BP 168/85 | HR 70 | Wt 187.0 lb

## 2018-03-16 DIAGNOSIS — G8929 Other chronic pain: Secondary | ICD-10-CM | POA: Diagnosis not present

## 2018-03-16 DIAGNOSIS — M4180 Other forms of scoliosis, site unspecified: Secondary | ICD-10-CM | POA: Diagnosis not present

## 2018-03-16 DIAGNOSIS — M546 Pain in thoracic spine: Secondary | ICD-10-CM

## 2018-03-16 DIAGNOSIS — M25551 Pain in right hip: Secondary | ICD-10-CM | POA: Diagnosis not present

## 2018-03-16 DIAGNOSIS — M79671 Pain in right foot: Secondary | ICD-10-CM | POA: Diagnosis not present

## 2018-03-16 DIAGNOSIS — R1012 Left upper quadrant pain: Secondary | ICD-10-CM | POA: Diagnosis not present

## 2018-03-16 DIAGNOSIS — M79672 Pain in left foot: Secondary | ICD-10-CM | POA: Diagnosis not present

## 2018-03-16 DIAGNOSIS — B351 Tinea unguium: Secondary | ICD-10-CM

## 2018-03-16 DIAGNOSIS — I872 Venous insufficiency (chronic) (peripheral): Secondary | ICD-10-CM | POA: Diagnosis not present

## 2018-03-16 DIAGNOSIS — R935 Abnormal findings on diagnostic imaging of other abdominal regions, including retroperitoneum: Secondary | ICD-10-CM

## 2018-03-16 DIAGNOSIS — Z9181 History of falling: Secondary | ICD-10-CM | POA: Diagnosis not present

## 2018-03-16 DIAGNOSIS — Z9989 Dependence on other enabling machines and devices: Secondary | ICD-10-CM

## 2018-03-16 DIAGNOSIS — G20 Parkinson's disease: Secondary | ICD-10-CM | POA: Diagnosis not present

## 2018-03-16 NOTE — Progress Notes (Signed)
HPI:                                                                Dana Calderon is a 77 y.o. female who presents to [facility] [place] today for back pain  Back Pain  This is a recurrent problem. The current episode started in the past 7 days. The problem occurs constantly. The problem has been gradually worsening since onset. The pain is present in the thoracic spine. The quality of the pain is described as aching. Radiates to: left flank. The pain is moderate. The pain is the same all the time. The symptoms are aggravated by bending. Risk factors include menopause, lack of exercise, history of cancer and poor posture. She has tried chiropractic manipulation, ice and analgesics for the symptoms. The treatment provided mild relief.   She is having difficulty applying her compression stockings on her own. She has pain with bending and has difficulty rising from a chair / position changes that involve sitting to standing. She is selling her car because she doesn't feel safe driving any more. She is arranging transportation with senior care.   Depression screen [facility] 2/9 03/04/2018 12/19/2017 07/31/2017 09/25/2016  Decreased Interest 1 0 0 0  Down, Depressed, Hopeless 1 0 1 0  PHQ - 2 Score 2 0 1 0  Altered sleeping - 3 - -  Tired, decreased energy - 2 - -  Change in appetite - 0 - -  Feeling bad or failure about yourself  - 0 - -  Trouble concentrating - 0 - -  Moving slowly or fidgety/restless - 0 - -  Suicidal thoughts - 0 - -  PHQ-9 Score - 5 - -    GAD 7 : Generalized Anxiety Score 12/19/2017  Nervous, Anxious, on Edge 1  Control/stop worrying 2  Worry too much - different things 3  Trouble relaxing 1  Restless 0  Easily annoyed or irritable 0  Afraid - awful might happen 1  Total GAD 7 Score 8      Past Medical History:  Diagnosis Date  . Anxiety   . Back pain, chronic   . Breast cancer ([place])    left  . Chronic prescription  benzodiazepine use 08/13/2017  . GERD (gastroesophageal reflux disease)   . Hemorrhoid   . HSV infection   . Hypertension   . 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 IBS (irritable bowel syndrome)   . Migraine   . Sterile pyuria 03/06/2018  . Vertigo    Past Surgical History:  Procedure Laterality Date  . ABDOMINAL HYSTERECTOMY    . BREAST SURGERY    . HERNIA REPAIR    . KNEE ARTHROSCOPY Left   . LUMBAR DISC SURGERY    . MASTECTOMY Left   . TONSILLECTOMY    . TUBAL LIGATION     Social History   Tobacco Use  . Smoking status: Never Smoker  . Smokeless tobacco: Never Used  Substance Use Topics  . Alcohol use: No    Alcohol/week: 0.0 oz   family history includes Hypertension in her mother; Stroke in her father.    ROS: negative except as noted in the HPI  Medications: Current Outpatient Medications  Medication Sig Dispense Refill  .  Acetaminophen (TYLENOL 8 HOUR ARTHRITIS PAIN PO) Take by mouth.    . ALPRAZolam (XANAX) 0.25 MG tablet TAKE ONE TABLET BY MOUTH AT BEDTIME AS NEEDED FOR ANXIETY 30 tablet 2  . AMBULATORY NON FORMULARY MEDICATION Knee-high, medium compression, graduated compression stockings. Apply to lower extremities. [email or web address], Zippered Compression Stockings, medium circ, long length 1 each 0  . AMBULATORY NON FORMULARY MEDICATION Rolling 4-point walker 1 each 0  . amLODipine (NORVASC) 5 MG tablet TAKE ONE TABLET BY MOUTH EVERY DAY 90 tablet 1  . BOOSTRIX 5-2.5-18.5 LF-MCG/0.5 injection     . clobetasol cream (TEMOVATE) 8.14 % Apply 1 application topically 2 (two) times daily. 15 g 0  . diphenoxylate-atropine (LOMOTIL) 2.5-0.025 MG tablet TAKE ONE TABLET BY MOUTH EVERY DAY AS NEEDED FOR DIARRHEA OR LOOSE STOOLS 30 tablet 5  . lidocaine (XYLOCAINE) 5 % ointment Apply 1 application as needed topically. 35.44 g 0  . lisinopril (PRINIVIL,ZESTRIL) 40 MG tablet Take 1 tablet (40 mg total) by mouth daily. 90 tablet 1  . loratadine  (CLARITIN) 10 MG tablet Take 1 tablet (10 mg total) by mouth daily. 10 tablet 0  . Misc Natural Products (COSAMIN ASU ADVANCED FORMULA) CAPS Take 2 tablets by mouth 2 (two) times daily. 120 capsule 11  . Multiple Minerals-Vitamins (CALCIUM-MAGNESIUM-ZINC-D3 PO) Take 1 tablet by mouth 2 (two) times daily.    . Multiple Vitamin (MULTIVITAMIN) tablet Take 1 tablet by mouth daily.    . Nebivolol HCl (BYSTOLIC) 20 MG TABS Take 2 tablets (40 mg total) by mouth daily. 60 tablet 5  . nystatin-triamcinolone ointment (MYCOLOG) Apply 1 application topically 2 (two) times daily. 30 g 0  . omeprazole (PRILOSEC) 20 MG capsule TAKE ONE CAPSULE BY MOUTH EVERY DAY 90 capsule 0  . OVER THE COUNTER MEDICATION Take 1 capsule by mouth daily. "Mega Red"    . Polyethylene Glycol 400 (BLINK TEARS OP) Place 1 drop into both eyes as needed (for dry eyes).    Marland Kitchen rOPINIRole (REQUIP) 0.25 MG tablet TAKE ONE TABLET BY MOUTH THREE TIMES A DAY 90 tablet 5  . sodium chloride (OCEAN) 0.65 % SOLN nasal spray Place 1 spray into both nostrils as needed for congestion. 15 mL 0  . valACYclovir (VALTREX) 500 MG tablet Take 1 tablet (500 mg total) by mouth daily. 30 tablet 11  . Wheat Dextrin (BENEFIBER DRINK MIX PO) Take by mouth.     No current facility-administered medications for this visit.    Allergies  Allergen Reactions  . Cefuroxime Axetil Shortness Of Breath    Has had cephalexin (Keflex) without side effects  . Edarbi [Azilsartan] Other (See Comments)    Heart pounding, foot swelling  . Methylprednisolone Other (See Comments) and Hypertension    Raised blood pressure and sugar  . Nystop [Nystatin] Other (See Comments)    Burning skin and breathing problems  . Penicillins Other (See Comments)    Face broke out in lumps  . Red Dye Itching, Rash and Other (See Comments)    Fainting  . Tamiflu [Oseltamivir Phosphate] Diarrhea and Other (See Comments)    Fainting  . Verelan [Verapamil Hcl Er] Shortness Of Breath  .  Zithromax [Azithromycin] Shortness Of Breath  . Carbidopa-Levodopa Itching and Other (See Comments)    Itchy ears, throat and tongue   . Doxazosin Mesylate Other (See Comments)    Fast pulse  . Hyoscyamine Other (See Comments)    Shakes, dizzy and headache  . Influenza Vaccines Other (See Comments)    "  head in a vice", nasal itching  . Other Other (See Comments)    Cardiolyte injection - burning through body  . Asa [Aspirin] Rash  . Atenolol Other (See Comments)    Unknown  . Avelox [Moxifloxacin] Other (See Comments)    Unknown  . Benicar [Olmesartan] Other (See Comments)    Unknown  . Bentyl [Dicyclomine Hcl] Other (See Comments)    Headache and high BP  . Benzonatate Other (See Comments)    Unknown  . Bisoprolol Other (See Comments)    Unknown  . Cardizem [Diltiazem Hcl] Other (See Comments)    Unknown  . Carvedilol Other (See Comments)    Unknown  . Cefdinir Other (See Comments)    Unknown  . Chlorthalidone Other (See Comments)    Unknown  . Ciprofloxacin Other (See Comments)    Heartburn  . Citalopram Other (See Comments)    Unknown  . Clonidine Derivatives Other (See Comments)    Unknown  . Diovan [Valsartan] Other (See Comments)    Unknown  . Doxycycline Other (See Comments)    Headache  . Erythromycin Other (See Comments)    Unknown  . Fioricet [Butalbital-Apap-Caffeine] Other (See Comments)    Headache and dizzy  . Flexeril [Cyclobenzaprine] Other (See Comments)    Unknown  . Furosemide Other (See Comments)    Unknown  . Hydralazine Other (See Comments)    Headache  . Hydroxyzine Other (See Comments)    nightmares  . Isosorbide Other (See Comments)    Unknown  . Lyrica [Pregabalin] Other (See Comments)    Unknown  . Macrobid [employer] Macro] Other (See Comments)    Unknown  . Mavik [Trandolapril] Other (See Comments)    Unknown  . Metronidazole Itching and Other (See Comments)    Dizzy  . Naproxen Other (See Comments)    Unknown   . Neo-Polycin Hc [Bacitra-Neomycin-Polymyxin-Hc] Itching  . Nexium [Esomeprazole Magnesium] Other (See Comments)    Headache  . Norflex [Orphenadrine Citrate] Other (See Comments)  . Oxybutynin Chloride Er Other (See Comments)    Unknown  . Paxil [Paroxetine Hcl] Other (See Comments)    Unknown  . Phenazopyridine Other (See Comments)    Unknown  . Prazosin Other (See Comments)    Unknown  . Prednisone Other (See Comments)    Unknown  . Promethazine Other (See Comments)    Unknown  . Prozac [Fluoxetine Hcl] Other (See Comments)    Unknown  . Spironolactone Other (See Comments)    Unknown  . Sulfur Other (See Comments)    Unknown  . Tizanidine Other (See Comments)    Unknown  . Valium [Diazepam] Other (See Comments)    "Too strong?"  . Zoloft [Sertraline Hcl] Other (See Comments)    Unknown       Objective:  BP (!) 168/85   Pulse 70   Wt 187 lb (84.8 kg)   BMI 29.73 kg/m  Gen:  alert, not ill-appearing, no distress, appropriate for age 6: head normocephalic without obvious abnormality, conjunctiva and cornea clear, wearing glasses, trachea midline Pulm: Normal work of breathing, normal phonation GI: asymmetric fat deposition in the LUQ, LUQ tenderness without rebound or guarding, no CVA tenderness Neuro: alert and oriented x 3, left-sided resting tremor of the upper extremity MSK: back atraumatic, rightward thoracic curvature of the spine with moderate kyphosis, midline thoracic tenderness at T6-T7; antalgic, slowed gait, ambulating with cane, 2+ peripheral edema to the proximal tibia Skin: intact, no rashes on exposed skin,  no jaundice, no cyanosis Psych: well-groomed, cooperative, good eye contact, euthymic mood, affect mood-congruent, speech is articulate, and thought processes clear and goal-directed    No results found for this or any previous visit (from the past 72 hour(s)). No results found.    Assessment and Plan: 77 y.o. female with   Acute on  chronic thoracic back pain, dextroscolios, degenerative disc disease - increasing her Tylenol to 1300 mg Q8H - she has declined epidural recommended by Sports Medicine. Declines oral NSAIDs and steroids - I think she would benefit greatly from home health PT and she is amenable to this  LUQ abdominal pain - long-standing history of IBS-D on Lomotil and GERD/Hiatal hernia on Omeprazole. Followed by Dr. Bryn Gulling. Personally reviewed CT abdomen report in [facility] from 11/2017, which showed some abnormalities in the small intestine and fluid in the colon. Recommend follow-up with GI at her earliest convenience  Peripheral venous insufficiency - bilateral femoral vein insufficiency on venous duplex 09/2017. She is having trouble with compression stockings due to Parkinson's and back pain. 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[facility]) - Plan: Ambulatory referral to Home Health  Right hip pain - Plan: Ambulatory referral to [place] with cane - Plan: Ambulatory referral to Home Health  At moderate risk for fall - Plan: Ambulatory referral to Home Health  LUQ pain - Plan: Ambulatory referral to Gastroenterology  Abnormal CT of the abdomen - Plan: Ambulatory referral to Gastroenterology  Dextroscoliosis   Patient education and anticipatory guidance given Patient agrees with treatment plan Follow-up as needed if symptoms worsen or fail to improve  Darlyne Russian PA-C

## 2018-03-16 NOTE — Patient Instructions (Signed)
Seen for hypertrophic nails. All nails debrided. Return as needed.  

## 2018-03-16 NOTE — Progress Notes (Signed)
Subjective: 77 y.o. year old female patient presents using a cane complaining of painful feet and toe nails. Patient requests toe nails trimmed.  Having difficulty walking due to arthritis in hips and left side tremor. Her last visit to the office was 10/07/16 for problematic toe nails.  Objective: Dermatologic: Thick yellow deformed nails x 10. Vascular: Pedal pulses are not palpable except for left foot DP. Positive pedal edema, pitting 2 +, bilateral. Orthopedic: Bilateral bunion deformities. Tight inter digital spaces of lesser digits bilateral. Hyperemia right inter digital space due to lateral toe pressure.  Neurologic: All epicritic and tactile sensations grossly intact.  Assessment: Dystrophic mycotic nails x 10. Bilateral pedal edema. PVD. Painful feet.  Treatment: All mycotic nails debrided.  Advised to use compression stockings and open toed shoes. Patient wishes to return as needed.

## 2018-03-23 ENCOUNTER — Telehealth: Payer: Self-pay

## 2018-03-24 NOTE — Telephone Encounter (Signed)
Preesh!

## 2018-04-06 ENCOUNTER — Encounter: Payer: Self-pay | Admitting: Physician Assistant

## 2018-04-06 ENCOUNTER — Ambulatory Visit (INDEPENDENT_AMBULATORY_CARE_PROVIDER_SITE_OTHER): Payer: Medicare Other | Admitting: Physician Assistant

## 2018-04-06 VITALS — BP 161/82 | HR 65 | Temp 98.1°F | Resp 14 | Wt 185.0 lb

## 2018-04-06 DIAGNOSIS — Z09 Encounter for follow-up examination after completed treatment for conditions other than malignant neoplasm: Secondary | ICD-10-CM

## 2018-04-06 DIAGNOSIS — K862 Cyst of pancreas: Secondary | ICD-10-CM | POA: Insufficient documentation

## 2018-04-06 DIAGNOSIS — K580 Irritable bowel syndrome with diarrhea: Secondary | ICD-10-CM

## 2018-04-06 MED ORDER — DICYCLOMINE HCL 10 MG PO CAPS: 10.0000 mg | ORAL_CAPSULE | Freq: Four times a day (QID) | ORAL | 5 refills | Status: AC | PRN

## 2018-04-06 NOTE — Progress Notes (Signed)
HPI:                                                                Dana Calderon is a 77 y.o. female who presents to [place] [place] today for hospital follow-up  Discharged from [facility] ED on 04/03/18 for acute cystitis and abdominal bloating.  CT abdomen was negative for acute process, but did reveal incidental findings of IMPRESSION: 1. No acute finding 2. Right middle lobe and lingular subsegmental atelectasis or scarring 3. Small hiatal hernia 4. 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 Recommend contrast enhanced MRI in 2 years [employer] Paper Recommendations, 2017).  5. Aortoiliac atherosclerosis without aneurysm   CXR also showed mild bibasilar atelectasis UA showed trace bacteruria, trace hematuria and moderate leuk esterase Urine culture grew >100,000 mixed GP flora She was treated empirically with Keflex  Reports she is tolerating the antibiotic well and is feeling improved. Denies fever, chills, suprapubic pressure, dysuria. Continues to have urinary frequency and abdominal bloating and cramping. Also having chronic loose stools and anal leakage. Denies constitutional symptoms, hematochezia She has a f/u appointment with GI in 3 days.         Past Medical History:  Diagnosis Date  . Anxiety   . Back pain, chronic   . Breast cancer ([place])    left  . 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 GERD (gastroesophageal reflux disease)   . Hemorrhoid   . HSV infection   . Hypertension   . 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 IBS (irritable bowel syndrome)   . Migraine   . Sterile pyuria 03/06/2018  . Vertigo    Past Surgical History:  Procedure Laterality Date  . ABDOMINAL HYSTERECTOMY    . BREAST SURGERY    . HERNIA REPAIR    . KNEE ARTHROSCOPY Left   . LUMBAR DISC SURGERY    . MASTECTOMY Left   . TONSILLECTOMY    . TUBAL LIGATION     Social History   Tobacco Use  . Smoking status:  Never Smoker  . Smokeless tobacco: Never Used  Substance Use Topics  . Alcohol use: No    Alcohol/week: 0.0 oz   family history includes Hypertension in her mother; Stroke in her father.    ROS: negative except as noted in the HPI  Medications: Current Outpatient Medications  Medication Sig Dispense Refill  . Acetaminophen (TYLENOL 8 HOUR ARTHRITIS PAIN PO) Take by mouth.    . AMBULATORY NON FORMULARY MEDICATION Knee-high, medium compression, graduated compression stockings. Apply to lower extremities. [email or web address], Zippered Compression Stockings, medium circ, long length 1 each 0  . AMBULATORY NON FORMULARY MEDICATION Rolling 4-point walker 1 each 0  . amLODipine (NORVASC) 5 MG tablet TAKE ONE TABLET BY MOUTH EVERY DAY 90 tablet 1  . BOOSTRIX 5-2.5-18.5 LF-MCG/0.5 injection     . clobetasol cream (TEMOVATE) 0.63 % Apply 1 application topically 2 (two) times daily. 15 g 0  . diphenoxylate-atropine (LOMOTIL) 2.5-0.025 MG tablet TAKE ONE TABLET BY MOUTH EVERY DAY AS NEEDED FOR DIARRHEA OR LOOSE STOOLS 30 tablet 5  . lidocaine (XYLOCAINE) 5 % ointment Apply 1 application as needed topically. 35.44 g 0  . lisinopril (PRINIVIL,ZESTRIL) 40 MG tablet Take 1 tablet (40 mg total)  by mouth daily. 90 tablet 1  . loratadine (CLARITIN) 10 MG tablet Take 1 tablet (10 mg total) by mouth daily. 10 tablet 0  . Misc Natural Products (COSAMIN ASU ADVANCED FORMULA) CAPS Take 2 tablets by mouth 2 (two) times daily. 120 capsule 11  . Multiple Minerals-Vitamins (CALCIUM-MAGNESIUM-ZINC-D3 PO) Take 1 tablet by mouth 2 (two) times daily.    . Multiple Vitamin (MULTIVITAMIN) tablet Take 1 tablet by mouth daily.    . Nebivolol HCl (BYSTOLIC) 20 MG TABS Take 2 tablets (40 mg total) by mouth daily. 60 tablet 5  . nystatin-triamcinolone ointment (MYCOLOG) Apply 1 application topically 2 (two) times daily. 30 g 0  . omeprazole (PRILOSEC) 20 MG capsule TAKE ONE CAPSULE BY MOUTH EVERY DAY 90 capsule 0  . OVER  THE COUNTER MEDICATION Take 1 capsule by mouth daily. "Mega Red"    . Polyethylene Glycol 400 (BLINK TEARS OP) Place 1 drop into both eyes as needed (for dry eyes).    Marland Kitchen rOPINIRole (REQUIP) 0.25 MG tablet TAKE ONE TABLET BY MOUTH THREE TIMES A DAY (Patient taking differently: TAKE 1/2 TABLET BY MOUTH SIX TIMES A DAY) 90 tablet 5  . sodium chloride (OCEAN) 0.65 % SOLN nasal spray Place 1 spray into both nostrils as needed for congestion. 15 mL 0  . valACYclovir (VALTREX) 500 MG tablet Take 1 tablet (500 mg total) by mouth daily. 30 tablet 11  . Wheat Dextrin (BENEFIBER DRINK MIX PO) Take by mouth.    . dicyclomine (BENTYL) 10 MG capsule Take 1 capsule (10 mg total) by mouth 4 (four) times daily as needed for spasms. 120 capsule 5   No current facility-administered medications for this visit.    Allergies  Allergen Reactions  . Cefuroxime Axetil Shortness Of Breath    Has had cephalexin (Keflex) without side effects  . Edarbi [Azilsartan] Other (See Comments)    Heart pounding, foot swelling  . Methylprednisolone Other (See Comments) and Hypertension    Raised blood pressure and sugar  . Nystop [Nystatin] Other (See Comments)    Burning skin and breathing problems  . Penicillins Other (See Comments)    Face broke out in lumps  . Red Dye Itching, Rash and Other (See Comments)    Fainting  . Tamiflu [Oseltamivir Phosphate] Diarrhea and Other (See Comments)    Fainting  . Verelan [Verapamil Hcl Er] Shortness Of Breath  . Zithromax [Azithromycin] Shortness Of Breath  . Carbidopa-Levodopa Itching and Other (See Comments)    Itchy ears, throat and tongue   . Doxazosin Mesylate Other (See Comments)    Fast pulse  . Hyoscyamine Other (See Comments)    Shakes, dizzy and headache  . Influenza Vaccines Other (See Comments)    "head in a vice", nasal itching  . Other Other (See Comments)    Cardiolyte injection - burning through body  . Asa [Aspirin] Rash  . Atenolol Other (See Comments)     Unknown  . Avelox [Moxifloxacin] Other (See Comments)    Unknown  . Benicar [Olmesartan] Other (See Comments)    Unknown  . Benzonatate Other (See Comments)    Unknown  . Bisoprolol Other (See Comments)    Unknown  . Cardizem [Diltiazem Hcl] Other (See Comments)    Unknown  . Carvedilol Other (See Comments)    Unknown  . Cefdinir Other (See Comments)    Unknown  . Chlorthalidone Other (See Comments)    Unknown  . Ciprofloxacin Other (See Comments)    Heartburn  .  Citalopram Other (See Comments)    Unknown  . Clonidine Derivatives Other (See Comments)    Unknown  . Diovan [Valsartan] Other (See Comments)    Unknown  . Doxycycline Other (See Comments)    Headache  . Erythromycin Other (See Comments)    Unknown  . Fioricet [Butalbital-Apap-Caffeine] Other (See Comments)    Headache and dizzy  . Flexeril [Cyclobenzaprine] Other (See Comments)    Unknown  . Furosemide Other (See Comments)    Unknown  . Hydralazine Other (See Comments)    Headache  . Hydroxyzine Other (See Comments)    nightmares  . Isosorbide Other (See Comments)    Unknown  . Lyrica [Pregabalin] Other (See Comments)    Unknown  . Macrobid [employer] Macro] Other (See Comments)    Unknown  . Mavik [Trandolapril] Other (See Comments)    Unknown  . Metronidazole Itching and Other (See Comments)    Dizzy  . Naproxen Other (See Comments)    Unknown  . Neo-Polycin Hc [Bacitra-Neomycin-Polymyxin-Hc] Itching  . Nexium [Esomeprazole Magnesium] Other (See Comments)    Headache  . Norflex [Orphenadrine Citrate] Other (See Comments)  . Oxybutynin Chloride Er Other (See Comments)    Unknown  . Paxil [Paroxetine Hcl] Other (See Comments)    Unknown  . Phenazopyridine Other (See Comments)    Unknown  . Prazosin Other (See Comments)    Unknown  . Prednisone Other (See Comments)    Unknown  . Promethazine Other (See Comments)    Unknown  . Prozac [Fluoxetine Hcl] Other (See Comments)     Unknown  . Spironolactone Other (See Comments)    Unknown  . Sulfur Other (See Comments)    Unknown  . Tizanidine Other (See Comments)    Unknown  . Valium [Diazepam] Other (See Comments)    "Too strong?"  . Zoloft [Sertraline Hcl] Other (See Comments)    Unknown       Objective:  BP (!) 161/82   Pulse 65   Temp 98.1 F (36.7 C)   Resp 14   Wt 185 lb (83.9 kg)   BMI 29.41 kg/m  Gen:  alert, not ill-appearing, no distress, appropriate for age 34: head normocephalic without obvious abnormality, conjunctiva and cornea clear, wearing glasses, trachea midline Pulm: Normal work of breathing, normal phonation, clear to auscultation bilaterally, no wheezes, rales or rhonchi CV: Normal rate, regular rhythm, s1 and s2 distinct, no murmurs, clicks or rubs  GI: bowel sounds active, abdomen soft, non-tender, no rigidity or guarding, no CVA tenderness Neuro: alert and oriented x 3, left-sided resting tremor MSK: extremities atraumatic, wearing b/l compression stockings, ambulating with a cane, slowed gait Skin: intact, no rashes on exposed skin, no jaundice, no cyanosis Psych: well-groomed, cooperative, good eye contact, euthymic mood, affect mood-congruent, speech is articulate, and thought processes clear and goal-directed    No results found for this or any previous visit (from the past 72 hour(s)). No results found.    Assessment and Plan: 77 y.o. female with   Hospital discharge follow-up  Irritable bowel syndrome with diarrhea - Plan: dicyclomine (BENTYL) 10 MG capsule  Pancreatic cyst - incidental finding on CT 04/03/18, MRI w/contrast recommended 04/2020  - personally reviewed diagnostic testing from ED visit on 04/03/18, including urine culture and CT abdomen/pelvis - no culture sensitivity report, but organism is gram positive. cont Keflex - trial of Bentyl 10 mg QID prn. Recommended probiotic, especially given frequent loose stools. Cont fiber supplement. Follow-up  with GI  Patient education and anticipatory guidance given Patient agrees with treatment plan Follow-up as needed if symptoms worsen or fail to improve  Darlyne Russian PA-C

## 2018-04-06 NOTE — Patient Instructions (Signed)
For your IBS: - continue your fiber supplement and high fiber foods like oatmeal and whole grain - eat yogurt with live and active cultures like Kefir or Activia. Or take an OTC probiotic such as Culturelle - you can take Dicyclomine 1 capsule as needed for abdominal cramping/pain/bloating. You can also schedule this 15 minutes before meals. You can take this safely up to 4 times per day  Diet for Irritable Bowel Syndrome When you have irritable bowel syndrome (IBS), the foods you eat and your eating habits are very important. IBS may cause various symptoms, such as abdominal pain, constipation, or diarrhea. Choosing the right foods can help ease discomfort caused by these symptoms. Work with your health care provider and dietitian to find the best eating plan to help control your symptoms. What general guidelines do I need to follow?  Keep a food diary. This will help you identify foods that cause symptoms. Write down: ? What you eat and when. ? What symptoms you have. ? When symptoms occur in relation to your meals.  Avoid foods that cause symptoms. Talk with your dietitian about other ways to get the same nutrients that are in these foods.  Eat more foods that contain fiber. Take a fiber supplement if directed by your dietitian.  Eat your meals slowly, in a relaxed setting.  Aim to eat 5-6 small meals per day. Do not skip meals.  Drink enough fluids to keep your urine clear or pale yellow.  Ask your health care provider if you should take an over-the-counter probiotic during flare-ups to help restore healthy gut bacteria.  If you have cramping or diarrhea, try making your meals low in fat and high in carbohydrates. Examples of carbohydrates are pasta, rice, whole grain breads and cereals, fruits, and vegetables.  If dairy products cause your symptoms to flare up, try eating less of them. You might be able to handle yogurt better than other dairy products because it contains bacteria  that help with digestion. What foods are not recommended? The following are some foods and drinks that may worsen your symptoms:  Fatty foods, such as [place] fries.  Milk products, such as cheese or ice cream.  Chocolate.  Alcohol.  Products with caffeine, such as coffee.  Carbonated drinks, such as soda.  The items listed above may not be a complete list of foods and beverages to avoid. Contact your dietitian for more information. What foods are good sources of fiber? Your health care provider or dietitian may recommend that you eat more foods that contain fiber. Fiber can help reduce constipation and other IBS symptoms. Add foods with fiber to your diet a little at a time so that your body can get used to them. Too much fiber at once might cause gas and swelling of your abdomen. The following are some foods that are good sources of fiber:  Apples.  Peaches.  Pears.  Berries.  Figs.  Broccoli (raw).  Cabbage.  Carrots.  Raw peas.  Kidney beans.  Lima beans.  Whole grain bread.  Whole grain cereal.  Where to find more information: [employer] for Functional Gastrointestinal Disorders: www.iffgd.[employer] of Diabetes and Digestive and Kidney Diseases: NetworkAffair.co.za.aspx This information is not intended to replace advice given to you by your health care provider. Make sure you discuss any questions you have with your health care provider. Document Released: 02/08/2004 Document Revised: 04/25/2016 Document Reviewed: 02/18/2014 Elsevier Interactive Patient Education  2018 [employer].

## 2018-04-07 ENCOUNTER — Other Ambulatory Visit: Payer: Self-pay | Admitting: Physician Assistant

## 2018-04-29 ENCOUNTER — Ambulatory Visit (INDEPENDENT_AMBULATORY_CARE_PROVIDER_SITE_OTHER): Payer: Medicare Other

## 2018-04-29 ENCOUNTER — Encounter: Payer: Self-pay | Admitting: Family Medicine

## 2018-04-29 ENCOUNTER — Ambulatory Visit (INDEPENDENT_AMBULATORY_CARE_PROVIDER_SITE_OTHER): Payer: Medicare Other | Admitting: Family Medicine

## 2018-04-29 VITALS — BP 141/80 | HR 67 | Wt 188.0 lb

## 2018-04-29 DIAGNOSIS — M25552 Pain in left hip: Secondary | ICD-10-CM

## 2018-04-29 NOTE — Patient Instructions (Signed)
Thank you for coming in today. Attend home physical therapy.  Continue tylenol for pain.  Consider Heating Pad.  Recheck in 6 weeks if improving.  Return sooner if not doing well or worsening.

## 2018-04-29 NOTE — Progress Notes (Signed)
Dana Calderon is a 77 y.o. female who presents to [place] today for left hip pain.  Faint was in her normal state of health yesterday.  She woke in the middle the night yesterday evening early this morning with left anterior hip pain.  She notes the pain is located in the groin.  She thinks a like it was wrapped around her leg.  She cannot recall any injury or change in activity.  She is tried some Tylenol which helps a bit.  No fevers or chills.   ROS:  As above  Exam:  BP (!) 141/80   Pulse 67   Wt 188 lb (85.3 kg)   BMI 29.89 kg/m  General: Well Developed, well nourished, and in no acute distress.  Neuro/Psych: Alert and oriented x3, extra-ocular muscles intact, able to move all 4 extremities, sensation grossly intact. Skin: Warm and dry, no rashes noted.  Respiratory: Not using accessory muscles, speaking in full sentences, trachea midline.  Cardiovascular: Pulses palpable, no extremity edema. Abdomen: Does not appear distended. MSK:  Left hip: Nontender significant decreased rotational range of motion due to pain.  Internal rotation of flexion reproduce pain.   Lab and Radiology Results Dg Hip Unilat With Pelvis 2-3 Views Left  Result Date: 04/29/2018 CLINICAL DATA:  Left hip pain since last night.  No known injury. EXAM: DG HIP (WITH OR WITHOUT PELVIS) 2-3V LEFT COMPARISON:  None in PACs FINDINGS: The bony pelvis is subjectively adequately mineralized. There is no lytic nor blastic pelvic lesion. AP and lateral views of the left hip reveal preservation of the joint space. The articular surfaces of the femoral head and acetabulum remains smoothly rounded. The femoral neck, intertrochanteric, and subtrochanteric regions are normal. IMPRESSION: There is no acute or significant chronic bony abnormality of the left hip. Electronically Signed   By: David  Martinique M.D.   On: 04/29/2018 11:37  I personally (independently) visualized and  performed the interpretation of the images attached in this note.    Assessment and Plan: 77 y.o. female with left anterior hip pain.  X-ray is unremarkable today.  No obvious fractures are seen and no significant DJD is seen.  I suspect patient had a hip abductor strain or cramp.  She is allergic to most medications therefore working to avoid adding more medications at this time.  Plan to add left hip flexor rehab to home health physical therapy already in existence.  Continue Tylenol for pain control. Recheck in 6 weeks return sooner if needed.    Orders Placed This Encounter  Procedures  . DG HIP UNILAT WITH PELVIS 2-3 VIEWS LEFT    Standing Status:   Future    Number of Occurrences:   1    Standing Expiration Date:   06/30/2019    Order Specific Question:   Reason for Exam (SYMPTOM  OR DIAGNOSIS REQUIRED)    Answer:   eval pain left hip    Order Specific Question:   Preferred imaging location?    Answer:   [facility]    Order Specific Question:   Radiology Contrast Protocol - do NOT remove file path    Answer:   [email or web address]   No orders of the defined types were placed in this encounter.   Historical information moved to improve visibility of documentation.  Past Medical History:  Diagnosis Date  . Anxiety   . Back pain, chronic   . Breast cancer ([place])    left  .  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 GERD (gastroesophageal reflux disease)   . Hemorrhoid   . HSV infection   . Hypertension   . 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 IBS (irritable bowel syndrome)   . Migraine   . Sterile pyuria 03/06/2018  . Vertigo    Past Surgical History:  Procedure Laterality Date  . ABDOMINAL HYSTERECTOMY    . BREAST SURGERY    . HERNIA REPAIR    . KNEE ARTHROSCOPY Left   . LUMBAR DISC SURGERY    . MASTECTOMY Left   . TONSILLECTOMY    . TUBAL LIGATION     Social History    Tobacco Use  . Smoking status: Never Smoker  . Smokeless tobacco: Never Used  Substance Use Topics  . Alcohol use: No    Alcohol/week: 0.0 oz   family history includes Hypertension in her mother; Stroke in her father.  Medications: Current Outpatient Medications  Medication Sig Dispense Refill  . Acetaminophen (TYLENOL 8 HOUR ARTHRITIS PAIN PO) Take by mouth.    . AMBULATORY NON FORMULARY MEDICATION Knee-high, medium compression, graduated compression stockings. Apply to lower extremities. [employer], Zippered Compression Stockings, medium circ, long length 1 each 0  . AMBULATORY NON FORMULARY MEDICATION Rolling 4-point walker 1 each 0  . amLODipine (NORVASC) 5 MG tablet TAKE ONE TABLET BY MOUTH EVERY DAY 90 tablet 1  . BOOSTRIX 5-2.5-18.5 LF-MCG/0.5 injection     . clobetasol cream (TEMOVATE) 4.09 % Apply 1 application topically 2 (two) times daily. 15 g 0  . dicyclomine (BENTYL) 10 MG capsule Take 1 capsule (10 mg total) by mouth 4 (four) times daily as needed for spasms. 120 capsule 5  . diphenoxylate-atropine (LOMOTIL) 2.5-0.025 MG tablet TAKE ONE TABLET BY MOUTH EVERY DAY AS NEEDED FOR DIARRHEA OR LOOSE STOOLS 30 tablet 5  . lidocaine (XYLOCAINE) 5 % ointment Apply 1 application as needed topically. 35.44 g 0  . lisinopril (PRINIVIL,ZESTRIL) 40 MG tablet Take 1 tablet (40 mg total) by mouth daily. 90 tablet 1  . loratadine (CLARITIN) 10 MG tablet Take 1 tablet (10 mg total) by mouth daily. 10 tablet 0  . Misc Natural Products (COSAMIN ASU ADVANCED FORMULA) CAPS Take 2 tablets by mouth 2 (two) times daily. 120 capsule 11  . Multiple Minerals-Vitamins (CALCIUM-MAGNESIUM-ZINC-D3 PO) Take 1 tablet by mouth 2 (two) times daily.    . Multiple Vitamin (MULTIVITAMIN) tablet Take 1 tablet by mouth daily.    . Nebivolol HCl (BYSTOLIC) 20 MG TABS Take 2 tablets (40 mg total) by mouth daily. 60 tablet 5  . nystatin-triamcinolone ointment (MYCOLOG) Apply 1 application topically 2  (two) times daily. 30 g 0  . omeprazole (PRILOSEC) 20 MG capsule Take 1 capsule (20 mg total) by mouth daily as needed (heartburn/indigestion). 90 capsule 0  . OVER THE COUNTER MEDICATION Take 1 capsule by mouth daily. "Mega Red"    . Polyethylene Glycol 400 (BLINK TEARS OP) Place 1 drop into both eyes as needed (for dry eyes).    Marland Kitchen rOPINIRole (REQUIP) 0.25 MG tablet TAKE ONE TABLET BY MOUTH THREE TIMES A DAY (Patient taking differently: TAKE 1/2 TABLET BY MOUTH SIX TIMES A DAY) 90 tablet 5  . sodium chloride (OCEAN) 0.65 % SOLN nasal spray Place 1 spray into both nostrils as needed for congestion. 15 mL 0  . valACYclovir (VALTREX) 500 MG tablet Take 1 tablet (500 mg total) by mouth daily. 30 tablet 11  . Wheat Dextrin (BENEFIBER DRINK MIX PO) Take by mouth.  No current facility-administered medications for this visit.    Allergies  Allergen Reactions  . Cefuroxime Axetil Shortness Of Breath    Has had cephalexin (Keflex) without side effects  . Edarbi [Azilsartan] Other (See Comments)    Heart pounding, foot swelling  . Methylprednisolone Other (See Comments) and Hypertension    Raised blood pressure and sugar  . Nystop [Nystatin] Other (See Comments)    Burning skin and breathing problems  . Penicillins Other (See Comments)    Face broke out in lumps  . Red Dye Itching, Rash and Other (See Comments)    Fainting  . Tamiflu [Oseltamivir Phosphate] Diarrhea and Other (See Comments)    Fainting  . Verelan [Verapamil Hcl Er] Shortness Of Breath  . Zithromax [Azithromycin] Shortness Of Breath  . Carbidopa-Levodopa Itching and Other (See Comments)    Itchy ears, throat and tongue   . Doxazosin Mesylate Other (See Comments)    Fast pulse  . Hyoscyamine Other (See Comments)    Shakes, dizzy and headache  . Influenza Vaccines Other (See Comments)    "head in a vice", nasal itching  . Other Other (See Comments)    Cardiolyte injection - burning through body  . Asa [Aspirin] Rash  .  Atenolol Other (See Comments)    Unknown  . Avelox [Moxifloxacin] Other (See Comments)    Unknown  . Benicar [Olmesartan] Other (See Comments)    Unknown  . Benzonatate Other (See Comments)    Unknown  . Bisoprolol Other (See Comments)    Unknown  . Cardizem [Diltiazem Hcl] Other (See Comments)    Unknown  . Carvedilol Other (See Comments)    Unknown  . Cefdinir Other (See Comments)    Unknown  . Chlorthalidone Other (See Comments)    Unknown  . Ciprofloxacin Other (See Comments)    Heartburn  . Citalopram Other (See Comments)    Unknown  . Clonidine Derivatives Other (See Comments)    Unknown  . Diovan [Valsartan] Other (See Comments)    Unknown  . Doxycycline Other (See Comments)    Headache  . Erythromycin Other (See Comments)    Unknown  . Fioricet [Butalbital-Apap-Caffeine] Other (See Comments)    Headache and dizzy  . Flexeril [Cyclobenzaprine] Other (See Comments)    Unknown  . Furosemide Other (See Comments)    Unknown  . Hydralazine Other (See Comments)    Headache  . Hydroxyzine Other (See Comments)    nightmares  . Isosorbide Other (See Comments)    Unknown  . Lyrica [Pregabalin] Other (See Comments)    Unknown  . Macrobid [employer] Macro] Other (See Comments)    Unknown  . Mavik [Trandolapril] Other (See Comments)    Unknown  . Metronidazole Itching and Other (See Comments)    Dizzy  . Naproxen Other (See Comments)    Unknown  . Neo-Polycin Hc [Bacitra-Neomycin-Polymyxin-Hc] Itching  . Nexium [Esomeprazole Magnesium] Other (See Comments)    Headache  . Norflex [Orphenadrine Citrate] Other (See Comments)  . Oxybutynin Chloride Er Other (See Comments)    Unknown  . Paxil [Paroxetine Hcl] Other (See Comments)    Unknown  . Phenazopyridine Other (See Comments)    Unknown  . Prazosin Other (See Comments)    Unknown  . Prednisone Other (See Comments)    Unknown  . Promethazine Other (See Comments)    Unknown  . Prozac [Fluoxetine  Hcl] Other (See Comments)    Unknown  . Spironolactone Other (See Comments)  Unknown  . Sulfur Other (See Comments)    Unknown  . Tizanidine Other (See Comments)    Unknown  . Valium [Diazepam] Other (See Comments)    "Too strong?"  . Zoloft [Sertraline Hcl] Other (See Comments)    Unknown      Discussed warning signs or symptoms. Please see discharge instructions. Patient expresses understanding.

## 2018-05-05 ENCOUNTER — Encounter: Payer: Self-pay | Admitting: Family Medicine

## 2018-05-05 ENCOUNTER — Ambulatory Visit (INDEPENDENT_AMBULATORY_CARE_PROVIDER_SITE_OTHER): Payer: Medicare Other | Admitting: Family Medicine

## 2018-05-05 ENCOUNTER — Ambulatory Visit (HOSPITAL_BASED_OUTPATIENT_CLINIC_OR_DEPARTMENT_OTHER)
Admission: RE | Admit: 2018-05-05 | Discharge: 2018-05-05 | Disposition: A | Discharge status: Home / Self Care | Payer: Medicare Other | Source: Ambulatory Visit | Attending: Family Medicine | Admitting: Family Medicine

## 2018-05-05 VITALS — BP 155/79 | HR 71 | Wt 187.0 lb

## 2018-05-05 DIAGNOSIS — M7989 Other specified soft tissue disorders: Secondary | ICD-10-CM

## 2018-05-05 DIAGNOSIS — X58XXXA Exposure to other specified factors, initial encounter: Secondary | ICD-10-CM | POA: Insufficient documentation

## 2018-05-05 DIAGNOSIS — M79605 Pain in left leg: Secondary | ICD-10-CM | POA: Diagnosis present

## 2018-05-05 DIAGNOSIS — T148XXA Other injury of unspecified body region, initial encounter: Secondary | ICD-10-CM | POA: Diagnosis not present

## 2018-05-05 NOTE — Patient Instructions (Addendum)
Thank you for coming in today. Please get ultrasound soonish.  [phone] to reschedule.   Get labs now.  Continue PT.   Recheck with me in 1-2 weeks.   Let me know if you are worsening.    Hematoma A hematoma is a collection of blood under the skin, in an organ, in a body space, in a joint space, or in other tissue. The blood can thicken (clot) to form a lump that you can see and feel. The lump is often firm and may become sore and tender. Most hematomas get better in a few days to weeks. However, some hematomas may be serious and require medical care. Hematomas can range from very small to very large. What are the causes? This condition is caused by:  A blunt or penetrating injury.  A leakage from a blood vessel under the skin. This leak happens on its own (is spontaneous) and is more likely to occur in older people, especially those who take blood thinners.  Some medical procedures including surgeries, such as oral surgery, face lifts, and surgeries that involve the joints.  Some medical conditions that cause bleeding or bruising problems. There may be multiple hematomas that appear in different areas of the body.  What are the signs or symptoms? Symptoms of this condition can depend on where the hematoma is located. Common symptoms of a hematoma under the skin include:  A firm lump on the body.  Pain and tenderness in the area.  Bruising. Blue, dark blue, purple-red, or yellowish skin (discoloration) may appear at the site of the hematoma if the hematoma is close to the surface of the skin.  For hematomas in deeper tissues or body spaces, symptoms may be less obvious. A collection of blood in the stomach (intra-abdominal hematoma) may cause pain in the abdomen, weakness, fainting, and shortness of breath. A collection of blood in the head (intracranial hematoma) may cause a headache or symptoms such as weakness, trouble speaking or understanding, or a change in  consciousness. How is this diagnosed? This condition is diagnosed based on:  Your medical history.  A physical exam.  Imaging tests, such as an ultrasonogram or CT scan. These may be needed if your health care provider suspects a hematoma in deeper tissues or body spaces.  Blood tests. These may be needed if your health care provider believes that the hematoma is caused by a medical condition.  How is this treated? This condition usually does not need treatment because many hematomas go away on their own over time. However, large hematomas, or those that may affect vital organs, may need surgical drainage or monitoring. If the hematoma is caused by a medical condition, medicines may be prescribed. Follow these instructions at home: Managing pain, stiffness, and swelling  If directed, apply ice to the affected area: ? Put ice in a plastic bag. ? Place a towel between your skin and the bag. ? Leave the ice on for 20 minutes, 2-3 times a day for the first couple of days.  After applying ice for a couple of days, your health care provider may recommend that you apply warm compresses to the affected area instead. Do this as told by your health care provider. Remove the heat if your skin turns bright red. This is especially important if you are unable to feel pain, heat, or cold. You may have a greater risk of getting burned  Raise (elevate) the affected area above the level of your heart while you are  sitting or lying down.  Wrap the affected area with an elastic bandage, if told by your health care provider. The bandage applies pressure (compression) to the area, which may help to reduce swelling and help the hematoma heal. Make sure the bandage is not wrapped too tight.  If your hematoma is on a leg or foot (lower extremity) and is painful, your health care provider may recommend crutches. Use them as told by your health care provider. General instructions  Take over-the-counter and  prescription medicines only as told by your health care provider.  Keep all follow-up visits as told by your health care provider. This is important. Contact a health care provider if:  You have a fever.  The swelling or discoloration gets worse.  You develop more hematomas. Get help right away if:  Your pain is worse or your pain is not controlled with medicine.  Your skin over the hematoma breaks or starts bleeding.  Your hematoma is in your chest or abdomen and you have weakness, shortness of breath, or a change in consciousness.  You have a hematoma on your scalp caused by a fall or injury and you have a headache that gets worse, trouble speaking or understanding, weakness, or a change in alertness or consciousness. Summary  A hematoma is a collection of blood under the skin, in an organ, in a body space, in a joint space, or in other tissue.  This condition usually does not need treatment because many hematomas go away on their own over time.  Large hematomas, or those that may affect vital organs, may need surgical drainage or monitoring. If the hematoma is caused by a medical condition, medicines may be prescribed. This information is not intended to replace advice given to you by your health care provider. Make sure you discuss any questions you have with your health care provider. Document Released: 07/02/2004 Document Revised: 12/21/2016 Document Reviewed: 12/21/2016 Elsevier Interactive Patient Education  2018 [employer].

## 2018-05-05 NOTE — Progress Notes (Signed)
Dana Calderon is a 77 y.o. female who presents to [facility] today for follow-up left leg pain and discuss new bruising along the left lateral leg.  Dana Calderon was seen on 5/29 for left lateral hip pain.  Etiology at that time was unclear but thought to be probably related to trochanteric bursitis or hip abductor tendinopathy.  She was asked to assist in home physical therapy activities.  She notes that she continues to hurt but last several days has developed a bruising rash on the lateral aspect of her leg associated with pain.  She does note existing leg swelling but denies worsening swelling.  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  She denies any injury or physical therapy massage to this area.  Denies any new occasions fevers chills or trouble breathing.   ROS as above:  Exam:  BP (!) 155/79   Pulse 71   Wt 187 lb (84.8 kg)   BMI 29.73 kg/m  Gen: Well NAD HEENT: EOMI,  MMM Lungs: Normal work of breathing. CTABL Heart: RRR no MRG Abd: NABS, Soft. Nondistended, Nontender Exts: Brisk capillary refill, warm and well perfused.  Skin left leg double ecchymosis appearing rash along the left leg extending from the lateral hip to the lateral knee.  No petechiae are present. The rash is not palpable. Mild TTP.  Mild 1+ edema foot BL.        Lab and Radiology Results Labs from 04/02/18 from [facility].  CBC: WBC 15.3, Hb 13.7, Plt 282 Care Everywhere Result Report Comprehensive Metabolic PanelResulted: 07/03/4234 11:17 PM [facility] Component Name Value Ref Range  Na 138 136 - 146 mmol/L  Potassium 4.9 3.7 - 5.4 mmol/L  Cl 98 97 - 108 mmol/L  CO2 28 20 - 32 mmol/L  Glucose 131 (H) 65 - 99 mg/dL  BUN 17 8 - 27 mg/dL  Creatinine 0.63 0.57 - 1 mg/dL  Ca 10.0 8.6 - 10.2 mg/dL  ALK PHOS 95 25 - 165 IU/L  T Bili 0.26 0 - 1.2 mg/dL  Total  Protein 8.0 6 - 8.5 gm/dL  Alb 4.1 3.5 - 4.8 gm/dL  GLOBULIN 3.9 1.5 - 4.5 gm/dL  ALBUMIN/GLOBULIN RATIO 1.1 1.1 - 2.5   BUN/CREAT RATIO 27.0 (H) 11 - 26   ALT 12 0 - 40 IU/L  AST 19 0 - 40 IU/L  GFR AFRICAN AMERICAN 101  Comment:  African-American:  Normal GFR (glomerular filtration rate) > 60 mL/min/1.73 meters squared. < 60 may include impaired kidney function based on creatinine, age, gender, and race normalized to accepted average body surface area mL/min/1.78m  GFR Non African American 87  Comment:  Non African American:  Normal GFR (glomerular filtration rate) > 60 mL/min/1.73 meters squared. < 60 may include impaired kidney function based on creatinine, age, gender, and race normalized to accepted average body surface area. mL/min/1.752m AGAP 12 7 - 16 mmol/L  Specimen Collected on  Blood 04/02/2018 10:38 PM     Assessment and Plan: [age].o. female with Leg pain and rash/bruising. Unclear etiology.  DDX includes bruising due to unknown trauma, shingles rash, vasculitis or some coagulopathy.   Plan to check labs listed below and Duplex US[place]oreacan.  Recheck soon.  Continue PT.  Follow up results.   Orders Placed This Encounter  Procedures  . US[place]oreaenous Img Lower Unilateral Left    Standing Status:   Future  Standing Expiration Date:   07/06/2019    Order Specific Question:   Reason for Exam (SYMPTOM  OR DIAGNOSIS REQUIRED)    Answer:   left leg swelling    Order Specific Question:   Preferred imaging location?    Answer:   [facility]  . CBC with Differential/Platelet  . COMPLETE METABOLIC PANEL WITH GFR  . INR/PT   No orders of the defined types were placed in this encounter.    Historical information moved to improve visibility of documentation.  Past Medical History:  Diagnosis Date  . Anxiety   . Back pain, chronic   . Breast cancer ([place])    left  . 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 GERD (gastroesophageal reflux disease)   .  Hemorrhoid   . HSV infection   . Hypertension   . 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 IBS (irritable bowel syndrome)   . Migraine   . Sterile pyuria 03/06/2018  . Vertigo    Past Surgical History:  Procedure Laterality Date  . ABDOMINAL HYSTERECTOMY    . BREAST SURGERY    . HERNIA REPAIR    . KNEE ARTHROSCOPY Left   . LUMBAR DISC SURGERY    . MASTECTOMY Left   . TONSILLECTOMY    . TUBAL LIGATION     Social History   Tobacco Use  . Smoking status: Never Smoker  . Smokeless tobacco: Never Used  Substance Use Topics  . Alcohol use: No    Alcohol/week: 0.0 oz   family history includes Hypertension in her mother; Stroke in her father.  Medications: Current Outpatient Medications  Medication Sig Dispense Refill  . Acetaminophen (TYLENOL 8 HOUR ARTHRITIS PAIN PO) Take by mouth.    . AMBULATORY NON FORMULARY MEDICATION Knee-high, medium compression, graduated compression stockings. Apply to lower extremities. [email or web address], Zippered Compression Stockings, medium circ, long length 1 each 0  . AMBULATORY NON FORMULARY MEDICATION Rolling 4-point walker 1 each 0  . amLODipine (NORVASC) 5 MG tablet TAKE ONE TABLET BY MOUTH EVERY DAY 90 tablet 1  . BOOSTRIX 5-2.5-18.5 LF-MCG/0.5 injection     . clobetasol cream (TEMOVATE) 7.51 % Apply 1 application topically 2 (two) times daily. 15 g 0  . dicyclomine (BENTYL) 10 MG capsule Take 1 capsule (10 mg total) by mouth 4 (four) times daily as needed for spasms. 120 capsule 5  . diphenoxylate-atropine (LOMOTIL) 2.5-0.025 MG tablet TAKE ONE TABLET BY MOUTH EVERY DAY AS NEEDED FOR DIARRHEA OR LOOSE STOOLS 30 tablet 5  . lidocaine (XYLOCAINE) 5 % ointment Apply 1 application as needed topically. 35.44 g 0  . lisinopril (PRINIVIL,ZESTRIL) 40 MG tablet Take 1 tablet (40 mg total) by mouth daily. 90 tablet 1  . loratadine (CLARITIN) 10 MG tablet Take 1 tablet (10 mg total) by mouth daily. 10 tablet 0  . Misc Natural  Products (COSAMIN ASU ADVANCED FORMULA) CAPS Take 2 tablets by mouth 2 (two) times daily. 120 capsule 11  . Multiple Minerals-Vitamins (CALCIUM-MAGNESIUM-ZINC-D3 PO) Take 1 tablet by mouth 2 (two) times daily.    . Multiple Vitamin (MULTIVITAMIN) tablet Take 1 tablet by mouth daily.    . Nebivolol HCl (BYSTOLIC) 20 MG TABS Take 2 tablets (40 mg total) by mouth daily. 60 tablet 5  . nystatin-triamcinolone ointment (MYCOLOG) Apply 1 application topically 2 (two) times daily. 30 g 0  . omeprazole (PRILOSEC) 20 MG capsule Take 1 capsule (20 mg total) by mouth daily as needed (heartburn/indigestion). 90 capsule 0  . OVER  THE COUNTER MEDICATION Take 1 capsule by mouth daily. "Mega Red"    . Polyethylene Glycol 400 (BLINK TEARS OP) Place 1 drop into both eyes as needed (for dry eyes).    Marland Kitchen rOPINIRole (REQUIP) 0.25 MG tablet TAKE ONE TABLET BY MOUTH THREE TIMES A DAY (Patient taking differently: TAKE 1/2 TABLET BY MOUTH SIX TIMES A DAY) 90 tablet 5  . sodium chloride (OCEAN) 0.65 % SOLN nasal spray Place 1 spray into both nostrils as needed for congestion. 15 mL 0  . valACYclovir (VALTREX) 500 MG tablet Take 1 tablet (500 mg total) by mouth daily. 30 tablet 11  . Wheat Dextrin (BENEFIBER DRINK MIX PO) Take by mouth.     No current facility-administered medications for this visit.    Allergies  Allergen Reactions  . Cefuroxime Axetil Shortness Of Breath    Has had cephalexin (Keflex) without side effects  . Edarbi [Azilsartan] Other (See Comments)    Heart pounding, foot swelling  . Methylprednisolone Other (See Comments) and Hypertension    Raised blood pressure and sugar  . Nystop [Nystatin] Other (See Comments)    Burning skin and breathing problems  . Penicillins Other (See Comments)    Face broke out in lumps  . Red Dye Itching, Rash and Other (See Comments)    Fainting  . Tamiflu [Oseltamivir Phosphate] Diarrhea and Other (See Comments)    Fainting  . Verelan [Verapamil Hcl Er] Shortness  Of Breath  . Zithromax [Azithromycin] Shortness Of Breath  . Carbidopa-Levodopa Itching and Other (See Comments)    Itchy ears, throat and tongue   . Doxazosin Mesylate Other (See Comments)    Fast pulse  . Hyoscyamine Other (See Comments)    Shakes, dizzy and headache  . Influenza Vaccines Other (See Comments)    "head in a vice", nasal itching  . Other Other (See Comments)    Cardiolyte injection - burning through body  . Asa [Aspirin] Rash  . Atenolol Other (See Comments)    Unknown  . Avelox [Moxifloxacin] Other (See Comments)    Unknown  . Benicar [Olmesartan] Other (See Comments)    Unknown  . Benzonatate Other (See Comments)    Unknown  . Bisoprolol Other (See Comments)    Unknown  . Cardizem [Diltiazem Hcl] Other (See Comments)    Unknown  . Carvedilol Other (See Comments)    Unknown  . Cefdinir Other (See Comments)    Unknown  . Chlorthalidone Other (See Comments)    Unknown  . Ciprofloxacin Other (See Comments)    Heartburn  . Citalopram Other (See Comments)    Unknown  . Clonidine Derivatives Other (See Comments)    Unknown  . Diovan [Valsartan] Other (See Comments)    Unknown  . Doxycycline Other (See Comments)    Headache  . Erythromycin Other (See Comments)    Unknown  . Fioricet [Butalbital-Apap-Caffeine] Other (See Comments)    Headache and dizzy  . Flexeril [Cyclobenzaprine] Other (See Comments)    Unknown  . Furosemide Other (See Comments)    Unknown  . Hydralazine Other (See Comments)    Headache  . Hydroxyzine Other (See Comments)    nightmares  . Isosorbide Other (See Comments)    Unknown  . Lyrica [Pregabalin] Other (See Comments)    Unknown  . Macrobid [employer] Macro] Other (See Comments)    Unknown  . Mavik [Trandolapril] Other (See Comments)    Unknown  . Metronidazole Itching and Other (See Comments)  Dizzy  . Naproxen Other (See Comments)    Unknown  . Neo-Polycin Hc [Bacitra-Neomycin-Polymyxin-Hc] Itching    . Nexium [Esomeprazole Magnesium] Other (See Comments)    Headache  . Norflex [Orphenadrine Citrate] Other (See Comments)  . Oxybutynin Chloride Er Other (See Comments)    Unknown  . Paxil [Paroxetine Hcl] Other (See Comments)    Unknown  . Phenazopyridine Other (See Comments)    Unknown  . Prazosin Other (See Comments)    Unknown  . Prednisone Other (See Comments)    Unknown  . Promethazine Other (See Comments)    Unknown  . Prozac [Fluoxetine Hcl] Other (See Comments)    Unknown  . Spironolactone Other (See Comments)    Unknown  . Sulfur Other (See Comments)    Unknown  . Tizanidine Other (See Comments)    Unknown  . Valium [Diazepam] Other (See Comments)    "Too strong?"  . Zoloft [Sertraline Hcl] Other (See Comments)    Unknown    Health Maintenance Health Maintenance  Topic Date Due  . DEXA SCAN  08/10/2006  . PNA vac Low Risk Adult (2 of 2 - PPSV23) 12/19/2018 (Originally 03/04/2012)  . INFLUENZA VACCINE  07/02/2018  . MAMMOGRAM  10/16/2019  . TETANUS/TDAP  12/20/2027    Discussed warning signs or symptoms. Please see discharge instructions. Patient expresses understanding.

## 2018-05-06 LAB — COMPLETE METABOLIC PANEL WITH GFR
AG Ratio: 1.4 (calc) (ref 1.0–2.5)
ALKALINE PHOSPHATASE (APISO): 87 U/L (ref 33–130)
ALT: 13 U/L (ref 6–29)
AST: 20 U/L (ref 10–35)
Albumin: 4.3 g/dL (ref 3.6–5.1)
BILIRUBIN TOTAL: 0.4 mg/dL (ref 0.2–1.2)
BUN: 17 mg/dL (ref 7–25)
CHLORIDE: 100 mmol/L (ref 98–110)
CO2: 31 mmol/L (ref 20–32)
CREATININE: 0.73 mg/dL (ref 0.60–0.93)
Calcium: 9.9 mg/dL (ref 8.6–10.4)
GFR, Est African American: 93 mL/min/{1.73_m2} (ref >=60)
GFR, Est Non African American: 80 mL/min/{1.73_m2} (ref >=60)
Globulin: 3 g/dL (calc) (ref 1.9–3.7)
Glucose, Bld: 106 mg/dL — ABNORMAL HIGH (ref 65–99)
Potassium: 4.4 mmol/L (ref 3.5–5.3)
Sodium: 139 mmol/L (ref 135–146)
Total Protein: 7.3 g/dL (ref 6.1–8.1)

## 2018-05-06 LAB — CBC WITH DIFFERENTIAL/PLATELET
BASOS ABS: 52 {cells}/uL (ref 0–200)
Basophils Relative: 0.5 %
EOS ABS: 124 {cells}/uL (ref 15–500)
EOS PCT: 1.2 %
HCT: 38 % (ref 35.0–45.0)
HEMOGLOBIN: 13 g/dL (ref 11.7–15.5)
Lymphs Abs: 2863 cells/uL (ref 850–3900)
MCH: 30.1 pg (ref 27.0–33.0)
MCHC: 34.2 g/dL (ref 32.0–36.0)
MCV: 88 fL (ref 80.0–100.0)
MPV: 10.6 fL (ref 7.5–12.5)
Monocytes Relative: 6.7 %
NEUTROS ABS: 6571 {cells}/uL (ref 1500–7800)
Neutrophils Relative %: 63.8 %
PLATELETS: 315 10*3/uL (ref 140–400)
RBC: 4.32 10*6/uL (ref 3.80–5.10)
RDW: 13 % (ref 11.0–15.0)
TOTAL LYMPHOCYTE: 27.8 %
WBC mixed population: 690 cells/uL (ref 200–950)
WBC: 10.3 10*3/uL (ref 3.8–10.8)

## 2018-05-06 LAB — PROTIME-INR
INR: 1
Prothrombin Time: 10.1 s (ref 9.0–11.5)

## 2018-05-07 ENCOUNTER — Telehealth: Payer: Self-pay | Admitting: Family Medicine

## 2018-05-07 NOTE — Telephone Encounter (Signed)
Pt called clinic stating the bruising is continuing to get worse on her leg. It has now spread up to her groin, and the bruising is getting darker and more painful. Pt states the [place] she had was normal, so questions what else could be causing this. Will route to treating physician for review.

## 2018-05-07 NOTE — Telephone Encounter (Signed)
Pt advised of recommendation. Scheduled with PCP for tomorrow, no openings with Georgina Snell until Monday. Pt refused to wait.

## 2018-05-07 NOTE — Telephone Encounter (Signed)
Labs and ultrasound were normal.  The cause was not clear at the last visit.  If worsening recheck Friday or Monday.  I am concerned but do not have anything to show right now that it is obviously dangerous.

## 2018-05-08 ENCOUNTER — Ambulatory Visit (INDEPENDENT_AMBULATORY_CARE_PROVIDER_SITE_OTHER): Payer: Medicare Other | Admitting: Physician Assistant

## 2018-05-08 ENCOUNTER — Encounter: Payer: Self-pay | Admitting: Physician Assistant

## 2018-05-08 VITALS — BP 148/77 | HR 62 | Wt 188.0 lb

## 2018-05-08 DIAGNOSIS — L309 Dermatitis, unspecified: Secondary | ICD-10-CM | POA: Diagnosis not present

## 2018-05-08 DIAGNOSIS — N898 Other specified noninflammatory disorders of vagina: Secondary | ICD-10-CM

## 2018-05-08 DIAGNOSIS — R58 Hemorrhage, not elsewhere classified: Secondary | ICD-10-CM

## 2018-05-08 DIAGNOSIS — M6281 Muscle weakness (generalized): Secondary | ICD-10-CM | POA: Diagnosis not present

## 2018-05-08 DIAGNOSIS — G4762 Sleep related leg cramps: Secondary | ICD-10-CM | POA: Insufficient documentation

## 2018-05-08 LAB — WET PREP FOR TRICH, YEAST, CLUE
MICRO NUMBER: 90686605
SPECIMEN QUALITY 3963: ADEQUATE

## 2018-05-08 MED ORDER — CLOTRIMAZOLE-BETAMETHASONE 1-0.05 % EX CREA: 1.0000 "application " | TOPICAL_CREAM | Freq: Two times a day (BID) | CUTANEOUS | 0 refills | Status: AC

## 2018-05-08 NOTE — Patient Instructions (Signed)
Apply Lotrisone to groin and vulva twice a day for up to 2 weeks Try sleeping without underwear if possible Avoid chaffing and friction Apply powder to keep area dry

## 2018-05-08 NOTE — Progress Notes (Signed)
HPI:                                                                Dana Calderon is a 77 y.o. female who presents to [facility] today for multiple concerns  This is a very pleasant 77 yo F with PMH of Parkinsonian tremor, chronic venous insufficiency, poorly controlled HTN, and mutliple medication intolerances.  Presents today with worsening left-sided weakness and unsteady gait for the last 2 weeks. She is really struggling with position changes and has started using her walker to ambulate where she had been using a cane. Denies any falls. Reports muscle cramps in her left leg, worse at night.  Tremor on her left side is also worse. She has not refilled her Mirapex. She is followed by Neuro, Dr. Theophilus Kinds.  She also complains of itching, discomfort and rawness in her left inguinal region. She has a history of vulvar dermatitis. She describes discomfort as "feeling like there are fleas in my vagina."  She still has significant bruising and tenderness of her left lower extremity. PT/INR, platelets were wnl. Doppler was negative for acute DVT 05/05/18.   Past Medical History:  Diagnosis Date  . Anxiety   . Back pain, chronic   . Breast cancer ([place])    left  . 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 GERD (gastroesophageal reflux disease)   . Hemorrhoid   . HSV infection   . Hypertension   . 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 IBS (irritable bowel syndrome)   . Migraine   . Sterile pyuria 03/06/2018  . Vertigo    Past Surgical History:  Procedure Laterality Date  . ABDOMINAL HYSTERECTOMY    . BREAST SURGERY    . HERNIA REPAIR    . KNEE ARTHROSCOPY Left   . LUMBAR DISC SURGERY    . MASTECTOMY Left   . TONSILLECTOMY    . TUBAL LIGATION     Social History   Tobacco Use  . Smoking status: Never Smoker  . Smokeless tobacco: Never Used  Substance Use Topics  . Alcohol use: No     Alcohol/week: 0.0 oz   family history includes Hypertension in her mother; Stroke in her father.    ROS: negative except as noted in the HPI  Medications: Current Outpatient Medications  Medication Sig Dispense Refill  . Acetaminophen (TYLENOL 8 HOUR ARTHRITIS PAIN PO) Take by mouth.    . AMBULATORY NON FORMULARY MEDICATION Knee-high, medium compression, graduated compression stockings. Apply to lower extremities. [email or web address], Zippered Compression Stockings, medium circ, long length 1 each 0  . AMBULATORY NON FORMULARY MEDICATION Rolling 4-point walker 1 each 0  . amLODipine (NORVASC) 5 MG tablet TAKE ONE TABLET BY MOUTH EVERY DAY 90 tablet 1  . BOOSTRIX 5-2.5-18.5 LF-MCG/0.5 injection     . clobetasol cream (TEMOVATE) 7.16 % Apply 1 application topically 2 (two) times daily. 15 g 0  . dicyclomine (BENTYL) 10 MG capsule Take 1 capsule (10 mg total) by mouth 4 (four) times daily as needed for spasms. 120 capsule 5  . diphenoxylate-atropine (LOMOTIL) 2.5-0.025 MG tablet TAKE ONE TABLET BY MOUTH EVERY DAY AS NEEDED FOR DIARRHEA OR LOOSE STOOLS 30 tablet 5  . lidocaine (XYLOCAINE) 5 %  ointment Apply 1 application as needed topically. 35.44 g 0  . lisinopril (PRINIVIL,ZESTRIL) 40 MG tablet Take 1 tablet (40 mg total) by mouth daily. 90 tablet 1  . loratadine (CLARITIN) 10 MG tablet Take 1 tablet (10 mg total) by mouth daily. 10 tablet 0  . Misc Natural Products (COSAMIN ASU ADVANCED FORMULA) CAPS Take 2 tablets by mouth 2 (two) times daily. 120 capsule 11  . Multiple Minerals-Vitamins (CALCIUM-MAGNESIUM-ZINC-D3 PO) Take 1 tablet by mouth 2 (two) times daily.    . Multiple Vitamin (MULTIVITAMIN) tablet Take 1 tablet by mouth daily.    . Nebivolol HCl (BYSTOLIC) 20 MG TABS Take 2 tablets (40 mg total) by mouth daily. 60 tablet 5  . nystatin-triamcinolone ointment (MYCOLOG) Apply 1 application topically 2 (two) times daily. 30 g 0  . omeprazole (PRILOSEC) 20 MG capsule Take 1 capsule  (20 mg total) by mouth daily as needed (heartburn/indigestion). 90 capsule 0  . OVER THE COUNTER MEDICATION Take 1 capsule by mouth daily. "Mega Red"    . Polyethylene Glycol 400 (BLINK TEARS OP) Place 1 drop into both eyes as needed (for dry eyes).    Marland Kitchen rOPINIRole (REQUIP) 0.25 MG tablet TAKE ONE TABLET BY MOUTH THREE TIMES A DAY (Patient taking differently: TAKE 1/2 TABLET BY MOUTH SIX TIMES A DAY) 90 tablet 5  . sodium chloride (OCEAN) 0.65 % SOLN nasal spray Place 1 spray into both nostrils as needed for congestion. 15 mL 0  . valACYclovir (VALTREX) 500 MG tablet Take 1 tablet (500 mg total) by mouth daily. 30 tablet 11  . Wheat Dextrin (BENEFIBER DRINK MIX PO) Take by mouth.    . clotrimazole-betamethasone (LOTRISONE) cream Apply 1 application topically 2 (two) times daily for 14 days. 45 g 0   No current facility-administered medications for this visit.    Allergies  Allergen Reactions  . Cefuroxime Axetil Shortness Of Breath    Has had cephalexin (Keflex) without side effects  . Edarbi [Azilsartan] Other (See Comments)    Heart pounding, foot swelling  . Methylprednisolone Other (See Comments) and Hypertension    Raised blood pressure and sugar  . Nystop [Nystatin] Other (See Comments)    Burning skin and breathing problems  . Penicillins Other (See Comments)    Face broke out in lumps  . Red Dye Itching, Rash and Other (See Comments)    Fainting  . Tamiflu [Oseltamivir Phosphate] Diarrhea and Other (See Comments)    Fainting  . Verelan [Verapamil Hcl Er] Shortness Of Breath  . Zithromax [Azithromycin] Shortness Of Breath  . Carbidopa-Levodopa Itching and Other (See Comments)    Itchy ears, throat and tongue   . Doxazosin Mesylate Other (See Comments)    Fast pulse  . Hyoscyamine Other (See Comments)    Shakes, dizzy and headache  . Influenza Vaccines Other (See Comments)    "head in a vice", nasal itching  . Other Other (See Comments)    Cardiolyte injection - burning  through body  . Asa [Aspirin] Rash  . Atenolol Other (See Comments)    Unknown  . Avelox [Moxifloxacin] Other (See Comments)    Unknown  . Benicar [Olmesartan] Other (See Comments)    Unknown  . Benzonatate Other (See Comments)    Unknown  . Bisoprolol Other (See Comments)    Unknown  . Cardizem [Diltiazem Hcl] Other (See Comments)    Unknown  . Carvedilol Other (See Comments)    Unknown  . Cefdinir Other (See Comments)  Unknown  . Chlorthalidone Other (See Comments)    Unknown  . Ciprofloxacin Other (See Comments)    Heartburn  . Citalopram Other (See Comments)    Unknown  . Clonidine Derivatives Other (See Comments)    Unknown  . Diovan [Valsartan] Other (See Comments)    Unknown  . Doxycycline Other (See Comments)    Headache  . Erythromycin Other (See Comments)    Unknown  . Fioricet [Butalbital-Apap-Caffeine] Other (See Comments)    Headache and dizzy  . Flexeril [Cyclobenzaprine] Other (See Comments)    Unknown  . Furosemide Other (See Comments)    Unknown  . Hydralazine Other (See Comments)    Headache  . Hydroxyzine Other (See Comments)    nightmares  . Isosorbide Other (See Comments)    Unknown  . Lyrica [Pregabalin] Other (See Comments)    Unknown  . Macrobid [employer] Macro] Other (See Comments)    Unknown  . Mavik [Trandolapril] Other (See Comments)    Unknown  . Metronidazole Itching and Other (See Comments)    Dizzy  . Naproxen Other (See Comments)    Unknown  . Neo-Polycin Hc [Bacitra-Neomycin-Polymyxin-Hc] Itching  . Nexium [Esomeprazole Magnesium] Other (See Comments)    Headache  . Norflex [Orphenadrine Citrate] Other (See Comments)  . Oxybutynin Chloride Er Other (See Comments)    Unknown  . Paxil [Paroxetine Hcl] Other (See Comments)    Unknown  . Phenazopyridine Other (See Comments)    Unknown  . Prazosin Other (See Comments)    Unknown  . Prednisone Other (See Comments)    Unknown  . Promethazine Other (See  Comments)    Unknown  . Prozac [Fluoxetine Hcl] Other (See Comments)    Unknown  . Spironolactone Other (See Comments)    Unknown  . Sulfur Other (See Comments)    Unknown  . Tizanidine Other (See Comments)    Unknown  . Valium [Diazepam] Other (See Comments)    "Too strong?"  . Zoloft [Sertraline Hcl] Other (See Comments)    Unknown       Objective:  BP (!) 148/77   Pulse 62   Wt 188 lb (85.3 kg)   BMI 29.89 kg/m  Gen:  alert, not ill-appearing, no distress, appropriate for age 45: head normocephalic without obvious abnormality, conjunctiva and cornea clear, wearing glasses, trachea midline Pulm: Normal work of breathing, normal phonation GU: vulva and left inguinal fold with erythematous rash, normal introitus and urethral meatus, vaginal mucosa without erythema, normal discharge Neuro: alert and oriented x 3, resting tremor of the left arm MSK: extremities atraumatic, bilateral lower extremity edema, ambulating with a cane, abnormal get up and go, required 2-person assistance getting on and off the exam table Skin: ecchymoses of the left lateral thigh and popliteal fossa Psych: well-groomed, cooperative, good eye contact, appears anxious,  speech is articulate, and thought processes clear and goal-directed    A chaperone was used for the GU portion of the exam, Izell Oakdale, CMA.   Results for orders placed or performed in visit on 05/08/18 (from the past 72 hour(s))  [place], YEAST, CLUE     Status: None   Collection Time: 05/08/18 11:29 AM  Result Value Ref Range   MICRO NUMBER: [id number]    Specimen Quality ADEQUATE    SOURCE: NOT GIVEN    Status FINAL    RESULT      No Trichomonas vaginalis seen. No yeast seen No clue cells seen Epithelial Cells Present  US Venous Img Lower Unilateral Left  Result Date: 05/06/2018 CLINICAL DATA:  Left lower extremity pain and edema since Sunday. No known trauma. History of breast cancer. Evaluate for DVT. EXAM:  LEFT LOWER EXTREMITY VENOUS DOPPLER ULTRASOUND TECHNIQUE: Gray-scale sonography with graded compression, as well as color Doppler and duplex ultrasound were performed to evaluate the lower extremity deep venous systems from the level of the common femoral vein and including the common femoral, femoral, profunda femoral, popliteal and calf veins including the posterior tibial, peroneal and gastrocnemius veins when visible. The superficial great saphenous vein was also interrogated. Spectral Doppler was utilized to evaluate flow at rest and with distal augmentation maneuvers in the common femoral, femoral and popliteal veins. COMPARISON:  Bilateral lower extremity venous Doppler ultrasound - 04/03/2017 FINDINGS: Contralateral Common Femoral Vein: Respiratory phasicity is normal and symmetric with the symptomatic side. No evidence of thrombus. Normal compressibility. Common Femoral Vein: No evidence of thrombus. Normal compressibility, respiratory phasicity and response to augmentation. Saphenofemoral Junction: No evidence of thrombus. Normal compressibility and flow on color Doppler imaging. Profunda Femoral Vein: No evidence of thrombus. Normal compressibility and flow on color Doppler imaging. Femoral Vein: No evidence of thrombus. Normal compressibility, respiratory phasicity and response to augmentation. Popliteal Vein: No evidence of thrombus. Normal compressibility, respiratory phasicity and response to augmentation. Calf Veins: No evidence of thrombus. Normal compressibility and flow on color Doppler imaging. Superficial Great Saphenous Vein: No evidence of thrombus. Normal compressibility. Venous Reflux:  None. Other Findings:  None. IMPRESSION: No evidence of DVT within the left lower extremity. Electronically Signed   By: Sandi Mariscal M.D.   On: 05/06/2018 07:34      Assessment and Plan: 77 y.o. female with   Left-sided muscle weakness  Vaginal pruritus - Plan: WET PREP FOR TRICH, YEAST,  CLUE  Vulvar dermatitis - Plan: clotrimazole-betamethasone (LOTRISONE) cream  Multiple ecchymoses of thigh  Nocturnal leg cramps  Left-sided muscle weakness I am most concerned about the large decline in functional status. Zulema has never needed assistance with dressing or ambulating up until today. Increasing home PT to 3 times per week. Verbal order given to [facility] I have also instructed her to use walker instead of cane Her worsening tremor is likely due to poor medication adherence. Encouraged her to refill Requip today  Vulvar dermatitis/Vaginal pruritus Discomfort is largely due to external irritation. Reassured patient that she does not have a flea infestation.  Lotrisone bid for 2 weeks. Counseled on general measures  Multiple ecchymoses of thigh Negative work-up including PT/INR, platelets and doppler Reassurance that this is not a clot or anything threatening life or limb  Nocturnal leg cramps Normal electrolytes on CMP 3 days ago Counseled on general measures - warm bath, self-massage, gentle stretching before bed  Patient education and anticipatory guidance given Patient agrees with treatment plan Follow-up as needed if symptoms worsen or fail to improve  Darlyne Russian PA-C

## 2018-05-14 ENCOUNTER — Encounter: Payer: Medicare Other | Admitting: Vascular Surgery

## 2018-05-21 ENCOUNTER — Telehealth: Payer: Self-pay

## 2018-05-21 NOTE — Telephone Encounter (Signed)
Pt stated that she still having issues with her vaginal area.  She said that yeast rash is still there and the cream is not helping.  Please advise. -EH/RMA

## 2018-05-21 NOTE — Telephone Encounter (Signed)
Dr. Hulan Fray wanted to biopsy her if if her symptoms did not improve. She needs to schedule an appointment with Dr. Hulan Fray

## 2018-05-21 NOTE — Telephone Encounter (Signed)
Pt notified of recommendations -EH/RMA  

## 2018-05-28 ENCOUNTER — Telehealth: Payer: Self-pay

## 2018-05-28 DIAGNOSIS — G252 Other specified forms of tremor: Secondary | ICD-10-CM

## 2018-05-28 DIAGNOSIS — R259 Unspecified abnormal involuntary movements: Secondary | ICD-10-CM

## 2018-05-28 NOTE — Telephone Encounter (Signed)
Referral placed to [facility] Please let patient know it could be 1-3 months for a new patient appointment

## 2018-05-28 NOTE — Telephone Encounter (Signed)
Pt would like for a referral to neurology within [facility].  She stated that she is having issues with sleep.  She said that it's hard for her to fall asleep at night but could be doing things on her tablet during the day and easily fall asleep.  She googled her Sx and said that it was associated with her Parkinson's disease.  Please advise. -EH/RMA

## 2018-05-31 IMAGING — DX DG LUMBAR SPINE COMPLETE 4+V
5 series · 5 of 5 positions shown · non-contrast
Comparison: None

CLINICAL DATA: Lower back pain since an ambulance ride a few days
ago, acute BILATERAL low back pain without sciatica, prior L4
surgery

EXAM:
LUMBAR SPINE - COMPLETE 4+ VIEW

[l-spine ap]
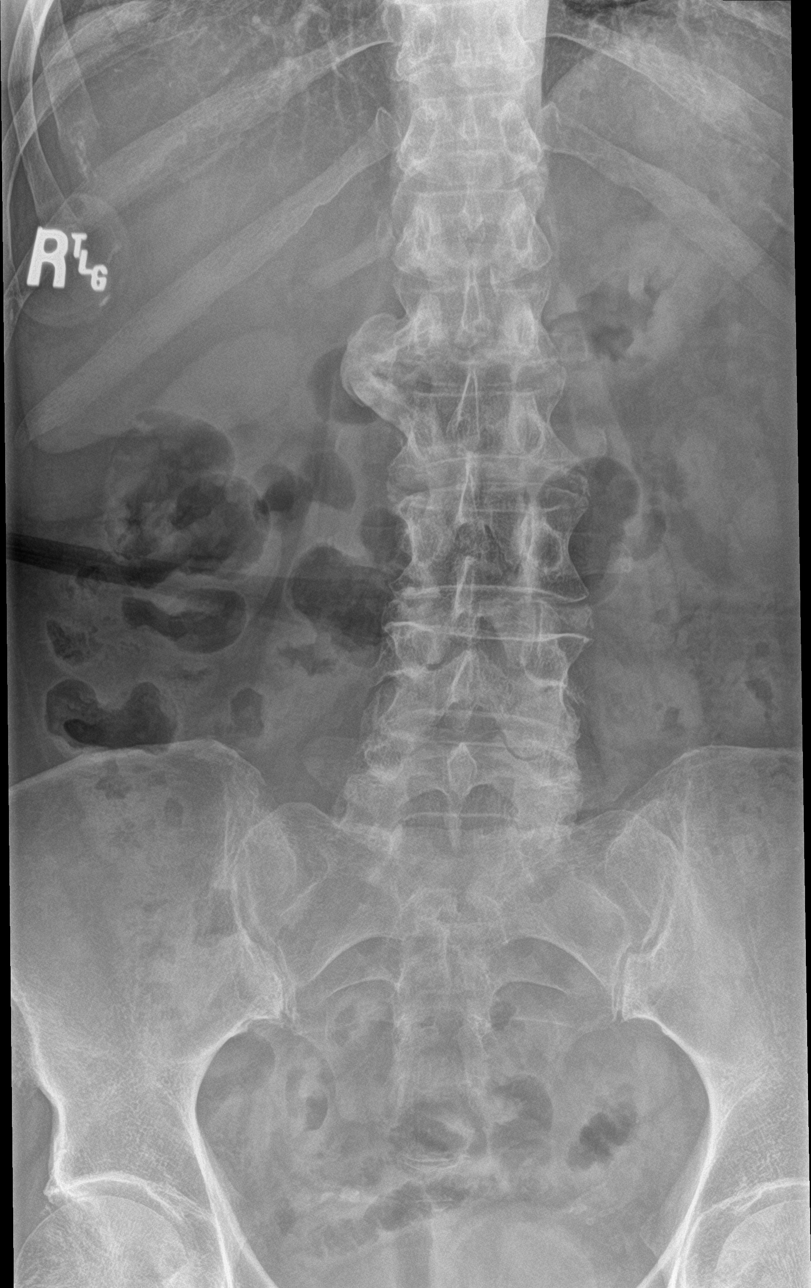

[l-spine obl (1 of 2)]
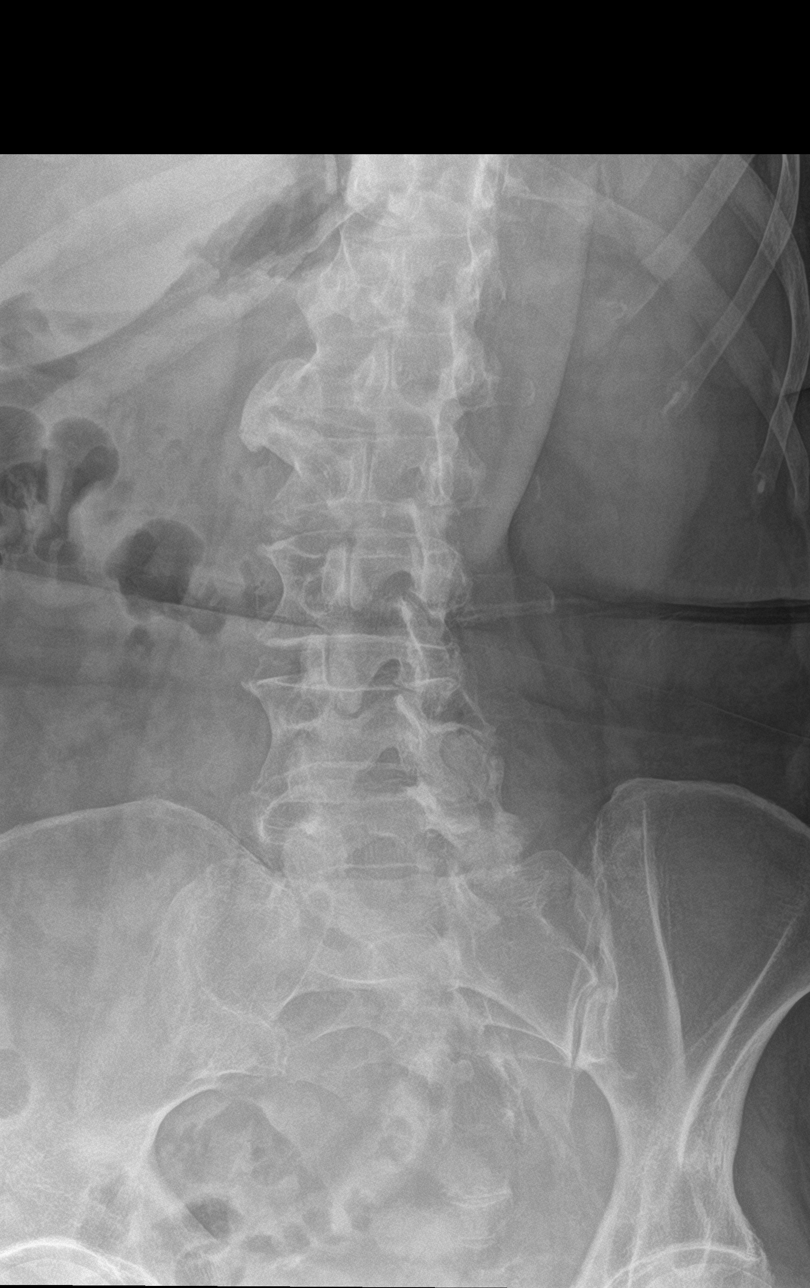

[l-spine obl (2 of 2)]
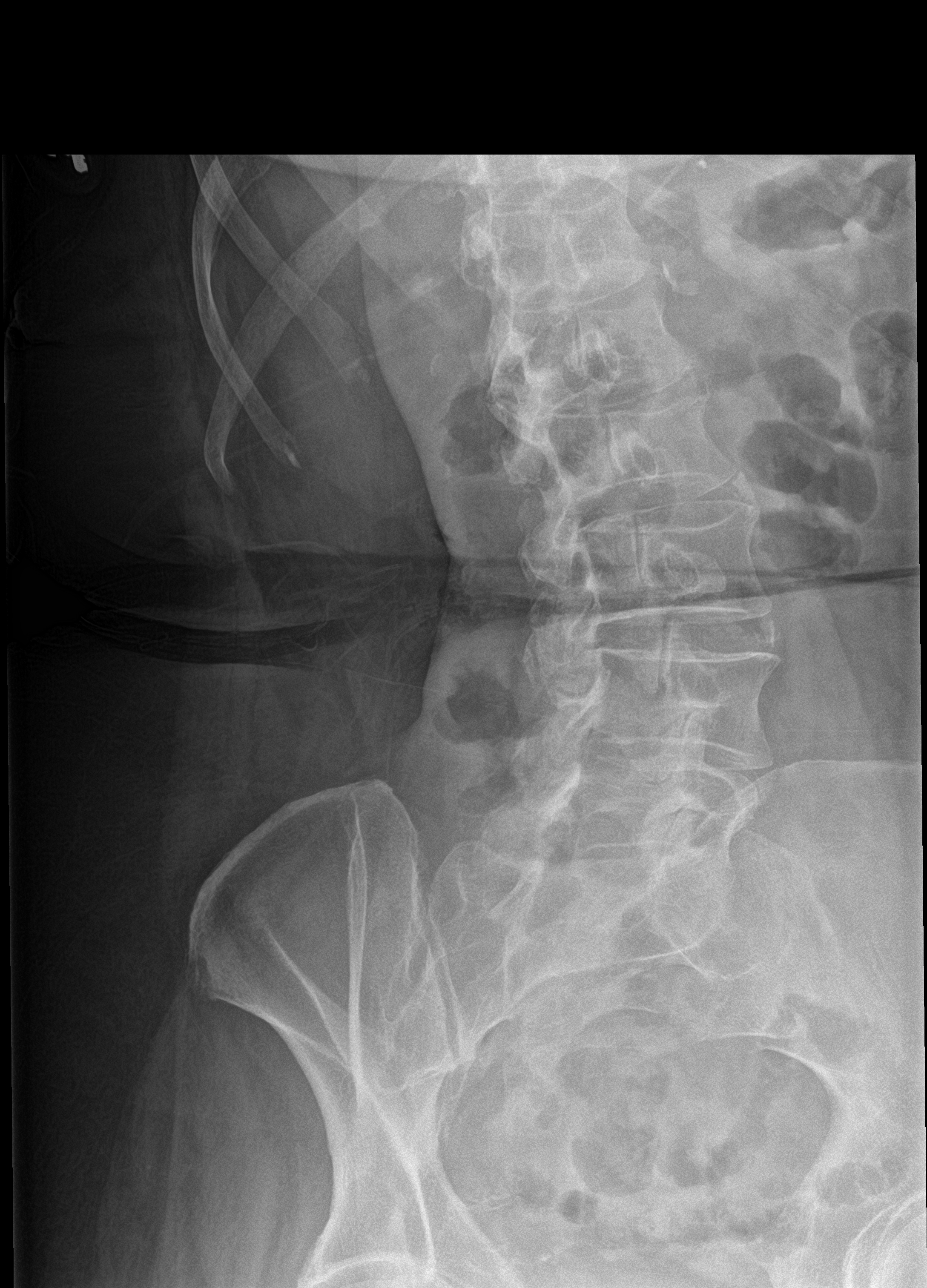

[l-spine lat]
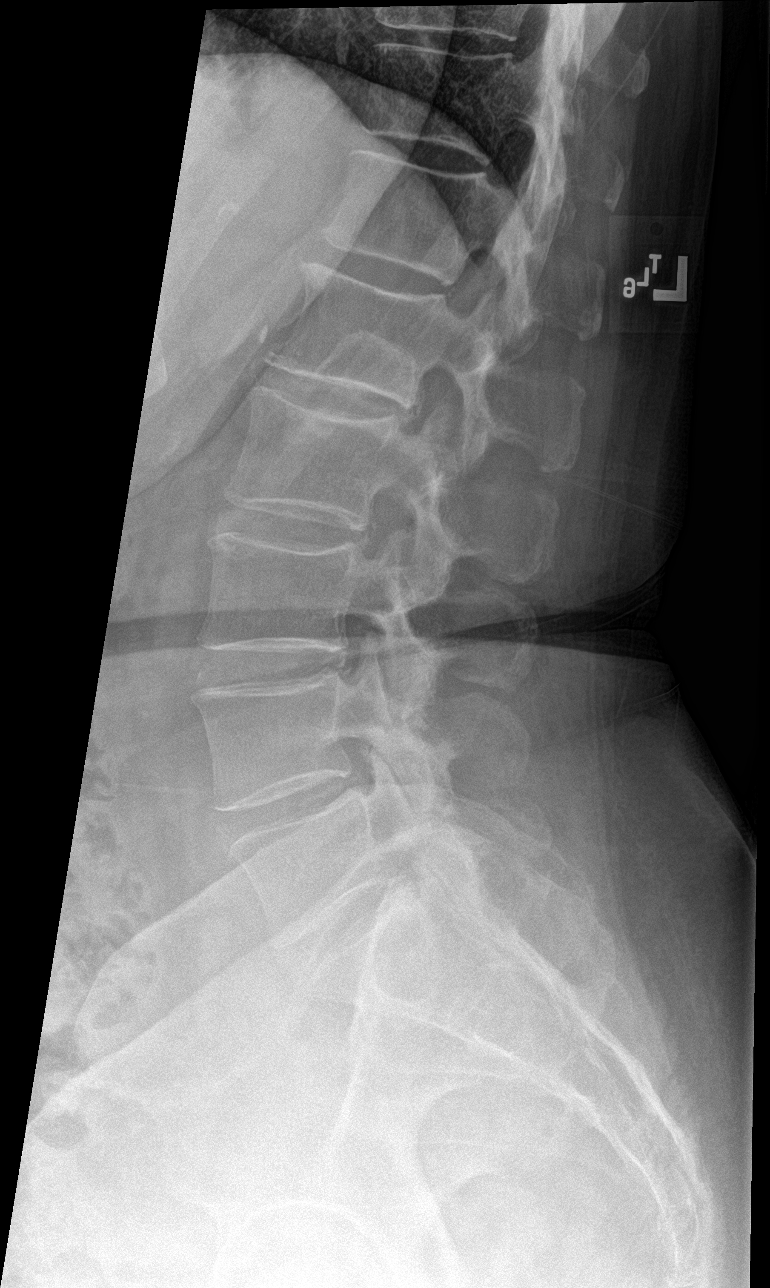

[l-spine spot]
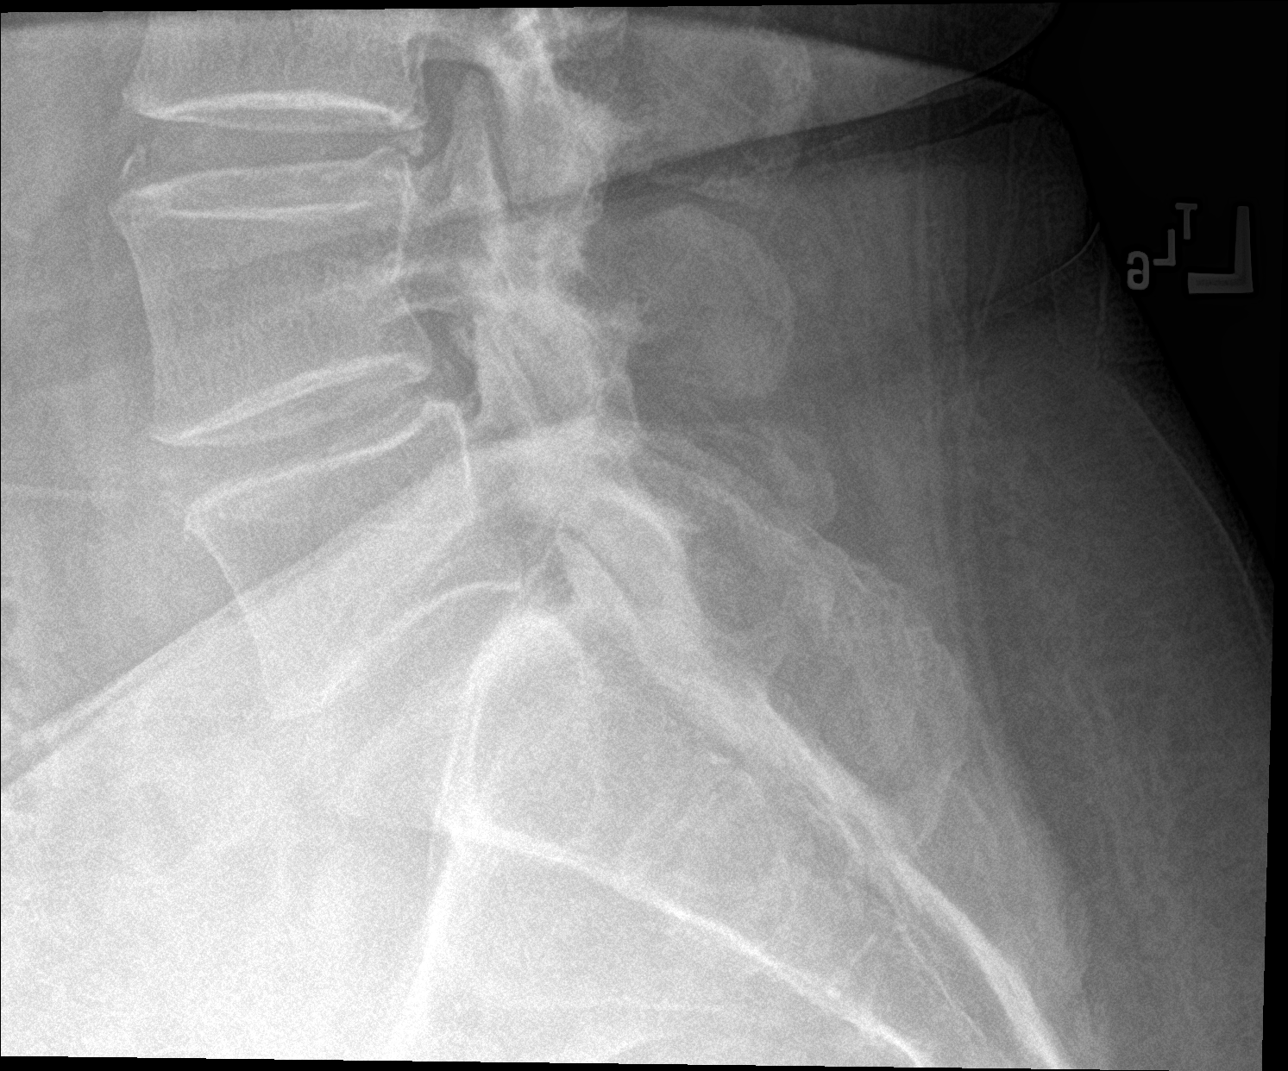

[5 of 5 positions shown; findings below may reference images not displayed]

FINDINGS: Five non-rib-bearing lumbar vertebra.

Diffuse osseous demineralization.

Minimal levoconvex scoliosis apex L3.

Facet degenerative changes lower lumbar spine.

Minimal scattered disc space narrowing greatest at L4-L5.

Bulky RIGHT lateral spurs at L1-L2.

No acute fracture, subluxation, or bone destruction.

No spondylolysis.

SI joints symmetric.
IMPRESSION: Osseous demineralization with mild degenerative changes as above.

No acute abnormalities.

## 2018-05-31 IMAGING — DX DG KNEE COMPLETE 4+V*L*
4 series · 4 of 4 positions shown · non-contrast
Comparison: None

CLINICAL DATA: Acute LEFT knee pain since an ambulance transport a
few days ago, history of meniscal tear

EXAM:
LEFT KNEE - COMPLETE 4+ VIEW

[knee ap]
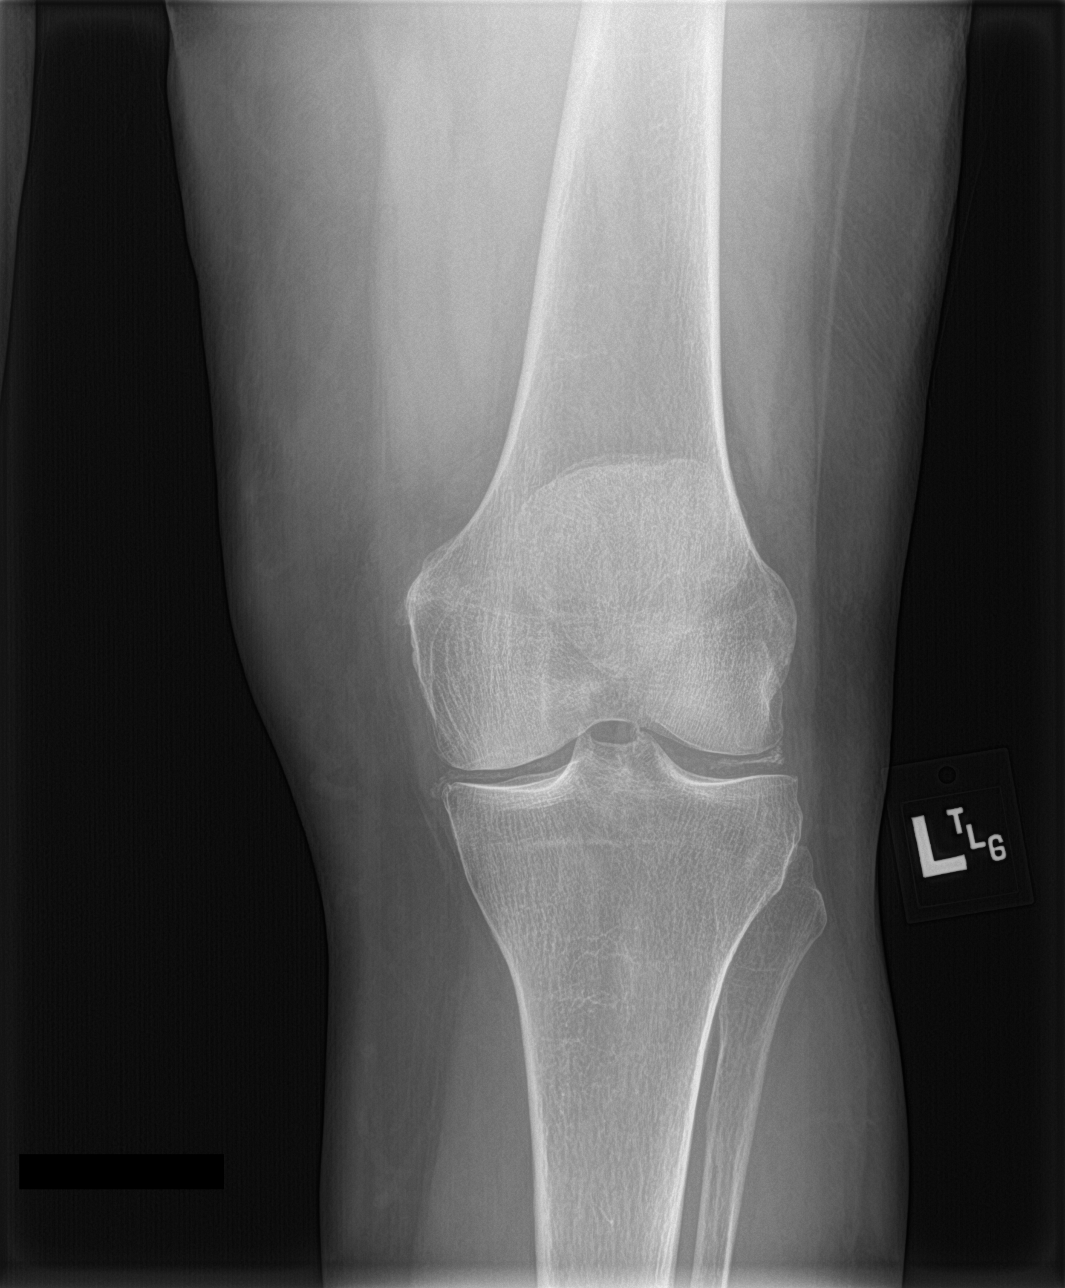

[tunnel]
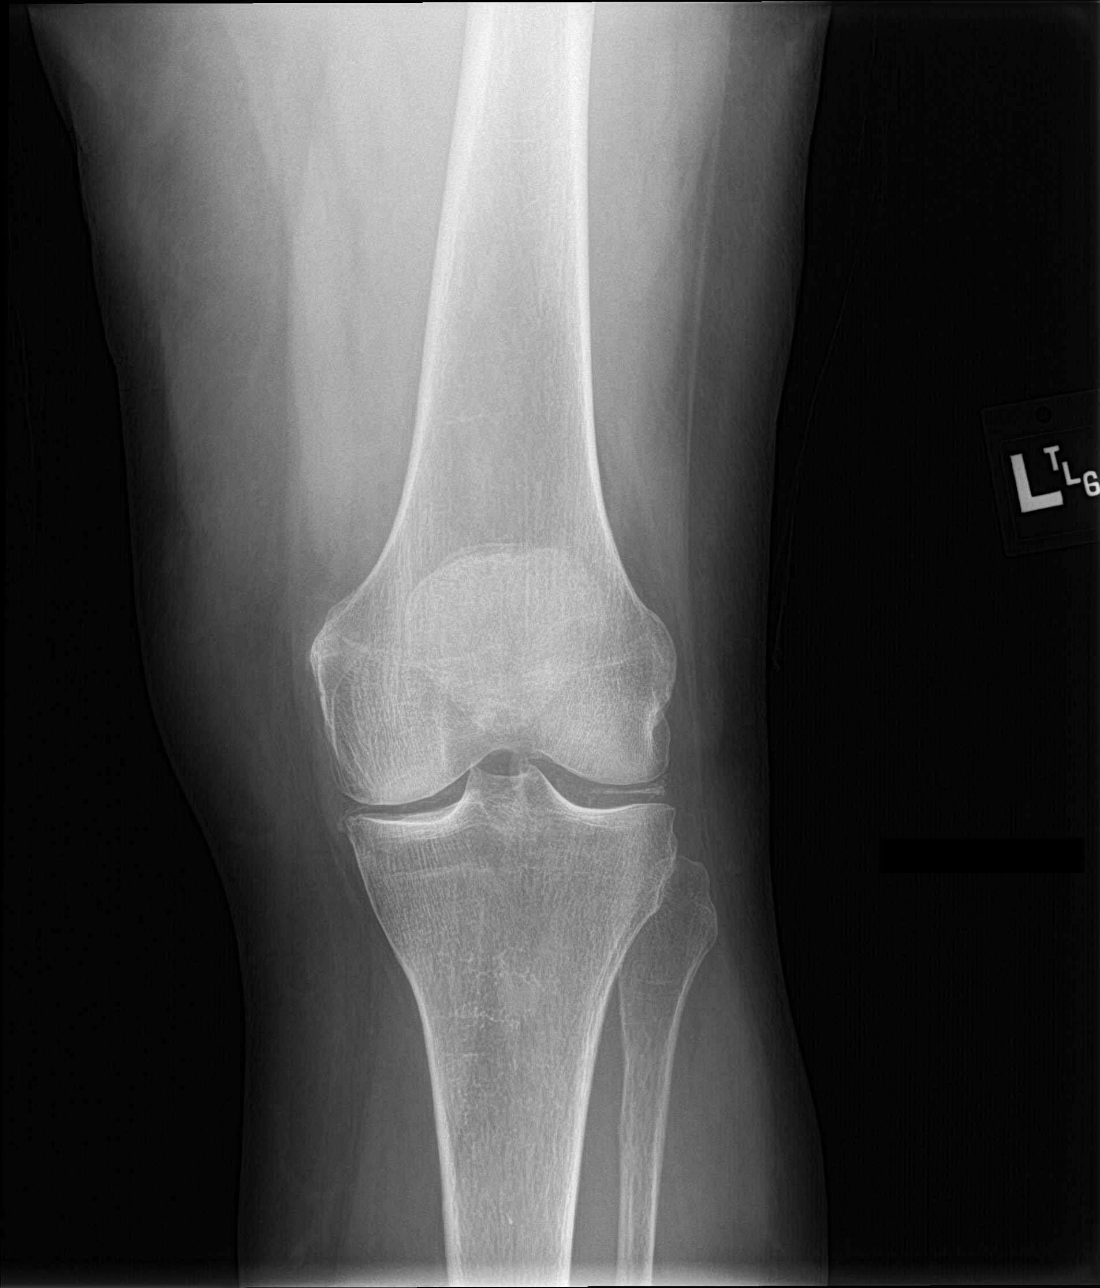

[knee lat]
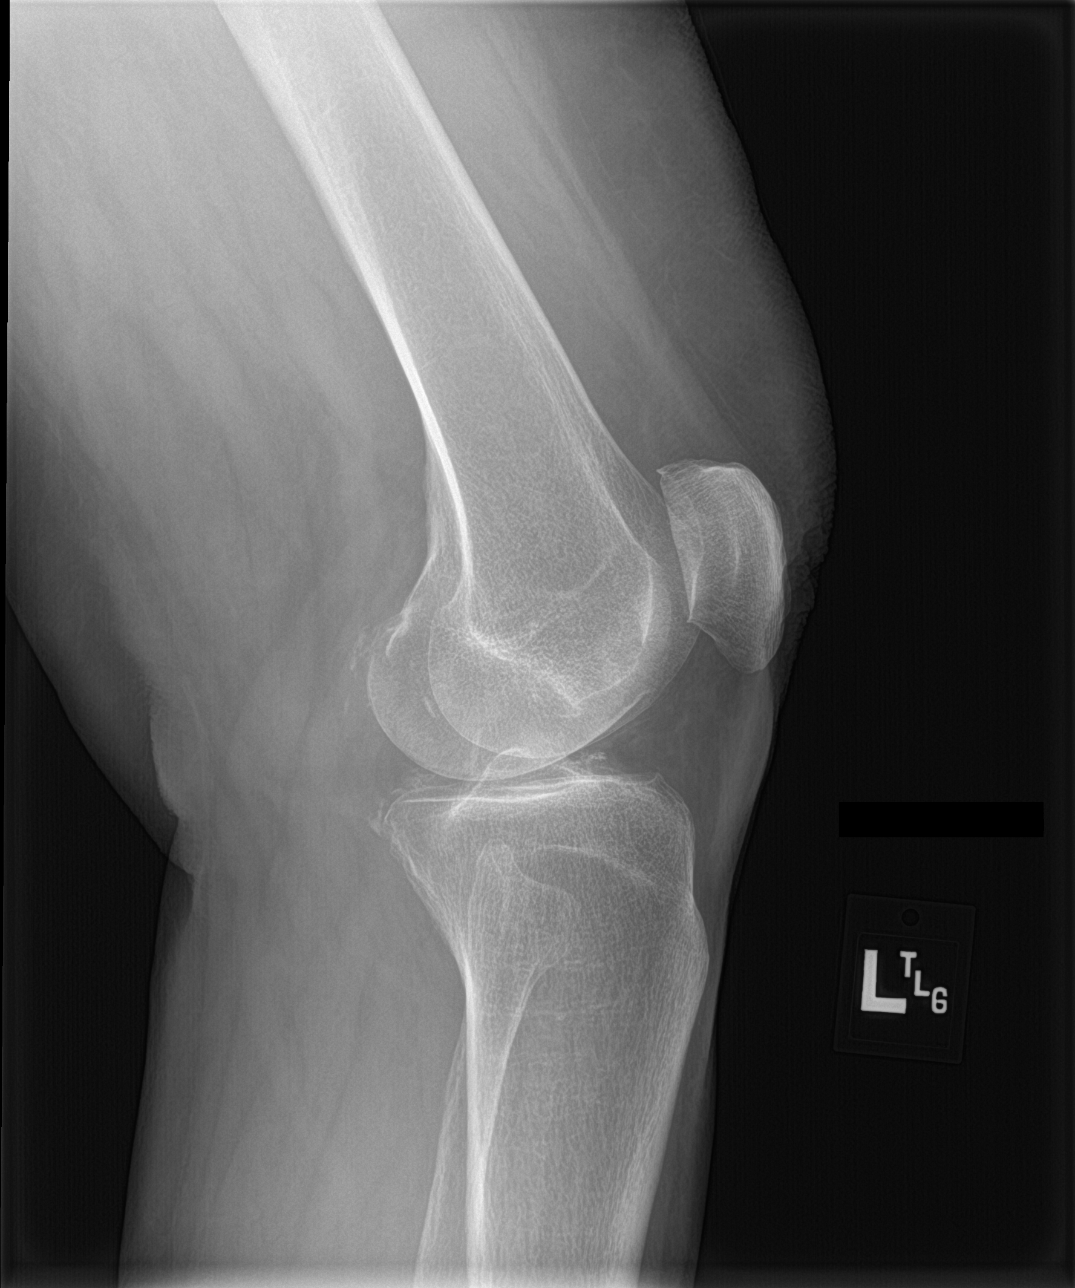

[knee sunrise]
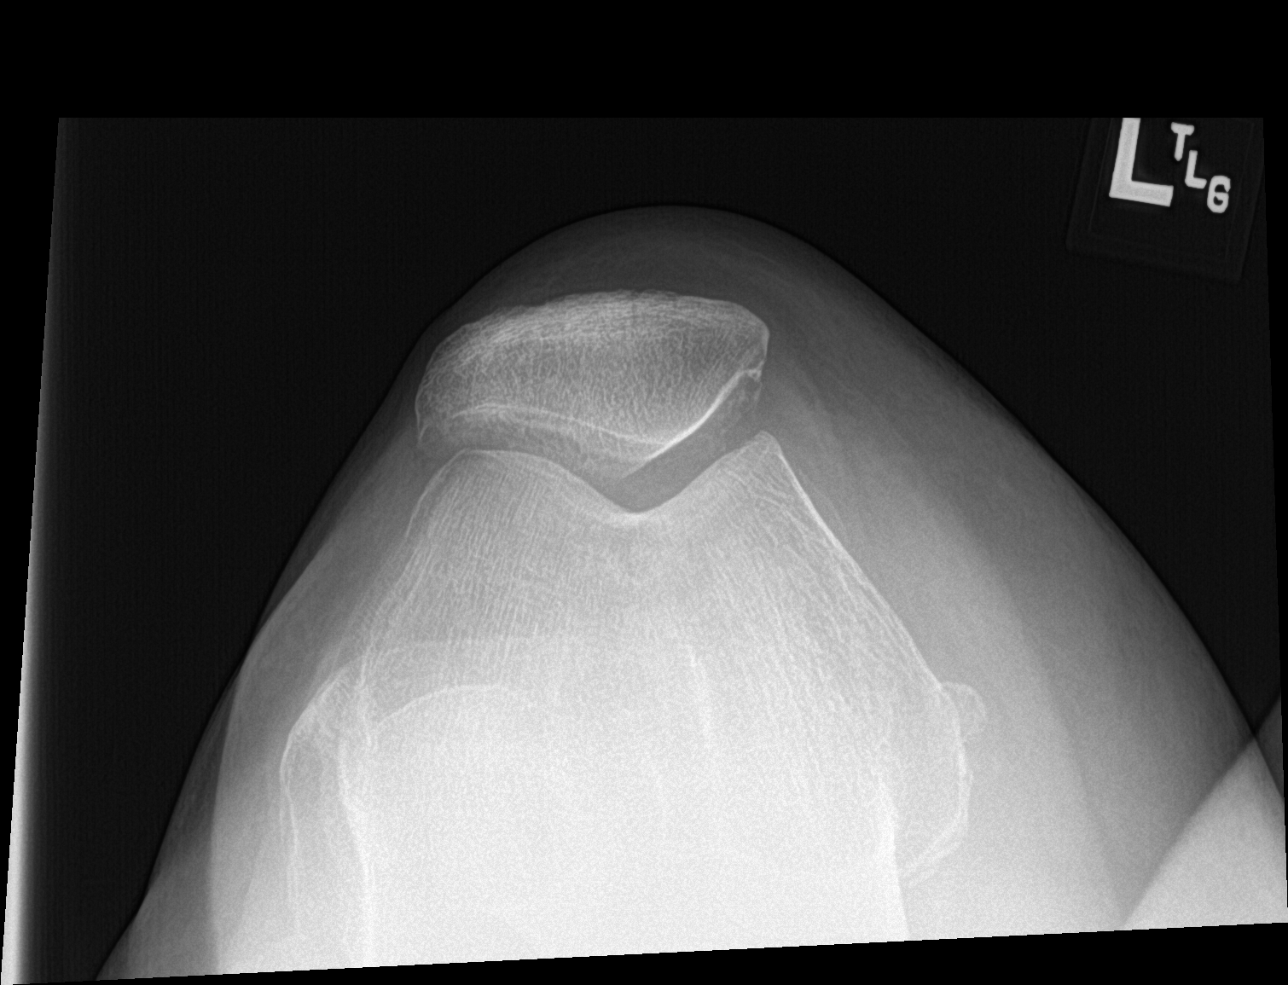

[4 of 4 positions shown; findings below may reference images not displayed]

FINDINGS: Osseous demineralization.

Joint space narrowing at medial compartment.

Chondrocalcinosis question CPPD.

No acute fracture, dislocation, or bone destruction.

No knee joint effusion.
IMPRESSION: Osseous demineralization with degenerative changes at medial
compartment and question CPPD.

## 2018-06-01 NOTE — Telephone Encounter (Signed)
Pt notified -EH/RMA  

## 2018-06-05 ENCOUNTER — Telehealth: Payer: Self-pay | Admitting: *Deleted

## 2018-06-05 NOTE — Telephone Encounter (Signed)
Verbal order given for one session of pt next week.

## 2018-06-08 ENCOUNTER — Telehealth: Payer: Self-pay

## 2018-06-08 NOTE — Telephone Encounter (Signed)
Pt enrolled in a health and wellness program with [facility] so that they can receive care from RN case manager.   [employer] requested for last couple office visits be sent over with a list of current medications.  I faxed last 2 office visit notes from [place] and also from Dr Georgina Snell along with a copy of pt's med list to Pietro Cassis, RNs attention to [phone]   Faxed request from [facility] has been scanned to pt's chart.

## 2018-06-10 ENCOUNTER — Ambulatory Visit: Payer: Medicare Other | Admitting: Sports Medicine

## 2018-06-10 ENCOUNTER — Encounter: Payer: Self-pay | Admitting: Sports Medicine

## 2018-06-10 DIAGNOSIS — M545 Low back pain: Secondary | ICD-10-CM | POA: Diagnosis not present

## 2018-06-10 DIAGNOSIS — M532X8 Spinal instabilities, sacral and sacrococcygeal region: Secondary | ICD-10-CM

## 2018-06-10 NOTE — Progress Notes (Signed)
Subjective:    CC: Back pain  HPI: This is a pleasant 77 year old female, she comes in with a long history of right-sided low back pain, feels as though there is popping and some movement, feels unstable.  Really does not have any pain anteriorly or in the groin.  Symptoms are moderate, persistent, localized without radiation, desires interventional treatment today.  I reviewed the past medical history, family history, social history, surgical history, and allergies today and no changes were needed.  Please see the problem list section below in epic for further details.  Past Medical History: Past Medical History:  Diagnosis Date  . Anxiety   . Back pain, chronic   . Breast cancer ([place])    left  . 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 GERD (gastroesophageal reflux disease)   . Hemorrhoid   . HSV infection   . Hypertension   . 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 IBS (irritable bowel syndrome)   . Migraine   . Sterile pyuria 03/06/2018  . Vertigo    Past Surgical History: Past Surgical History:  Procedure Laterality Date  . ABDOMINAL HYSTERECTOMY    . BREAST SURGERY    . HERNIA REPAIR    . KNEE ARTHROSCOPY Left   . LUMBAR DISC SURGERY    . MASTECTOMY Left   . TONSILLECTOMY    . TUBAL LIGATION     Social History: Social History   Socioeconomic History  . Marital status: Divorced    Spouse name: Not on file  . Number of children: 1  . Years of education: Not on file  . Highest education level: Not on file  Occupational History  . Not on file  Social Needs  . Financial resource strain: Not on file  . Food insecurity:    Worry: Not on file    Inability: Not on file  . Transportation needs:    Medical: Not on file    Non-medical: Not on file  Tobacco Use  . Smoking status: Never Smoker  . Smokeless tobacco: Never Used  Substance and Sexual Activity  . Alcohol use: No    Alcohol/week: 0.0 oz  . Drug use: No  . Sexual  activity: Not Currently  Lifestyle  . Physical activity:    Days per week: Not on file    Minutes per session: Not on file  . Stress: Not on file  Relationships  . Social connections:    Talks on phone: Not on file    Gets together: Not on file    Attends religious service: Not on file    Active member of club or organization: Not on file    Attends meetings of clubs or organizations: Not on file    Relationship status: Not on file  Other Topics Concern  . Not on file  Social History Narrative  . Not on file   Family History: Family History  Problem Relation Age of Onset  . Hypertension Mother   . Stroke Father    Allergies: Allergies  Allergen Reactions  . Cefuroxime Axetil Shortness Of Breath    Has had cephalexin (Keflex) without side effects  . Edarbi [Azilsartan] Other (See Comments)    Heart pounding, foot swelling  . Methylprednisolone Other (See Comments) and Hypertension    Raised blood pressure and sugar  . Nystop [Nystatin] Other (See Comments)    Burning skin and breathing problems  . Penicillins Other (See Comments)    Face broke out in lumps  . Red  Dye Itching, Rash and Other (See Comments)    Fainting  . Tamiflu [Oseltamivir Phosphate] Diarrhea and Other (See Comments)    Fainting  . Verelan [Verapamil Hcl Er] Shortness Of Breath  . Zithromax [Azithromycin] Shortness Of Breath  . Carbidopa-Levodopa Itching and Other (See Comments)    Itchy ears, throat and tongue   . Doxazosin Mesylate Other (See Comments)    Fast pulse  . Hyoscyamine Other (See Comments)    Shakes, dizzy and headache  . Influenza Vaccines Other (See Comments)    "head in a vice", nasal itching  . Other Other (See Comments)    Cardiolyte injection - burning through body  . Asa [Aspirin] Rash  . Atenolol Other (See Comments)    Unknown  . Avelox [Moxifloxacin] Other (See Comments)    Unknown  . Benicar [Olmesartan] Other (See Comments)    Unknown  . Benzonatate Other (See  Comments)    Unknown  . Bisoprolol Other (See Comments)    Unknown  . Cardizem [Diltiazem Hcl] Other (See Comments)    Unknown  . Carvedilol Other (See Comments)    Unknown  . Cefdinir Other (See Comments)    Unknown  . Chlorthalidone Other (See Comments)    Unknown  . Ciprofloxacin Other (See Comments)    Heartburn  . Citalopram Other (See Comments)    Unknown  . Clonidine Derivatives Other (See Comments)    Unknown  . Diovan [Valsartan] Other (See Comments)    Unknown  . Doxycycline Other (See Comments)    Headache  . Erythromycin Other (See Comments)    Unknown  . Fioricet [Butalbital-Apap-Caffeine] Other (See Comments)    Headache and dizzy  . Flexeril [Cyclobenzaprine] Other (See Comments)    Unknown  . Furosemide Other (See Comments)    Unknown  . Hydralazine Other (See Comments)    Headache  . Hydroxyzine Other (See Comments)    nightmares  . Isosorbide Other (See Comments)    Unknown  . Lyrica [Pregabalin] Other (See Comments)    Unknown  . Macrobid [employer] Macro] Other (See Comments)    Unknown  . Mavik [Trandolapril] Other (See Comments)    Unknown  . Metronidazole Itching and Other (See Comments)    Dizzy  . Naproxen Other (See Comments)    Unknown  . Neo-Polycin Hc [Bacitra-Neomycin-Polymyxin-Hc] Itching  . Nexium [Esomeprazole Magnesium] Other (See Comments)    Headache  . Norflex [Orphenadrine Citrate] Other (See Comments)  . Oxybutynin Chloride Er Other (See Comments)    Unknown  . Paxil [Paroxetine Hcl] Other (See Comments)    Unknown  . Phenazopyridine Other (See Comments)    Unknown  . Prazosin Other (See Comments)    Unknown  . Prednisone Other (See Comments)    Unknown  . Promethazine Other (See Comments)    Unknown  . Prozac [Fluoxetine Hcl] Other (See Comments)    Unknown  . Spironolactone Other (See Comments)    Unknown  . Sulfur Other (See Comments)    Unknown  . Tizanidine Other (See Comments)    Unknown  .  Valium [Diazepam] Other (See Comments)    "Too strong?"  . Zoloft [Sertraline Hcl] Other (See Comments)    Unknown   Medications: See med rec.  Review of Systems: No fevers, chills, night sweats, weight loss, chest pain, or shortness of breath.   Objective:    General: Well Developed, well nourished, and in no acute distress.  Neuro: Alert and oriented x3, extra-ocular  muscles intact, sensation grossly intact.  HEENT: Normocephalic, atraumatic, pupils equal round reactive to light, neck supple, no masses, no lymphadenopathy, thyroid nonpalpable.  Skin: Warm and dry, no rashes. Cardiac: Regular rate and rhythm, no murmurs rubs or gallops, no lower extremity edema.  Respiratory: Clear to auscultation bilaterally. Not using accessory muscles, speaking in full sentences. Back Exam:  Inspection: Unremarkable  Motion: Flexion 45 deg, Extension 45 deg, Side Bending to 45 deg bilaterally,  Rotation to 45 deg bilaterally  SLR laying: Negative  XSLR laying: Negative  Palpable tenderness: Right sacroiliac joint. FABER: negative. Sensory change: Gross sensation intact to all lumbar and sacral dermatomes.  Reflexes: 2+ at both patellar tendons, 2+ at achilles tendons, Babinski's downgoing.  Strength at foot  Plantar-flexion: 5/5 Dorsi-flexion: 5/5 Eversion: 5/5 Inversion: 5/5  Leg strength  Quad: 5/5 Hamstring: 5/5 Hip flexor: 5/5 Hip abductors: 5/5  Gait unremarkable. No pain with internal rotation of the right hip  X-rays personally reviewed and show degenerative changes of both sacroiliac joints  Procedure: Real-time Ultrasound Guided Injection of right sacroiliac joint Device: GE Logiq E  Verbal informed consent obtained.  Time-out conducted.  Noted no overlying erythema, induration, or other signs of local infection.  Skin prepped in a sterile fashion.  Local anesthesia: Topical Ethyl chloride.  With sterile technique and under real time ultrasound guidance: With the patient  standing, in a flexed position over the exam table, disposition was per her request, I guided a 22-gauge spinal needle into the sacroiliac joint easily and injected 1 cc kenalog 40, 2 cc lidocaine, 2 cc bupivacaine. Completed without difficulty  Pain immediately resolved suggesting accurate placement of the medication.  Advised to call if fevers/chills, erythema, induration, drainage, or persistent bleeding.  Images permanently stored and available for review in the ultrasound unit.  Impression: Technically successful ultrasound guided injection.  Impression and Recommendations:    Instability of right SI joint Injection as above per patient request. Return in 4 weeks. ___________________________________________ Gwen Her. Dianah Field, M.D., ABFM., CAQSM. Primary Care and [place] Instructor of [place] [facility]

## 2018-06-10 NOTE — Assessment & Plan Note (Signed)
Injection as above per patient request. Return in 4 weeks.

## 2018-06-12 ENCOUNTER — Telehealth: Payer: Self-pay

## 2018-06-12 DIAGNOSIS — M532X8 Spinal instabilities, sacral and sacrococcygeal region: Secondary | ICD-10-CM

## 2018-06-12 NOTE — Telephone Encounter (Signed)
Pt ok with physical therapy.  Advised her if she feels she has any issues over the weekend to go to Urgent Care

## 2018-06-12 NOTE — Telephone Encounter (Signed)
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  Aggressive physical therapy can strengthen the muscles that hold it in the joint however and decrease the sensation.  If she is okay with this then I will place the order.

## 2018-06-12 NOTE — Telephone Encounter (Signed)
Pt called stating that she was seen on Wednesday by Dr T and is not feeling any better. She received an injection at her visit, but states she is now afraid to walk or move because she feels her hip is continually coming out of socket. States that when she rolls over in bed, she can feel it coming out of socket and she is too afraid to try to get up and walk.   Pt wants to know if Dr T will order her some x-rays to be done, because she really feels that is what is needed.   Advised pt I would send msg to MD asking for x-rays to be ordered.

## 2018-06-12 NOTE — Telephone Encounter (Signed)
Is only been a couple of days, give it a few more days, she was probably feeling the effect of the numbing medicine, now it can take a couple of days for the steroid to start working.

## 2018-06-12 NOTE — Telephone Encounter (Signed)
Pt advised. States she is not in pain, she is just concerned because she states "you don't understand, it is physically coming out of socket and if something is not done then I am going to have surgery".  Pt states she is very unhappy that she cannot do anything nor move because she states she knows it is constantly coming out of socket.   Dr T, is there any advise I can give pt as to what to do. She denied any pain at all, just very concerned with hip seeming to slip from socket.Marland KitchenMarland Kitchen

## 2018-06-12 NOTE — Telephone Encounter (Signed)
Orders placed.

## 2018-06-13 ENCOUNTER — Encounter: Payer: Self-pay | Admitting: Emergency Medicine

## 2018-06-13 ENCOUNTER — Emergency Department (INDEPENDENT_AMBULATORY_CARE_PROVIDER_SITE_OTHER)
Admission: EM | Admit: 2018-06-13 | Discharge: 2018-06-13 | Disposition: A | Discharge status: Home / Self Care | Payer: Medicare Other | Source: Home / Self Care

## 2018-06-13 DIAGNOSIS — M25551 Pain in right hip: Secondary | ICD-10-CM | POA: Diagnosis not present

## 2018-06-13 NOTE — ED Triage Notes (Signed)
Patient presents to [facility] with C/O pain in the right hip for about 6 months with arthritis, and on June 4th she fell at [facility] an struck her right hip over a wooden bench. Rates pain sharp in nature 6/10 she has been seeing Dr. Darene Lamer and she states that she had an injection in her hip last Thursday.She request a hip x-ray.

## 2018-06-15 NOTE — ED Provider Notes (Signed)
[name]    CSN: [id number] Arrival date & time: 06/13/18  1642     History   Chief Complaint Chief Complaint  Patient presents with  . Hip Pain    HPI Dana Calderon is a 77 y.o. female presents with a complaint of her right hip popping out of socket when she lies on her left side.  Patient is requesting a x-ray today.  At present she is asymptomatic of pain.  However reports when she is lying either on her left or right side she feels that her right hip is popping out of socket and at that time she experiences pain.  She is followed by Dr. Dianah Field as this complaint has been a chronic ongoing issue.  She was seen by Dr.Thekkekandam  On July 10 and received an injection for management of her chronic hip pain.  She followed up and contacted his office on 06/12/2018 requesting an x-ray image of her right hip.  Dr.Thekkekandam did not refer for imaging  and recommended physical therapy.  According to the notes from PCP office patient was agreeable to physical therapy however presents today requesting an image.  She denies that the hip joint is popping out of socket when she is walking and reports this only occurs while lying down and she can feel the exposure of her hip bone.  She is ambulating today with a cane however displays no distress with ambulating nor is her gait asymmetric. Past Medical History:  Diagnosis Date  . Anxiety   . Back pain, chronic   . Breast cancer ([place])    left  . 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 GERD (gastroesophageal reflux disease)   . Hemorrhoid   . HSV infection   . Hypertension   . 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 IBS (irritable bowel syndrome)   . Migraine   . Sterile pyuria 03/06/2018  . Vertigo     Patient Active Problem List   Diagnosis Date Noted  . 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 Left-sided muscle weakness 05/08/2018  . Vaginal pruritus 05/08/2018  . Multiple  ecchymoses of thigh 05/08/2018  . Nocturnal leg cramps 05/08/2018  . Pancreatic cyst 04/06/2018  . LUQ pain 03/16/2018  . Abnormal CT of the abdomen 03/16/2018  . Dextroscoliosis 03/16/2018  . Hiatal hernia 03/15/2018  . Tinea pedis of right foot 03/15/2018  . Sterile pyuria 03/06/2018  . Chronic midline thoracic back pain 12/22/2017  . Refused pneumococcal vaccination 12/19/2017  . 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 Overactive bladder 12/19/2017  . Leukocytes in urine 12/19/2017  . Microscopic hematuria 12/19/2017  . Ambulates with cane 12/19/2017  . At moderate risk for fall 12/19/2017  . 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 Vulvar dermatitis 06/08/2017  . Pain of great toe, right 05/29/2017  . Cervical spondylosis 04/14/2017  . 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 Irritable bowel syndrome with diarrhea 04/03/2017  . 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 Left lumbar radiculitis 02/20/2017  . Intertrigo 01/06/2017  . Osteopenia 01/06/2017  . Palpitations 01/06/2017  . Abnormal ECG 01/06/2017  . Xerosis of skin 01/06/2017  . Onychomycosis 09/13/2016  . 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 Mouth lesion 06/30/2015  . Hypertension goal BP (blood pressure) < 140/90 05/30/2015  . Prediabetes 05/30/2015  . Seborrheic dermatitis 05/30/2015  Past Surgical History:  Procedure Laterality Date  . ABDOMINAL HYSTERECTOMY    . BREAST SURGERY    . HERNIA REPAIR    . KNEE ARTHROSCOPY Left   . LUMBAR DISC SURGERY    . MASTECTOMY Left   . TONSILLECTOMY    . TUBAL LIGATION      OB History    Gravida  1   Para  1   Term      Preterm      AB      Living        SAB      TAB      Ectopic      Multiple      Live Births  1            Home Medications     Prior to Admission medications   Medication Sig Start Date End Date Taking? Authorizing Provider  Acetaminophen (TYLENOL 8 HOUR ARTHRITIS PAIN PO) Take by mouth.    [provider]  AMBULATORY NON FORMULARY MEDICATION Knee-high, medium compression, graduated compression stockings. Apply to lower extremities. [email or web address], Zippered Compression Stockings, medium circ, long length 04/03/17   Nelson [employer]imes [employer] [place], [place]  AMBULATORY NON FORMULARY MEDICATION Rolling 4-point walker 05/23/17   Silverio Decamp, MD  amLODipine (NORVASC) 5 MG tablet TAKE ONE TABLET BY MOUTH EVERY DAY 02/05/18   Lelon Perla, MD  BOOSTRIX 5-2.5-18.5 LF-MCG/0.5 injection  12/19/17   [provider]  clobetasol cream (TEMOVATE) 7.37 % Apply 1 application topically 2 (two) times daily. 06/02/17   Trixie Dredge, PA-C  dicyclomine (BENTYL) 10 MG capsule Take 1 capsule (10 mg total) by mouth 4 (four) times daily as needed for spasms. 04/06/18   Trixie Dredge, PA-C  diphenoxylate-atropine (LOMOTIL) 2.5-0.025 MG tablet TAKE ONE TABLET BY MOUTH EVERY DAY AS NEEDED FOR DIARRHEA OR LOOSE STOOLS 02/02/18   Trixie Dredge, PA-C  lidocaine (XYLOCAINE) 5 % ointment Apply 1 application as needed topically. 10/07/17   Constant, Peggy, MD  lisinopril (PRINIVIL,ZESTRIL) 40 MG tablet Take 1 tablet (40 mg total) by mouth daily. 12/19/17   Trixie Dredge, PA-C  loratadine (CLARITIN) 10 MG tablet Take 1 tablet (10 mg total) by mouth daily. 07/04/16   Duffy Bruce, MD  Misc Natural Products (COSAMIN ASU ADVANCED FORMULA) CAPS Take 2 tablets by mouth 2 (two) times daily. 04/29/17   Silverio Decamp, MD  Multiple Minerals-Vitamins (CALCIUM-MAGNESIUM-ZINC-D3 PO) Take 1 tablet by mouth 2 (two) times daily.    [provider]  Multiple Vitamin (MULTIVITAMIN) tablet Take 1 tablet by mouth daily.    [provider]  Nebivolol HCl (BYSTOLIC) 20 MG  TABS Take 2 tablets (40 mg total) by mouth daily. 01/26/18   Trixie Dredge, PA-C  nystatin-triamcinolone ointment [facility]) Apply 1 application topically 2 (two) times daily. 09/10/17   Constant, Peggy, MD  omeprazole (PRILOSEC) 20 MG capsule Take 1 capsule (20 mg total) by mouth daily as needed (heartburn/indigestion). 04/07/18   Trixie Dredge, PA-C  OVER THE COUNTER MEDICATION Take 1 capsule by mouth daily. "Mega Red"    [provider]  Polyethylene Glycol 400 (BLINK TEARS OP) Place 1 drop into both eyes as needed (for dry eyes).    [provider]  rOPINIRole (REQUIP) 0.25 MG tablet TAKE ONE TABLET BY MOUTH THREE TIMES A DAY Patient taking differently: TAKE 1/2 TABLET BY MOUTH SIX TIMES A DAY 03/12/18   Trixie Dredge, PA-C  sodium chloride [facility])  0.65 % SOLN nasal spray Place 1 spray into both nostrils as needed for congestion. 07/04/16   Duffy Bruce, MD  valACYclovir (VALTREX) 500 MG tablet Take 1 tablet (500 mg total) by mouth daily. 03/04/18   Trixie Dredge, PA-C  Wheat Dextrin (BENEFIBER DRINK MIX PO) Take by mouth.    [provider]    Family History Family History  Problem Relation Age of Onset  . Hypertension Mother   . Stroke Father     Social History Social History   Tobacco Use  . Smoking status: Never Smoker  . Smokeless tobacco: Never Used  Substance Use Topics  . Alcohol use: No    Alcohol/week: 0.0 oz  . Drug use: No     Allergies   Cefuroxime axetil; Edarbi [azilsartan]; Methylprednisolone; Nystop [nystatin]; Penicillins; Red dye; Tamiflu [oseltamivir phosphate]; Verelan [verapamil hcl er]; Zithromax [azithromycin]; Carbidopa-levodopa; Doxazosin mesylate; Hyoscyamine; Influenza vaccines; Other; Asa [aspirin]; Atenolol; Avelox [moxifloxacin]; Benicar [olmesartan]; Benzonatate; Bisoprolol; Cardizem [diltiazem hcl]; Carvedilol; Cefdinir; Chlorthalidone; Ciprofloxacin; Citalopram;  Clonidine derivatives; Diovan [valsartan]; Doxycycline; Erythromycin; Fioricet [butalbital-apap-caffeine]; Flexeril [cyclobenzaprine]; Furosemide; Hydralazine; Hydroxyzine; Isosorbide; Lyrica [pregabalin]; Macrobid [nitrofurantoin monohyd macro]; Mavik [trandolapril]; Metronidazole; Naproxen; Neo-polycin hc [bacitra-neomycin-polymyxin-hc]; Nexium [esomeprazole magnesium]; Norflex [orphenadrine citrate]; Oxybutynin chloride er; Paxil [paroxetine hcl]; Phenazopyridine; Prazosin; Prednisone; Promethazine; Prozac [fluoxetine hcl]; Spironolactone; Sulfur; Tizanidine; Valium [diazepam]; and Zoloft [sertraline hcl]   Review of Systems Review of Systems Pertinent negatives listed in HPI  Physical Exam Triage Vital Signs ED Triage Vitals  Enc Vitals Group     BP 06/13/18 1718 (!) 166/80     Pulse Rate 06/13/18 1718 70     Resp 06/13/18 1718 16     Temp 06/13/18 1718 98.3 F (36.8 C)     Temp Source 06/13/18 1718 Oral     SpO2 06/13/18 1718 97 %     Weight 06/13/18 1719 183 lb 12 oz (83.3 kg)     Height 06/13/18 1719 5' 6.5" (1.689 m)     Head Circumference --      Peak Flow --      Pain Score 06/13/18 1718 6     Pain Loc --      Pain Edu? --      Excl. in [place]? --    No data found.  Updated Vital Signs BP (!) 166/80 (BP Location: Right Arm)   Pulse 70   Temp 98.3 F (36.8 C) (Oral)   Resp 16   Ht 5' 6.5" (1.689 m)   Wt 183 lb 12 oz (83.3 kg)   SpO2 97%   BMI 29.21 kg/m   Visual Acuity Right Eye Distance:   Left Eye Distance:   Bilateral Distance:    Right Eye Near:   Left Eye Near:    Bilateral Near:     Physical Exam  Constitutional: She is oriented to person, place, and time. She appears well-nourished.  Non-toxic appearance. She does not have a sickly appearance. She does not appear ill. No distress.  Cardiovascular: Normal rate and regular rhythm.  Pulmonary/Chest: Effort normal and breath sounds normal.  Musculoskeletal: She exhibits no edema.       Right hip: She  exhibits decreased strength. She exhibits no tenderness, no bony tenderness and no swelling.  Uses a mobility aid for ambulation -chronic hip pain  Neurological: She is alert and oriented to person, place, and time.  Skin: Skin is warm and dry.   UC Treatments / Results  Labs (all labs ordered are listed, but only abnormal  results are displayed) Labs Reviewed - No data to display  EKG None  Radiology No results found.  Procedures Procedures (including critical care time)  Medications Ordered in UC Medications - No data to display  Initial Impression / Assessment and Plan / UC Course  I have reviewed the triage vital signs and the nursing notes.  Pertinent labs & imaging results that were available during my care of the patient were reviewed by me and considered in my medical decision making (see chart for details).  Patient presents today requesting an x-ray which was recently advised by Dr. Dianah Field that imaging was not medically warranted. She received a recent injection of SI joint which she reports improved symptoms. Evaluated patient and was unable to reproduce symptoms of her right hip dislocating. Unable to reproduce pain. She is able to ambulate with her mobility aid without assistance and is not experiencing any acute distress. She has an upcoming appointment with her PCP in less than 7 days. I recommend discussing concerns with PCP and Dr. Dianah Field provider. She confirmed that she planned to proceed with physical therapy.  Final Clinical Impressions(s) / UC Diagnoses   Final diagnoses:  Right hip pain  Pain of right hip joint   Discharge Instructions   Follow-up with PCP and Physical Therapy    ED Prescriptions    None     Controlled Substance Prescriptions  Controlled Substance Registry consulted? Not Applicable   Scot Jun, FNP 06/15/18 1434

## 2018-06-18 ENCOUNTER — Ambulatory Visit: Payer: Medicare Other | Admitting: Physician Assistant

## 2018-06-18 ENCOUNTER — Ambulatory Visit: Payer: Medicare Other | Admitting: Obstetrics & Gynecology

## 2018-06-21 ENCOUNTER — Other Ambulatory Visit: Payer: Self-pay

## 2018-06-21 ENCOUNTER — Emergency Department (INDEPENDENT_AMBULATORY_CARE_PROVIDER_SITE_OTHER)
Admission: EM | Admit: 2018-06-21 | Discharge: 2018-06-21 | Disposition: A | Discharge status: Home / Self Care | Payer: Medicare Other | Source: Home / Self Care | Attending: Family Medicine | Admitting: Family Medicine

## 2018-06-21 DIAGNOSIS — N3000 Acute cystitis without hematuria: Secondary | ICD-10-CM | POA: Diagnosis not present

## 2018-06-21 DIAGNOSIS — R319 Hematuria, unspecified: Secondary | ICD-10-CM

## 2018-06-21 DIAGNOSIS — N390 Urinary tract infection, site not specified: Secondary | ICD-10-CM

## 2018-06-21 LAB — POCT URINALYSIS DIP (MANUAL ENTRY)
Bilirubin, UA: NEGATIVE
GLUCOSE UA: NEGATIVE mg/dL
Nitrite, UA: NEGATIVE
Protein Ur, POC: NEGATIVE mg/dL
SPEC GRAV UA: 1.025 (ref 1.010–1.025)
Urobilinogen, UA: 0.2 E.U./dL
pH, UA: 5.5 (ref 5.0–8.0)

## 2018-06-21 MED ORDER — CEPHALEXIN 500 MG PO CAPS: 500.0000 mg | ORAL_CAPSULE | Freq: Two times a day (BID) | ORAL | 0 refills | Status: DC

## 2018-06-21 NOTE — ED Triage Notes (Signed)
Pt c/o urinary symptoms, feverish feeling, shaky since Thurs. Having to urinate frequently and urgently.

## 2018-06-21 NOTE — Discharge Instructions (Addendum)
Increase fluid intake. °If symptoms become significantly worse during the night or over the weekend, proceed to the local emergency room.  °

## 2018-06-21 NOTE — ED Provider Notes (Signed)
[name]    CSN: [id number] Arrival date & time: 06/21/18  1605     History   Chief Complaint Chief Complaint  Patient presents with  . Urinary Tract Infection    HPI Dana Calderon is a 77 y.o. female.   Patient complains of four day history of urinary urgency, frequency, fatigue, and possible fever.  The history is provided by the patient.  Urinary Frequency  This is a new problem. Episode onset: 4 days ago. The problem occurs constantly. The problem has been gradually worsening. Associated symptoms comments: urgency. Nothing aggravates the symptoms. Nothing relieves the symptoms. She has tried nothing for the symptoms.    Past Medical History:  Diagnosis Date  . Anxiety   . Back pain, chronic   . Breast cancer ([place])    left  . 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 GERD (gastroesophageal reflux disease)   . Hemorrhoid   . HSV infection   . Hypertension   . 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 IBS (irritable bowel syndrome)   . Migraine   . Sterile pyuria 03/06/2018  . Vertigo     Patient Active Problem List   Diagnosis Date Noted  . 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 Left-sided muscle weakness 05/08/2018  . Vaginal pruritus 05/08/2018  . Multiple ecchymoses of thigh 05/08/2018  . Nocturnal leg cramps 05/08/2018  . Pancreatic cyst 04/06/2018  . LUQ pain 03/16/2018  . Abnormal CT of the abdomen 03/16/2018  . Dextroscoliosis 03/16/2018  . Hiatal hernia 03/15/2018  . Tinea pedis of right foot 03/15/2018  . Sterile pyuria 03/06/2018  . Chronic midline thoracic back pain 12/22/2017  . Refused pneumococcal vaccination 12/19/2017  . 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 Overactive bladder 12/19/2017  . Leukocytes in urine 12/19/2017  . Microscopic hematuria 12/19/2017  . Ambulates with cane 12/19/2017  . At moderate risk for fall 12/19/2017  . Hypertriglyceridemia without  hypercholesterolemia 12/18/2017  . 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 Vulvar dermatitis 06/08/2017  . Pain of great toe, right 05/29/2017  . Cervical spondylosis 04/14/2017  . 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 Irritable bowel syndrome with diarrhea 04/03/2017  . 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 Left lumbar radiculitis 02/20/2017  . Intertrigo 01/06/2017  . Osteopenia 01/06/2017  . Palpitations 01/06/2017  . Abnormal ECG 01/06/2017  . Xerosis of skin 01/06/2017  . Onychomycosis 09/13/2016  . 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 Mouth lesion 06/30/2015  . Hypertension goal BP (blood pressure) < 140/90 05/30/2015  . Prediabetes 05/30/2015  . Seborrheic dermatitis 05/30/2015    Past Surgical History:  Procedure Laterality Date  . ABDOMINAL HYSTERECTOMY    . BREAST SURGERY    . HERNIA REPAIR    . KNEE ARTHROSCOPY Left   . LUMBAR DISC SURGERY    . MASTECTOMY Left   . TONSILLECTOMY    . TUBAL LIGATION      OB History    Gravida  1   Para  1   Term      Preterm      AB      Living        SAB      TAB      Ectopic      Multiple      Live Births  1  Home Medications    Prior to Admission medications   Medication Sig Start Date End Date Taking? Authorizing Provider  Acetaminophen (TYLENOL 8 HOUR ARTHRITIS PAIN PO) Take by mouth.    [provider]  AMBULATORY NON FORMULARY MEDICATION Knee-high, medium compression, graduated compression stockings. Apply to lower extremities. [email or web address], Zippered Compression Stockings, medium circ, long length 04/03/17   Nelson Chimes [place], [place]  AMBULATORY NON FORMULARY MEDICATION Rolling 4-point walker 05/23/17   Silverio Decamp, MD  amLODipine (NORVASC) 5 MG tablet TAKE ONE TABLET BY MOUTH EVERY DAY 02/05/18   Lelon Perla, MD  BOOSTRIX  5-2.5-18.5 LF-MCG/0.5 injection  12/19/17   [provider]  cephALEXin (KEFLEX) 500 MG capsule Take 1 capsule (500 mg total) by mouth 2 (two) times daily. 06/21/18   Kandra Nicolas, MD  clobetasol cream (TEMOVATE) 2.37 % Apply 1 application topically 2 (two) times daily. 06/02/17   Trixie Dredge, PA-C  dicyclomine (BENTYL) 10 MG capsule Take 1 capsule (10 mg total) by mouth 4 (four) times daily as needed for spasms. 04/06/18   Trixie Dredge, PA-C  diphenoxylate-atropine (LOMOTIL) 2.5-0.025 MG tablet TAKE ONE TABLET BY MOUTH EVERY DAY AS NEEDED FOR DIARRHEA OR LOOSE STOOLS 02/02/18   Trixie Dredge, PA-C  lidocaine (XYLOCAINE) 5 % ointment Apply 1 application as needed topically. 10/07/17   Constant, Peggy, MD  lisinopril (PRINIVIL,ZESTRIL) 40 MG tablet Take 1 tablet (40 mg total) by mouth daily. 12/19/17   Trixie Dredge, PA-C  loratadine (CLARITIN) 10 MG tablet Take 1 tablet (10 mg total) by mouth daily. 07/04/16   Duffy Bruce, MD  Misc Natural Products (COSAMIN ASU ADVANCED FORMULA) CAPS Take 2 tablets by mouth 2 (two) times daily. 04/29/17   Silverio Decamp, MD  Multiple Minerals-Vitamins (CALCIUM-MAGNESIUM-ZINC-D3 PO) Take 1 tablet by mouth 2 (two) times daily.    [provider]  Multiple Vitamin (MULTIVITAMIN) tablet Take 1 tablet by mouth daily.    [provider]  Nebivolol HCl (BYSTOLIC) 20 MG TABS Take 2 tablets (40 mg total) by mouth daily. 01/26/18   Trixie Dredge, PA-C  nystatin-triamcinolone ointment [facility]) Apply 1 application topically 2 (two) times daily. 09/10/17   Constant, Peggy, MD  omeprazole (PRILOSEC) 20 MG capsule Take 1 capsule (20 mg total) by mouth daily as needed (heartburn/indigestion). 04/07/18   Trixie Dredge, PA-C  OVER THE COUNTER MEDICATION Take 1 capsule by mouth daily. "Mega Red"    [provider]  Polyethylene Glycol 400 (BLINK TEARS OP) Place 1  drop into both eyes as needed (for dry eyes).    [provider]  rOPINIRole (REQUIP) 0.25 MG tablet TAKE ONE TABLET BY MOUTH THREE TIMES A DAY Patient taking differently: TAKE 1/2 TABLET BY MOUTH SIX TIMES A DAY 03/12/18   Trixie Dredge, PA-C  sodium chloride (OCEAN) 0.65 % SOLN nasal spray Place 1 spray into both nostrils as needed for congestion. 07/04/16   Duffy Bruce, MD  valACYclovir (VALTREX) 500 MG tablet Take 1 tablet (500 mg total) by mouth daily. 03/04/18   Trixie Dredge, PA-C  Wheat Dextrin (BENEFIBER DRINK MIX PO) Take by mouth.    [provider]    Family History Family History  Problem Relation Age of Onset  . Hypertension Mother   . Stroke Father     Social History Social History   Tobacco Use  . Smoking status: Never Smoker  . Smokeless tobacco: Never Used  Substance Use Topics  . Alcohol  use: No    Alcohol/week: 0.0 oz  . Drug use: No     Allergies   Cefuroxime axetil; Edarbi [azilsartan]; Methylprednisolone; Nystop [nystatin]; Penicillins; Red dye; Tamiflu [oseltamivir phosphate]; Verelan [verapamil hcl er]; Zithromax [azithromycin]; Carbidopa-levodopa; Doxazosin mesylate; Hyoscyamine; Influenza vaccines; Other; Asa [aspirin]; Atenolol; Avelox [moxifloxacin]; Benicar [olmesartan]; Benzonatate; Bisoprolol; Cardizem [diltiazem hcl]; Carvedilol; Cefdinir; Chlorthalidone; Ciprofloxacin; Citalopram; Clonidine derivatives; Diovan [valsartan]; Doxycycline; Erythromycin; Fioricet [butalbital-apap-caffeine]; Flexeril [cyclobenzaprine]; Furosemide; Hydralazine; Hydroxyzine; Isosorbide; Lyrica [pregabalin]; Macrobid [nitrofurantoin monohyd macro]; Mavik [trandolapril]; Metronidazole; Naproxen; Neo-polycin hc [bacitra-neomycin-polymyxin-hc]; Nexium [esomeprazole magnesium]; Norflex [orphenadrine citrate]; Oxybutynin chloride er; Paxil [paroxetine hcl]; Phenazopyridine; Prazosin; Prednisone; Promethazine; Prozac [fluoxetine hcl];  Spironolactone; Sulfur; Tizanidine; Valium [diazepam]; and Zoloft [sertraline hcl]   Review of Systems Review of Systems  Genitourinary: Positive for frequency.  All other systems reviewed and are negative.    Physical Exam Triage Vital Signs ED Triage Vitals  Enc Vitals Group     BP 06/21/18 1733 119/84     Pulse Rate 06/21/18 1733 67     Resp --      Temp 06/21/18 1733 98.3 F (36.8 C)     Temp Source 06/21/18 1733 Oral     SpO2 06/21/18 1733 97 %     Weight 06/21/18 1735 186 lb (84.4 kg)     Height 06/21/18 1735 5\' 6"  (1.676 m)     Head Circumference --      Peak Flow --      Pain Score 06/21/18 1735 0     Pain Loc --      Pain Edu? --      Excl. in [place]? --    No data found.  Updated Vital Signs BP 119/84 (BP Location: Right Arm)   Pulse 67   Temp 98.3 F (36.8 C) (Oral)   Ht 5\' 6"  (1.676 m)   Wt 186 lb (84.4 kg)   SpO2 97%   BMI 30.02 kg/m   Visual Acuity Right Eye Distance:   Left Eye Distance:   Bilateral Distance:    Right Eye Near:   Left Eye Near:    Bilateral Near:     Physical Exam Nursing notes and Vital Signs reviewed. Appearance:  Patient appears stated age, and in no acute distress.    Eyes:  Pupils are equal, round, and reactive to light and accomodation.  Extraocular movement is intact.  Conjunctivae are not inflamed   Pharynx:  Normal; moist mucous membranes  Neck:  Supple.  No adenopathy Lungs:  Clear to auscultation.  Breath sounds are equal.  Moving air well. Heart:  Regular rate and rhythm without murmurs, rubs, or gallops.  Abdomen:  Nontender without masses or hepatosplenomegaly.  Bowel sounds are present.  No CVA or flank tenderness.  Extremities:  No edema.  Skin:  No rash present.     UC Treatments / Results  Labs (all labs ordered are listed, but only abnormal results are displayed) Labs Reviewed  POCT URINALYSIS DIP (MANUAL ENTRY) - Abnormal; Notable for the following components:      Result Value   Ketones, POC UA  small (15) (*)    Blood, UA trace-intact (*)    Leukocytes, UA Small (1+) (*)    All other components within normal limits    EKG None  Radiology No results found.  Procedures Procedures (including critical care time)  Medications Ordered in UC Medications - No data to display  Initial Impression / Assessment and Plan / UC Course  I have reviewed the triage  vital signs and the nursing notes.  Pertinent labs & imaging results that were available during my care of the patient were reviewed by me and considered in my medical decision making (see chart for details).    Urine culture pending. Patient reports that she can take Keflex without adverse effects. Followup with Family Doctor if not improved in about 5 to 6 days.   Final Clinical Impressions(s) / UC Diagnoses   Final diagnoses:  Acute cystitis without hematuria     Discharge Instructions     Increase fluid intake. If symptoms become significantly worse during the night or over the weekend, proceed to the local emergency room.     ED Prescriptions    Medication Sig Dispense Auth. Provider   cephALEXin (KEFLEX) 500 MG capsule Take 1 capsule (500 mg total) by mouth 2 (two) times daily. 14 capsule Kandra Nicolas, MD        Kandra Nicolas, MD 06/23/18 [phone]

## 2018-06-22 ENCOUNTER — Telehealth: Payer: Self-pay

## 2018-06-22 LAB — URINE CULTURE
MICRO NUMBER:: 90863344
RESULT: NO GROWTH
SPECIMEN QUALITY:: ADEQUATE

## 2018-06-22 NOTE — Telephone Encounter (Signed)
Spoke with patient, feeling better.  Gave results of cx, and will follow up as needed.

## 2018-06-23 ENCOUNTER — Ambulatory Visit: Payer: Medicare Other | Admitting: Obstetrics & Gynecology

## 2018-06-26 ENCOUNTER — Other Ambulatory Visit: Payer: Self-pay | Admitting: Physician Assistant

## 2018-06-26 DIAGNOSIS — I10 Essential (primary) hypertension: Secondary | ICD-10-CM

## 2018-06-30 ENCOUNTER — Other Ambulatory Visit: Payer: Self-pay | Admitting: Sports Medicine

## 2018-06-30 DIAGNOSIS — M532X8 Spinal instabilities, sacral and sacrococcygeal region: Secondary | ICD-10-CM

## 2018-07-09 DIAGNOSIS — W19XXXA Unspecified fall, initial encounter: Secondary | ICD-10-CM | POA: Insufficient documentation

## 2018-07-10 ENCOUNTER — Ambulatory Visit: Payer: Medicare Other | Admitting: Sports Medicine

## 2018-07-10 ENCOUNTER — Encounter: Payer: Self-pay | Admitting: Family Medicine

## 2018-07-10 ENCOUNTER — Ambulatory Visit (INDEPENDENT_AMBULATORY_CARE_PROVIDER_SITE_OTHER): Payer: Medicare Other | Admitting: Family Medicine

## 2018-07-10 VITALS — BP 151/50 | HR 51 | Ht 67.0 in | Wt 186.0 lb

## 2018-07-10 DIAGNOSIS — M545 Low back pain, unspecified: Secondary | ICD-10-CM

## 2018-07-10 DIAGNOSIS — Z9181 History of falling: Secondary | ICD-10-CM

## 2018-07-10 DIAGNOSIS — M542 Cervicalgia: Secondary | ICD-10-CM | POA: Diagnosis not present

## 2018-07-10 DIAGNOSIS — N3941 Urge incontinence: Secondary | ICD-10-CM

## 2018-07-10 DIAGNOSIS — R251 Tremor, unspecified: Secondary | ICD-10-CM | POA: Diagnosis not present

## 2018-07-10 MED ORDER — MECLIZINE HCL 12.5 MG PO TABS: 12.5000 mg | ORAL_TABLET | Freq: Three times a day (TID) | ORAL | 0 refills | Status: AC | PRN

## 2018-07-10 NOTE — Progress Notes (Signed)
Subjective:    Patient ID: Dana Calderon, female    DOB: 28-Jun-1941, 77 y.o.   MRN: [id number]  HPI 77 year old female comes in today after recent fall.  She actually fell in the middle the night while going to the bathroom around 1 AM.  She reports losing her balance and hitting her back against the toilet seat.  She specifically had back and left hip pain.  She denies hitting her head or losing consciousness.  She went to the local emergency department at [facility].  Did have an x-ray of her low back and her cervical spine showing no evidence of fracture or subluxation.  They also did a rib film which was negative for fracture.  Hip film was also negative for fracture but did show some degenerative joint disease in both hip joints.  She still feels a little dizzy and off.  She says she is having a lot of pain particularly on the right side of her neck.  She says she thinks she is out of her meclizine would like a refill on that.  She also wanted to discuss her frequent bladder and yeast infections.  In fact she says her last bladder infection was about 2 weeks ago but she feels like that is a little bit better.  Unfortunately though she is been having some urgency and incontinence at night for several months and has been working with Eduard Clos, her provider was on this issue.  They have discussed the possibility of Myrbetrek but she has not tried it yet.  We do not have samples for her to try.  He says typically when she gets up in the middle the night something about sitting up and standing up causes her to suddenly feel like she needs to go to the bathroom to the point that she will actually have complete incontinence.  In fact her trying to get to the bathroom was how she fell.  She now has a potty chair in her room that a friend was able to get for her for free.  Review of Systems  BP (!) 151/50   Pulse (!) 51   Ht 5\' 7"  (1.702 m)   Wt 186 lb (84.4 kg)   SpO2 98%   BMI 29.13 kg/m      Allergies  Allergen Reactions  . Cefuroxime Axetil Shortness Of Breath    Has had cephalexin (Keflex) without side effects  . Edarbi [Azilsartan] Other (See Comments)    Heart pounding, foot swelling  . Methylprednisolone Other (See Comments) and Hypertension    Raised blood pressure and sugar  . Nystop [Nystatin] Other (See Comments)    Burning skin and breathing problems  . Penicillins Other (See Comments)    Face broke out in lumps  . Red Dye Itching, Rash and Other (See Comments)    Fainting  . Tamiflu [Oseltamivir Phosphate] Diarrhea and Other (See Comments)    Fainting  . Verelan [Verapamil Hcl Er] Shortness Of Breath  . Zithromax [Azithromycin] Shortness Of Breath  . Carbidopa-Levodopa Itching and Other (See Comments)    Itchy ears, throat and tongue   . Doxazosin Mesylate Other (See Comments)    Fast pulse  . Hyoscyamine Other (See Comments)    Shakes, dizzy and headache  . Influenza Vaccines Other (See Comments)    "head in a vice", nasal itching  . Other Other (See Comments)    Cardiolyte injection - burning through body  . Asa [Aspirin] Rash  . Atenolol  Other (See Comments)    Unknown  . Avelox [Moxifloxacin] Other (See Comments)    Unknown  . Benicar [Olmesartan] Other (See Comments)    Unknown  . Benzonatate Other (See Comments)    Unknown  . Bisoprolol Other (See Comments)    Unknown  . Cardizem [Diltiazem Hcl] Other (See Comments)    Unknown  . Carvedilol Other (See Comments)    Unknown  . Cefdinir Other (See Comments)    Unknown  . Chlorthalidone Other (See Comments)    Unknown  . Ciprofloxacin Other (See Comments)    Heartburn  . Citalopram Other (See Comments)    Unknown  . Clonidine Derivatives Other (See Comments)    Unknown  . Diovan [Valsartan] Other (See Comments)    Unknown  . Doxycycline Other (See Comments)    Headache  . Erythromycin Other (See Comments)    Unknown  . Fioricet [Butalbital-Apap-Caffeine] Other (See Comments)     Headache and dizzy  . Flexeril [Cyclobenzaprine] Other (See Comments)    Unknown  . Furosemide Other (See Comments)    Unknown  . Hydralazine Other (See Comments)    Headache  . Hydroxyzine Other (See Comments)    nightmares  . Isosorbide Other (See Comments)    Unknown  . Lyrica [Pregabalin] Other (See Comments)    Unknown  . Macrobid [employer] Macro] Other (See Comments)    Unknown  . Mavik [Trandolapril] Other (See Comments)    Unknown  . Metronidazole Itching and Other (See Comments)    Dizzy  . Naproxen Other (See Comments)    Unknown  . Neo-Polycin Hc [Bacitra-Neomycin-Polymyxin-Hc] Itching  . Nexium [Esomeprazole Magnesium] Other (See Comments)    Headache  . Norflex [Orphenadrine Citrate] Other (See Comments)  . Oxybutynin Chloride Er Other (See Comments)    Unknown  . Paxil [Paroxetine Hcl] Other (See Comments)    Unknown  . Phenazopyridine Other (See Comments)    Unknown  . Prazosin Other (See Comments)    Unknown  . Prednisone Other (See Comments)    Unknown  . Promethazine Other (See Comments)    Unknown  . Prozac [Fluoxetine Hcl] Other (See Comments)    Unknown  . Spironolactone Other (See Comments)    Unknown  . Sulfur Other (See Comments)    Unknown  . Tizanidine Other (See Comments)    Unknown  . Valium [Diazepam] Other (See Comments)    "Too strong?"  . Zoloft [Sertraline Hcl] Other (See Comments)    Unknown    Past Medical History:  Diagnosis Date  . Anxiety   . Back pain, chronic   . Breast cancer ([place])    left  . 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 GERD (gastroesophageal reflux disease)   . Hemorrhoid   . HSV infection   . Hypertension   . 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 IBS (irritable bowel syndrome)   . Migraine   . Sterile pyuria 03/06/2018  . Vertigo     Past Surgical History:  Procedure Laterality Date  . ABDOMINAL HYSTERECTOMY    . BREAST SURGERY    . HERNIA  REPAIR    . KNEE ARTHROSCOPY Left   . LUMBAR DISC SURGERY    . MASTECTOMY Left   . TONSILLECTOMY    . TUBAL LIGATION      Social History   Socioeconomic History  . Marital status: Divorced    Spouse name: Not on file  . Number of children: 1  .  Years of education: Not on file  . Highest education level: Not on file  Occupational History  . Not on file  Social Needs  . Financial resource strain: Not on file  . Food insecurity:    Worry: Not on file    Inability: Not on file  . Transportation needs:    Medical: Not on file    Non-medical: Not on file  Tobacco Use  . Smoking status: Never Smoker  . Smokeless tobacco: Never Used  Substance and Sexual Activity  . Alcohol use: No    Alcohol/week: 0.0 standard drinks  . Drug use: No  . Sexual activity: Not Currently  Lifestyle  . Physical activity:    Days per week: Not on file    Minutes per session: Not on file  . Stress: Not on file  Relationships  . Social connections:    Talks on phone: Not on file    Gets together: Not on file    Attends religious service: Not on file    Active member of club or organization: Not on file    Attends meetings of clubs or organizations: Not on file    Relationship status: Not on file  . Intimate partner violence:    Fear of current or ex partner: Not on file    Emotionally abused: Not on file    Physically abused: Not on file    Forced sexual activity: Not on file  Other Topics Concern  . Not on file  Social History Narrative  . Not on file    Family History  Problem Relation Age of Onset  . Hypertension Mother   . Stroke Father     Outpatient Encounter Medications as of 07/10/2018  Medication Sig  . Acetaminophen (TYLENOL 8 HOUR ARTHRITIS PAIN PO) Take by mouth.  Marland Kitchen amLODipine (NORVASC) 5 MG tablet TAKE ONE TABLET BY MOUTH EVERY DAY  . clobetasol cream (TEMOVATE) 0.08 % Apply 1 application topically 2 (two) times daily.  Marland Kitchen dicyclomine (BENTYL) 10 MG capsule Take 1  capsule (10 mg total) by mouth 4 (four) times daily as needed for spasms.  . diphenoxylate-atropine (LOMOTIL) 2.5-0.025 MG tablet TAKE ONE TABLET BY MOUTH EVERY DAY AS NEEDED FOR DIARRHEA OR LOOSE STOOLS  . lidocaine (XYLOCAINE) 5 % ointment Apply 1 application as needed topically.  Marland Kitchen lisinopril (PRINIVIL,ZESTRIL) 40 MG tablet Take 1 tablet (40 mg total) by mouth daily.  Marland Kitchen loratadine (CLARITIN) 10 MG tablet Take 1 tablet (10 mg total) by mouth daily.  . Misc Natural Products (COSAMIN ASU ADVANCED FORMULA) CAPS Take 2 tablets by mouth 2 (two) times daily.  . Multiple Minerals-Vitamins (CALCIUM-MAGNESIUM-ZINC-D3 PO) Take 1 tablet by mouth 2 (two) times daily.  . Multiple Vitamin (MULTIVITAMIN) tablet Take 1 tablet by mouth daily.  . Nebivolol HCl (BYSTOLIC) 20 MG TABS Take 2 tablets (40 mg total) by mouth daily.  Marland Kitchen nystatin-triamcinolone ointment (MYCOLOG) Apply 1 application topically 2 (two) times daily.  Marland Kitchen omeprazole (PRILOSEC) 20 MG capsule Take 1 capsule (20 mg total) by mouth daily as needed (heartburn/indigestion).  Marland Kitchen OVER THE COUNTER MEDICATION Take 1 capsule by mouth daily. "Mega Red"  . Polyethylene Glycol 400 (BLINK TEARS OP) Place 1 drop into both eyes as needed (for dry eyes).  Marland Kitchen rOPINIRole (REQUIP) 0.25 MG tablet TAKE ONE TABLET BY MOUTH THREE TIMES A DAY (Patient taking differently: TAKE 1/2 TABLET BY MOUTH SIX TIMES A DAY)  . sodium chloride (OCEAN) 0.65 % SOLN nasal spray Place 1 spray into  both nostrils as needed for congestion.  . valACYclovir (VALTREX) 500 MG tablet Take 1 tablet (500 mg total) by mouth daily.  . Wheat Dextrin (BENEFIBER DRINK MIX PO) Take by mouth.  . meclizine (ANTIVERT) 12.5 MG tablet Take 1 tablet (12.5 mg total) by mouth 3 (three) times daily as needed for dizziness.  . [DISCONTINUED] AMBULATORY NON FORMULARY MEDICATION Knee-high, medium compression, graduated compression stockings. Apply to lower extremities. [email or web address], Zippered Compression  Stockings, medium circ, long length  . [DISCONTINUED] AMBULATORY NON FORMULARY MEDICATION Rolling 4-point walker  . [DISCONTINUED] BOOSTRIX 5-2.5-18.5 LF-MCG/0.5 injection   . [DISCONTINUED] cephALEXin (KEFLEX) 500 MG capsule Take 1 capsule (500 mg total) by mouth 2 (two) times daily.   No facility-administered encounter medications on file as of 07/10/2018.        Objective:   Physical Exam  Constitutional: She is oriented to person, place, and time. She appears well-developed and well-nourished.  HENT:  Head: Normocephalic and atraumatic.  Cardiovascular: Normal rate, regular rhythm and normal heart sounds.  Pulmonary/Chest: Effort normal and breath sounds normal.  Musculoskeletal:  Nontender over the cervical spine but she does have a lot of pain over the sternocleidomastoid particularly the upper part where it attaches behind the ear.  The entire muscle feels tight and spasmed and very hard and firm.  She also has some tenderness over the right upper back, just lateral to the spine.  Nontender over the lumbar spine today or the SI joints.  Neurological: She is alert and oriented to person, place, and time.  Pill-rolling tremor in her left hand.  Skin: Skin is warm and dry.  Psychiatric: She has a normal mood and affect. Her behavior is normal.        Assessment & Plan:  Neck and back pain-recommend using a anti-inflammatory rubs such as icy hot or Biofreeze.  Recommended a trial of heat to help relax the muscle since she has a lot of spasm in the right side of her neck based on exam.  She says often she uses heat and that tends to feel better for her so I encouraged her to use whichever one helps relieve her pain.  Do not think she is a good candidate for muscle relaxer she is already feeling a little off balance.  Encouraged her to resume her physical therapy next week if she is actually feeling better.  I did encourage her to add Aleve to her Tylenol which she uses for her hip  regularly for the next 3 to 4 days.  Stop immediately if any GI upset or irritation.  Frequent yeast infections-recommend consultation with GYN.  Urinary urgency and incontinence-recommend evaluation by GYN.  She likely has prolapsed bladder based on her description of her symptoms and might benefit from a pessary.  She would likely not be a great candidate for surgery.  I still think she could also try Myrbetriq just to see if it is helpful in allowing her to get to the bathroom before she becomes incontinent.  We will see if we can find some type of free trial coupon card.  Pill-rolling tremor in her left hand-we did not get into this today but will note on her chart so that her PCP can address.

## 2018-07-10 NOTE — Patient Instructions (Signed)
Okay to start Aleve, 1 tab twice a day for the next 3 to 4 days to help the inflammation and pain.  You can take this with your Tylenol is perfectly safe to do that. Encourage you to use either heat or ice whichever one feels better. Recommend applying an icy hot salve or Biofreeze especially on the areas on your back in your neck. Try to restart your physical therapy next week if you are feeling better.

## 2018-07-15 ENCOUNTER — Other Ambulatory Visit: Payer: Self-pay | Admitting: Physician Assistant

## 2018-07-23 ENCOUNTER — Ambulatory Visit (INDEPENDENT_AMBULATORY_CARE_PROVIDER_SITE_OTHER): Payer: Medicare Other | Admitting: Family Medicine

## 2018-07-23 ENCOUNTER — Encounter: Payer: Self-pay | Admitting: Family Medicine

## 2018-07-23 VITALS — BP 161/53 | HR 53 | Ht 67.0 in | Wt 189.0 lb

## 2018-07-23 DIAGNOSIS — R233 Spontaneous ecchymoses: Secondary | ICD-10-CM

## 2018-07-23 DIAGNOSIS — M7989 Other specified soft tissue disorders: Secondary | ICD-10-CM

## 2018-07-23 DIAGNOSIS — T50905A Adverse effect of unspecified drugs, medicaments and biological substances, initial encounter: Secondary | ICD-10-CM

## 2018-07-23 DIAGNOSIS — L84 Corns and callosities: Secondary | ICD-10-CM | POA: Diagnosis not present

## 2018-07-23 DIAGNOSIS — I872 Venous insufficiency (chronic) (peripheral): Secondary | ICD-10-CM | POA: Insufficient documentation

## 2018-07-23 MED ORDER — DIPHENHYDRAMINE HCL 25 MG PO TABS: 25.0000 mg | ORAL_TABLET | Freq: Every evening | ORAL | 0 refills | Status: AC | PRN

## 2018-07-23 NOTE — Progress Notes (Signed)
Subjective:    Patient ID: Dana Calderon, female    DOB: 1941/03/28, 76 y.o.   MRN: [id number]  HPI  77 year old female is here today for bilateral lower extremity swelling.  She says it got worse over the last several days in fact yesterday she said her skin was so shiny on her right leg that she was went to the emergency department.  She even tried wearing her compression stockings a couple days ago and says it did not seem to help in fact she felt like it made it worse.  The swelling gets worse as the day goes on.  She is not currently on any diuretics.  She does take amlodipine.  Evidently this has been worked up recent previously and she does have a diagnosis of venous insufficiency.  He denies any chest pain or shortness of breath.  She says all her medications have been the same except she did start taking Bentyl once a day on August 10.  Also noted some small petechiae on her forearms and legs.  She says that has been going on for several weeks as well but was not able to pinpoint any changes.  He did see GYN for recurrent irritation infections in the vaginal area and says she is actually supposed to be scheduled for a biopsy.  Also wanted let me know that she stopped her Xanax for sleep.  In fact she says that she started noticing about half an hour after taking it she would start to itch all over.  She would like something to help her fall asleep is now that she stopped that she is having a really hard time.  She also has a thickened callus on her right great toe that is actually cracked.  She is been trying to put petroleum jelly moisturizer on it but this does not seem to be helping and even has been wearing socks at bedtime.  Review of Systems  BP (!) 161/53   Pulse (!) 53   Ht 5\' 7"  (1.702 m)   Wt 189 lb (85.7 kg)   SpO2 96%   BMI 29.60 kg/m     Allergies  Allergen Reactions  . Cefuroxime Axetil Shortness Of Breath    Has had cephalexin (Keflex) without side effects  .  Edarbi [Azilsartan] Other (See Comments)    Heart pounding, foot swelling  . Methylprednisolone Other (See Comments) and Hypertension    Raised blood pressure and sugar  . Nystop [Nystatin] Other (See Comments)    Burning skin and breathing problems  . Penicillins Other (See Comments)    Face broke out in lumps  . Red Dye Itching, Rash and Other (See Comments)    Fainting  . Tamiflu [Oseltamivir Phosphate] Diarrhea and Other (See Comments)    Fainting  . Verelan [Verapamil Hcl Er] Shortness Of Breath  . Zithromax [Azithromycin] Shortness Of Breath  . Carbidopa-Levodopa Itching and Other (See Comments)    Itchy ears, throat and tongue   . Doxazosin Mesylate Other (See Comments)    Fast pulse  . Hyoscyamine Other (See Comments)    Shakes, dizzy and headache  . Influenza Vaccines Other (See Comments)    "head in a vice", nasal itching  . Other Other (See Comments)    Cardiolyte injection - burning through body  . Xanax [Alprazolam] Itching  . Asa [Aspirin] Rash  . Atenolol Other (See Comments)    Unknown  . Avelox [Moxifloxacin] Other (See Comments)    Unknown  .  Benicar [Olmesartan] Other (See Comments)    Unknown  . Benzonatate Other (See Comments)    Unknown  . Bisoprolol Other (See Comments)    Unknown  . Cardizem [Diltiazem Hcl] Other (See Comments)    Unknown  . Carvedilol Other (See Comments)    Unknown  . Cefdinir Other (See Comments)    Unknown  . Chlorthalidone Other (See Comments)    Unknown  . Ciprofloxacin Other (See Comments)    Heartburn  . Citalopram Other (See Comments)    Unknown  . Clonidine Derivatives Other (See Comments)    Unknown  . Diovan [Valsartan] Other (See Comments)    Unknown  . Doxycycline Other (See Comments)    Headache  . Erythromycin Other (See Comments)    Unknown  . Fioricet [Butalbital-Apap-Caffeine] Other (See Comments)    Headache and dizzy  . Flexeril [Cyclobenzaprine] Other (See Comments)    Unknown  . Furosemide  Other (See Comments)    Unknown  . Hydralazine Other (See Comments)    Headache  . Hydroxyzine Other (See Comments)    nightmares  . Isosorbide Other (See Comments)    Unknown  . Lyrica [Pregabalin] Other (See Comments)    Unknown  . Macrobid [employer] Macro] Other (See Comments)    Unknown  . Mavik [Trandolapril] Other (See Comments)    Unknown  . Metronidazole Itching and Other (See Comments)    Dizzy  . Naproxen Other (See Comments)    Unknown  . Neo-Polycin Hc [Bacitra-Neomycin-Polymyxin-Hc] Itching  . Nexium [Esomeprazole Magnesium] Other (See Comments)    Headache  . Norflex [Orphenadrine Citrate] Other (See Comments)  . Oxybutynin Chloride Er Other (See Comments)    Unknown  . Paxil [Paroxetine Hcl] Other (See Comments)    Unknown  . Phenazopyridine Other (See Comments)    Unknown  . Prazosin Other (See Comments)    Unknown  . Prednisone Other (See Comments)    Unknown  . Promethazine Other (See Comments)    Unknown  . Prozac [Fluoxetine Hcl] Other (See Comments)    Unknown  . Spironolactone Other (See Comments)    Unknown  . Sulfur Other (See Comments)    Unknown  . Tizanidine Other (See Comments)    Unknown  . Valium [Diazepam] Other (See Comments)    "Too strong?"  . Zoloft [Sertraline Hcl] Other (See Comments)    Unknown    Past Medical History:  Diagnosis Date  . Anxiety   . Back pain, chronic   . Breast cancer ([place])    left  . 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 GERD (gastroesophageal reflux disease)   . Hemorrhoid   . HSV infection   . Hypertension   . 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 IBS (irritable bowel syndrome)   . Migraine   . Sterile pyuria 03/06/2018  . Vertigo     Past Surgical History:  Procedure Laterality Date  . ABDOMINAL HYSTERECTOMY    . BREAST SURGERY    . HERNIA REPAIR    . KNEE ARTHROSCOPY Left   . LUMBAR DISC SURGERY    . MASTECTOMY Left   .  TONSILLECTOMY    . TUBAL LIGATION      Social History   Socioeconomic History  . Marital status: Divorced    Spouse name: Not on file  . Number of children: 1  . Years of education: Not on file  . Highest education level: Not on file  Occupational History  . Not  on file  Social Needs  . Financial resource strain: Not on file  . Food insecurity:    Worry: Not on file    Inability: Not on file  . Transportation needs:    Medical: Not on file    Non-medical: Not on file  Tobacco Use  . Smoking status: Never Smoker  . Smokeless tobacco: Never Used  Substance and Sexual Activity  . Alcohol use: No    Alcohol/week: 0.0 standard drinks  . Drug use: No  . Sexual activity: Not Currently  Lifestyle  . Physical activity:    Days per week: Not on file    Minutes per session: Not on file  . Stress: Not on file  Relationships  . Social connections:    Talks on phone: Not on file    Gets together: Not on file    Attends religious service: Not on file    Active member of club or organization: Not on file    Attends meetings of clubs or organizations: Not on file    Relationship status: Not on file  . Intimate partner violence:    Fear of current or ex partner: Not on file    Emotionally abused: Not on file    Physically abused: Not on file    Forced sexual activity: Not on file  Other Topics Concern  . Not on file  Social History Narrative  . Not on file    Family History  Problem Relation Age of Onset  . Hypertension Mother   . Stroke Father     Outpatient Encounter Medications as of 07/23/2018  Medication Sig  . Acetaminophen (TYLENOL 8 HOUR ARTHRITIS PAIN PO) Take by mouth.  Marland Kitchen amLODipine (NORVASC) 5 MG tablet TAKE ONE TABLET BY MOUTH EVERY DAY  . clobetasol cream (TEMOVATE) 6.37 % Apply 1 application topically 2 (two) times daily.  Marland Kitchen dicyclomine (BENTYL) 10 MG capsule Take 1 capsule (10 mg total) by mouth 4 (four) times daily as needed for spasms.  .  diphenoxylate-atropine (LOMOTIL) 2.5-0.025 MG tablet TAKE ONE TABLET BY MOUTH EVERY DAY AS NEEDED FOR DIARRHEA OR LOOSE STOOLS  . lidocaine (XYLOCAINE) 5 % ointment Apply 1 application as needed topically.  Marland Kitchen lisinopril (PRINIVIL,ZESTRIL) 40 MG tablet Take 1 tablet (40 mg total) by mouth daily.  Marland Kitchen loratadine (CLARITIN) 10 MG tablet Take 1 tablet (10 mg total) by mouth daily.  . meclizine (ANTIVERT) 12.5 MG tablet Take 1 tablet (12.5 mg total) by mouth 3 (three) times daily as needed for dizziness.  . Misc Natural Products (COSAMIN ASU ADVANCED FORMULA) CAPS Take 2 tablets by mouth 2 (two) times daily.  . Multiple Minerals-Vitamins (CALCIUM-MAGNESIUM-ZINC-D3 PO) Take 1 tablet by mouth 2 (two) times daily.  . Multiple Vitamin (MULTIVITAMIN) tablet Take 1 tablet by mouth daily.  . Nebivolol HCl (BYSTOLIC) 20 MG TABS Take 2 tablets (40 mg total) by mouth daily.  Marland Kitchen nystatin-triamcinolone ointment (MYCOLOG) Apply 1 application topically 2 (two) times daily.  Marland Kitchen omeprazole (PRILOSEC) 20 MG capsule Take 1 capsule (20 mg total) by mouth daily as needed (heartburn/indigestion).  Marland Kitchen OVER THE COUNTER MEDICATION Take 1 capsule by mouth daily. "Mega Red"  . Polyethylene Glycol 400 (BLINK TEARS OP) Place 1 drop into both eyes as needed (for dry eyes).  Marland Kitchen rOPINIRole (REQUIP) 0.25 MG tablet TAKE ONE TABLET BY MOUTH THREE TIMES A DAY (Patient taking differently: TAKE 1/2 TABLET BY MOUTH SIX TIMES A DAY)  . sodium chloride (OCEAN) 0.65 % SOLN nasal spray Place  1 spray into both nostrils as needed for congestion.  . valACYclovir (VALTREX) 500 MG tablet Take 1 tablet (500 mg total) by mouth daily.  . Wheat Dextrin (BENEFIBER DRINK MIX PO) Take by mouth.  . diphenhydrAMINE (BENADRYL) 25 MG tablet Take 1 tablet (25 mg total) by mouth at bedtime as needed for sleep.   No facility-administered encounter medications on file as of 07/23/2018.          Objective:   Physical Exam  Constitutional: She is oriented to  person, place, and time. She appears well-developed and well-nourished.  HENT:  Head: Normocephalic and atraumatic.  Cardiovascular: Normal rate, regular rhythm and normal heart sounds.  Pulmonary/Chest: Effort normal and breath sounds normal.  Neurological: She is alert and oriented to person, place, and time.  Skin: Skin is warm and dry.  2+ pitting edema of both legs starting from the knees downward which it is more significant on the right compared to the left. Has some scattered petechiae on her forearms and both inner thighs.  Psychiatric: She has a normal mood and affect. Her behavior is normal.        Assessment & Plan:  Lower extremity edema-evidently this has been worked up previously and she is been diagnosed with venous stasis.  Some not sure if something is changed it is has worsened her symptoms.  We will do some additional work-up today and will check her thyroid, we will check a BNP even though she is not having any chest pain or shortness of breath today.  We could consider a diuretic though she has multiple allergies and I do not know if she would tolerate anything new.  We may just focus on elevating the legs.  Is on her intolerance list we could try she is already failed spironolactone, hydrochlorothiazide, furosemide, and chlorthalidone.  Medication side effect-discontinue Xanax.  We can try a low-dose of diphenhydramine.  Callus of right great toe-recommend soaking for 5 to 10 minutes and then using a pumice stone to remove some of the dead skin and then continue to apply the petroleum jelly nightly.  Insomnia-we will try did diphenhydramine.  I do not see this on her intolerance list.

## 2018-07-24 LAB — URINALYSIS, ROUTINE W REFLEX MICROSCOPIC
BILIRUBIN URINE: NEGATIVE
Glucose, UA: NEGATIVE
HGB URINE DIPSTICK: NEGATIVE
KETONES UR: NEGATIVE
NITRITE: NEGATIVE
PH: 5.5 (ref 5.0–8.0)
Protein, ur: NEGATIVE
SPECIFIC GRAVITY, URINE: 1.018 (ref 1.001–1.03)

## 2018-07-24 LAB — CBC WITH DIFFERENTIAL/PLATELET
Basophils Absolute: 57 cells/uL (ref 0–200)
Basophils Relative: 0.5 %
EOS PCT: 0.8 %
Eosinophils Absolute: 90 cells/uL (ref 15–500)
HCT: 40.8 % (ref 35.0–45.0)
HEMOGLOBIN: 13.7 g/dL (ref 11.7–15.5)
Lymphs Abs: 2870 cells/uL (ref 850–3900)
MCH: 30.5 pg (ref 27.0–33.0)
MCHC: 33.6 g/dL (ref 32.0–36.0)
MCV: 90.9 fL (ref 80.0–100.0)
MONOS PCT: 5.4 %
MPV: 10.8 fL (ref 7.5–12.5)
NEUTROS ABS: 7673 {cells}/uL (ref 1500–7800)
NEUTROS PCT: 67.9 %
Platelets: 260 10*3/uL (ref 140–400)
RBC: 4.49 10*6/uL (ref 3.80–5.10)
RDW: 13.6 % (ref 11.0–15.0)
Total Lymphocyte: 25.4 %
WBC mixed population: 610 cells/uL (ref 200–950)
WBC: 11.3 10*3/uL — AB (ref 3.8–10.8)

## 2018-07-24 LAB — COMPLETE METABOLIC PANEL WITH GFR
AG RATIO: 1.6 (calc) (ref 1.0–2.5)
ALKALINE PHOSPHATASE (APISO): 78 U/L (ref 33–130)
ALT: 15 U/L (ref 6–29)
AST: 20 U/L (ref 10–35)
Albumin: 4.2 g/dL (ref 3.6–5.1)
BUN: 18 mg/dL (ref 7–25)
CHLORIDE: 100 mmol/L (ref 98–110)
CO2: 28 mmol/L (ref 20–32)
Calcium: 9.8 mg/dL (ref 8.6–10.4)
Creat: 0.86 mg/dL (ref 0.60–0.93)
GFR, Est African American: 76 mL/min/{1.73_m2} (ref >=60)
GFR, Est Non African American: 66 mL/min/{1.73_m2} (ref >=60)
GLUCOSE: 108 mg/dL (ref 65–139)
Globulin: 2.7 g/dL (calc) (ref 1.9–3.7)
POTASSIUM: 4.4 mmol/L (ref 3.5–5.3)
Sodium: 138 mmol/L (ref 135–146)
TOTAL PROTEIN: 6.9 g/dL (ref 6.1–8.1)
Total Bilirubin: 0.5 mg/dL (ref 0.2–1.2)

## 2018-07-24 LAB — PROTIME-INR
INR: 0.9
Prothrombin Time: 10 s (ref 9.0–11.5)

## 2018-07-24 LAB — BRAIN NATRIURETIC PEPTIDE: Brain Natriuretic Peptide: 160 pg/mL — ABNORMAL HIGH (ref <100)

## 2018-07-24 LAB — TSH: TSH: 2.24 mIU/L (ref 0.40–4.50)

## 2018-07-25 ENCOUNTER — Encounter: Payer: Self-pay | Admitting: Physician Assistant

## 2018-07-25 DIAGNOSIS — Z8673 Personal history of transient ischemic attack (TIA), and cerebral infarction without residual deficits: Secondary | ICD-10-CM

## 2018-07-25 HISTORY — DX: Personal history of transient ischemic attack (TIA), and cerebral infarction without residual deficits: Z86.73

## 2018-07-29 ENCOUNTER — Ambulatory Visit: Payer: Medicare Other | Admitting: Neurology

## 2018-07-29 ENCOUNTER — Encounter: Payer: Self-pay | Admitting: Neurology

## 2018-07-29 VITALS — BP 161/82 | HR 66 | Ht 66.5 in | Wt 190.0 lb

## 2018-07-29 DIAGNOSIS — G20 Parkinson's disease: Secondary | ICD-10-CM

## 2018-07-29 NOTE — Patient Instructions (Signed)
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 As you know, this disease does progress with time. It can affect your balance, your memory, your mood, your bowel and bladder function, your posture, balance and walking and your activities of daily living. However, there are good supportive treatments and symptomatic treatments available, so most patients have a change to a good quality life and life expectancy is not typically altered. Overall you are doing fairly well but I do want to suggest a few things today:  Remember to drink plenty of fluid at least 6 glasses (8 oz each), eat healthy meals and do not skip any meals. Try to eat protein with a every meal and eat a healthy snack such as fruit or nuts in between meals. Try to keep a regular sleep-wake schedule and try to exercise daily, particularly in the form of walking, 20-30 minutes a day, if you can.   Unfortunately, you have had a reaction to Sinemet. You are on Requip, but it may be causing, at least in part, your significant leg swelling.   Try to stay active physically and mentally. Engage in social activities in your community and with your family and try to keep up with current events by reading the newspaper or watching the news. Try to do word puzzles and you may like to do puzzles and brain games on the computer such as on [email or web address]   Please continue with therapy at home.   As far as your medications are concerned, I would not be able to suggest anything at this point. I would like for you to talk to [facility] about tapering off the Requip.   Please discuss with her the possibility of going to an academic center.   As far as diagnostic testing, I will order: we can consider the DaT scan: This is a specialized brain scan designed to help with diagnosis of tremor disorders. A radioactive marker gets injected and the uptake is measured in the brain and compared to normal controls and right side is compared to the left, a  change in uptake can help with diagnosis of certain tremor disorders. A brain MRI on the other hand is a brain scan that helps look at the brain structure in more detail overall and look for age-related changes, blood vessel related changes and look for stroke and volume loss which we call atrophy.   You can think about it.   I will see you back as needed at this point.

## 2018-07-29 NOTE — Progress Notes (Signed)
Subjective:    Patient ID: Dana Calderon is a 77 y.o. female.  HPI     Star Age, MD, PhD [facility] [address] [place], [place] [address]  Dear Dana Calderon,   I saw your patient, Dana Calderon, upon your kind request in my neurologic clinic today for initial consultation of her parkinsonism. The patient is accompanied by her son today. As you know, Dana Calderon is a 77 year old right-handed woman with an underlying medical history of breast cancer, anxiety, history of prior lacunar stroke, hypertension, irritable bowel syndrome, hypertriglyceridemia, reflux disease and obesity, who reports a nearly 4 year history of left hand tremor. It has been progressive. She has noticed a recent tremor in her foot on the left side. She has had fine motor dyscontrol. She has had gait difficulties and has fallen. She saw Dr. Lynnette Caffey , neurologist in [place] last year and has seen him intermittently for the past few years. She has been treated for tremor with primidone. She was tried on Sinemet but had some reaction to it including itching in her throat. She is no longer on either of these medications. She is on ropinirole low dose 0.25 mg strength half a pill 6 times a day. She is taking a low dose because she feels that she cannot tolerate the higher dose. Of note, she has developed lower extremity swelling which has become worse in the past couple of months. She had recent blood work last week which I reviewed. She is retired, has 1 son, lives alone. She is divorced. She is a nonsmoker and does not utilize alcohol, drinks caffeine in the form of coffee, 2 cups per day on average. She has multiple drug allergies and intolerances. She uses a rolling walker. She has physical therapy and occupational therapy at home.  Her Past Medical History Is Significant For: Past Medical History:  Diagnosis Date  . Anxiety   . Back pain, chronic   . Breast cancer ([place])    left   . 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 GERD (gastroesophageal reflux disease)   . Hemorrhoid   . History of lacunar cerebrovascular accident (CVA) 07/25/2018   CT Head 07/24/18 Age-indeterminate lacunar infarct suggested region of the anterior internal capsule on the right, new from the previous study. Old lacunar infarct posterior subinsular region on the left.  Marland Kitchen HSV infection   . Hypertension   . 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 IBS (irritable bowel syndrome)   . Migraine   . Sterile pyuria 03/06/2018  . Vertigo     Her Past Surgical History Is Significant For: Past Surgical History:  Procedure Laterality Date  . ABDOMINAL HYSTERECTOMY    . BREAST SURGERY    . HERNIA REPAIR    . KNEE ARTHROSCOPY Left   . LUMBAR DISC SURGERY    . MASTECTOMY Left   . TONSILLECTOMY    . TUBAL LIGATION      Her Family History Is Significant For: Family History  Problem Relation Age of Onset  . Hypertension Mother   . Stroke Father     Her Social History Is Significant For: Social History   Socioeconomic History  . Marital status: Divorced    Spouse name: Not on file  . Number of children: 1  . Years of education: Not on file  . Highest education level: Not on file  Occupational History  . Not on file  Social Needs  . Financial resource strain: Not  on file  . Food insecurity:    Worry: Not on file    Inability: Not on file  . Transportation needs:    Medical: Not on file    Non-medical: Not on file  Tobacco Use  . Smoking status: Never Smoker  . Smokeless tobacco: Never Used  Substance and Sexual Activity  . Alcohol use: No    Alcohol/week: 0.0 standard drinks  . Drug use: No  . Sexual activity: Not Currently  Lifestyle  . Physical activity:    Days per week: Not on file    Minutes per session: Not on file  . Stress: Not on file  Relationships  . Social connections:    Talks on phone: Not on file    Gets together: Not on  file    Attends religious service: Not on file    Active member of club or organization: Not on file    Attends meetings of clubs or organizations: Not on file    Relationship status: Not on file  Other Topics Concern  . Not on file  Social History Narrative  . Not on file    Her Allergies Are:  Allergies  Allergen Reactions  . Cefuroxime Axetil Shortness Of Breath    Has had cephalexin (Keflex) without side effects  . Edarbi [Azilsartan] Other (See Comments)    Heart pounding, foot swelling  . Methylprednisolone Other (See Comments) and Hypertension    Raised blood pressure and sugar  . Nystop [Nystatin] Other (See Comments)    Burning skin and breathing problems  . Penicillins Other (See Comments)    Face broke out in lumps  . Red Dye Itching, Rash and Other (See Comments)    Fainting  . Tamiflu [Oseltamivir Phosphate] Diarrhea and Other (See Comments)    Fainting  . Verelan [Verapamil Hcl Er] Shortness Of Breath  . Zithromax [Azithromycin] Shortness Of Breath  . Carbidopa-Levodopa Itching and Other (See Comments)    Itchy ears, throat and tongue   . Doxazosin Mesylate Other (See Comments)    Fast pulse  . Hyoscyamine Other (See Comments)    Shakes, dizzy and headache  . Influenza Vaccines Other (See Comments)    "head in a vice", nasal itching  . Other Other (See Comments)    Cardiolyte injection - burning through body  . Xanax [Alprazolam] Itching  . Asa [Aspirin] Rash  . Atenolol Other (See Comments)    Unknown  . Avelox [Moxifloxacin] Other (See Comments)    Unknown  . Benicar [Olmesartan] Other (See Comments)    Unknown  . Benzonatate Other (See Comments)    Unknown  . Bisoprolol Other (See Comments)    Unknown  . Cardizem [Diltiazem Hcl] Other (See Comments)    Unknown  . Carvedilol Other (See Comments)    Unknown  . Cefdinir Other (See Comments)    Unknown  . Chlorthalidone Other (See Comments)    Unknown  . Ciprofloxacin Other (See Comments)     Heartburn  . Citalopram Other (See Comments)    Unknown  . Clonidine Derivatives Other (See Comments)    Unknown  . Diovan [Valsartan] Other (See Comments)    Unknown  . Doxycycline Other (See Comments)    Headache  . Erythromycin Other (See Comments)    Unknown  . Fioricet [Butalbital-Apap-Caffeine] Other (See Comments)    Headache and dizzy  . Flexeril [Cyclobenzaprine] Other (See Comments)    Unknown  . Furosemide Other (See Comments)    Unknown  .  Hydralazine Other (See Comments)    Headache  . Hydroxyzine Other (See Comments)    nightmares  . Isosorbide Other (See Comments)    Unknown  . Lyrica [Pregabalin] Other (See Comments)    Unknown  . Macrobid [employer] Macro] Other (See Comments)    Unknown  . Mavik [Trandolapril] Other (See Comments)    Unknown  . Metronidazole Itching and Other (See Comments)    Dizzy  . Naproxen Other (See Comments)    Unknown  . Neo-Polycin Hc [Bacitra-Neomycin-Polymyxin-Hc] Itching  . Nexium [Esomeprazole Magnesium] Other (See Comments)    Headache  . Norflex [Orphenadrine Citrate] Other (See Comments)  . Oxybutynin Chloride Er Other (See Comments)    Unknown  . Paxil [Paroxetine Hcl] Other (See Comments)    Unknown  . Phenazopyridine Other (See Comments)    Unknown  . Prazosin Other (See Comments)    Unknown  . Prednisone Other (See Comments)    Unknown  . Promethazine Other (See Comments)    Unknown  . Prozac [Fluoxetine Hcl] Other (See Comments)    Unknown  . Spironolactone Other (See Comments)    Unknown  . Sulfur Other (See Comments)    Unknown  . Tizanidine Other (See Comments)    Unknown  . Valium [Diazepam] Other (See Comments)    "Too strong?"  . Zoloft [Sertraline Hcl] Other (See Comments)    Unknown  :   Her Current Medications Are:  Outpatient Encounter Medications as of 07/29/2018  Medication Sig  . Acetaminophen (TYLENOL 8 HOUR ARTHRITIS PAIN PO) Take by mouth.  Marland Kitchen amLODipine (NORVASC) 5 MG  tablet TAKE ONE TABLET BY MOUTH EVERY DAY  . clobetasol cream (TEMOVATE) 1.61 % Apply 1 application topically 2 (two) times daily.  Marland Kitchen dicyclomine (BENTYL) 10 MG capsule Take 1 capsule (10 mg total) by mouth 4 (four) times daily as needed for spasms.  . diphenhydrAMINE (BENADRYL) 25 MG tablet Take 1 tablet (25 mg total) by mouth at bedtime as needed for sleep.  . diphenoxylate-atropine (LOMOTIL) 2.5-0.025 MG tablet TAKE ONE TABLET BY MOUTH EVERY DAY AS NEEDED FOR DIARRHEA OR LOOSE STOOLS  . lidocaine (XYLOCAINE) 5 % ointment Apply 1 application as needed topically.  Marland Kitchen lisinopril (PRINIVIL,ZESTRIL) 40 MG tablet Take 1 tablet (40 mg total) by mouth daily.  Marland Kitchen loratadine (CLARITIN) 10 MG tablet Take 1 tablet (10 mg total) by mouth daily.  . meclizine (ANTIVERT) 12.5 MG tablet Take 1 tablet (12.5 mg total) by mouth 3 (three) times daily as needed for dizziness.  . Misc Natural Products (COSAMIN ASU ADVANCED FORMULA) CAPS Take 2 tablets by mouth 2 (two) times daily.  . Multiple Minerals-Vitamins (CALCIUM-MAGNESIUM-ZINC-D3 PO) Take 1 tablet by mouth 2 (two) times daily.  . Multiple Vitamin (MULTIVITAMIN) tablet Take 1 tablet by mouth daily.  . Nebivolol HCl (BYSTOLIC) 20 MG TABS Take 2 tablets (40 mg total) by mouth daily.  Marland Kitchen nystatin-triamcinolone ointment (MYCOLOG) Apply 1 application topically 2 (two) times daily.  Marland Kitchen omeprazole (PRILOSEC) 20 MG capsule Take 1 capsule (20 mg total) by mouth daily as needed (heartburn/indigestion).  Marland Kitchen OVER THE COUNTER MEDICATION Take 1 capsule by mouth daily. "Mega Red"  . Polyethylene Glycol 400 (BLINK TEARS OP) Place 1 drop into both eyes as needed (for dry eyes).  Marland Kitchen rOPINIRole (REQUIP) 0.25 MG tablet TAKE ONE TABLET BY MOUTH THREE TIMES A DAY (Patient taking differently: TAKE 1/2 TABLET BY MOUTH SIX TIMES A DAY)  . sodium chloride (OCEAN) 0.65 % SOLN nasal spray  Place 1 spray into both nostrils as needed for congestion.  . valACYclovir (VALTREX) 500 MG tablet Take 1  tablet (500 mg total) by mouth daily.  . Wheat Dextrin (BENEFIBER DRINK MIX PO) Take by mouth.   No facility-administered encounter medications on file as of 07/29/2018.   : Review of Systems:  Out of a complete 14 point review of systems, all are reviewed and negative with the exception of these symptoms as listed below:  Review of Systems  Neurological:       Pt presents today to discuss her tremors. Pt was seen by Dr. Lynnette Caffey, neurologist, and was told that she did not have PD. She was seen then by her PCP that reported to her that she did have PD. Pt is asking for a u-step walker. Pt needs a letter, not medical records, stating that she has PD. Pt is complaining of tremors and of tiny hand writing. Pt had a recent fall for which she went to the ER.    Objective:  Neurological Exam  Physical Exam Physical Examination:   Vitals:   07/29/18 1430  BP: (!) 161/82  Pulse: 66   General Examination: The patient is a very pleasant 77 y.o. female in no acute distress. She appears well-developed and well-nourished and well groomed.   HEENT: Normocephalic, atraumatic, pupils are equal, round and reactive to light and accommodation. She has corrective eyeglasses in place, she has mild facial masking, mild decrease in eye blink rate. Extraocular tracking is mildly impaired. Hearing is grossly intact. She has no lip, neck or jaw tremor. She has mild to moderate nuchal rigidity. Airway examination shows no focal findings, no sialorrhea as noted, no oral facial dyskinesias. Speech is mildly hypophonic, no dysarthria noted.  Chest: Clear to auscultation without wheezing, rhonchi or crackles noted.  Heart: S1+S2+0, regular and normal without murmurs, rubs or gallops noted.   Abdomen: Soft, non-tender and non-distended with normal bowel sounds appreciated on auscultation.  Extremities: There is 2+ edema in the distal lower extremities bilaterally, From below-the-knee downwards, right more than  left.includes both feet.  Skin: Warm and dry without trophic changes noted. There are no varicose veins.  Musculoskeletal: exam reveals no obvious joint deformities, tenderness or joint swelling or erythema.   Neurologically:  Mental status: The patient is awake, alert and oriented in all 4 spheres. Her immediate and remote memory, attention, language skills and fund of knowledge are appropriate. There is no evidence of aphasia, agnosia, apraxia or anomia. Speech is clear with normal prosody and enunciation. Thought process is linear. Mood is normal and affect is normal.  Cranial nerves II - XII are as described above under HEENT exam. In addition: left shoulder is higher than right. Motor exam: Normal bulk, global strength of 4-5 out of 5. She has increase in tone in the left upper extremity. She has a resting tremor in the left upper extremity only. On fine motor testing she has difficulty with finger taps, hand movements, rapid alternating patting, foot taps and foot agility in the mild range on the right and moderate range on the left. She stands with difficulty, she stands wider base. She has to push her self up. Posture shows mildly stooped posture with slightly into the right. Left shoulder is elevated compared to the right. She walks with decreased stride length and decrease pace, difficulty with turns, requires multiple steps to turn and balance is impaired. She has one slight episode of freezing. Reflexes are 1+ in the UEs, absent  in the LEs.   Cerebellar testing: No dysmetria or intention tremor. There is no truncal or gait ataxia.  Sensory exam: intact to light touch.    Assessment and plan:   In summary, Dana Calderon is a very pleasant 77 y.o.-year old female with an underlying medical history of breast cancer, anxiety, history of prior lacunar stroke, hypertension, irritable bowel syndrome, hypertriglyceridemia, reflux disease and obesity, who presents for evaluation of her tremor  disorder and concerning for parkinsonism. 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 She has had symptoms since 2015 or so. She was tried on Sinemet and unfortunately had a reaction to it, does not recall if it benefited her at the time. She has numerous allergies. She was tried on primidone in the past which also caused some side effect as I understand. She is on low-dose ropinirole but has not noticed any improvement. I'm worried about her lower extremity swelling which could be in part from taking amlodipine and also in part from taking ropinirole. She is encouraged to talk to you about tapering off of this medication. Unfortunately I do not have any medication suggestions at this time. I suggested we proceed with a DaT scan for further help diagnostically. Ultimately, as far as treatment option I do not have any suggestions at this time. She is encouraged to talk to about potentially being referred to an academic center for further advice and possible medication trials and also possible research trial participation. She would not be interested in pursuing DBS.  At this juncture, she will follow-up with you and discuss next steps with you. Of note, she requested a prescription for the U Step walker. I explained to her that she may not be an appropriate candidate for this walker as she has significant leg swelling and has fallen. She may be at higher risk from injury from using the U step walker. She is encouraged to use her rolling walker and continue with therapy at home. I will see her back as needed. I answered all their questions today and the patient and her son were in agreement. Thank you very much for allowing me to participate in the care of this nice patient. If I can be of any further assistance to you please do not hesitate to call me at [phone].  Sincerely,   Star Age, MD, PhD

## 2018-07-30 ENCOUNTER — Other Ambulatory Visit: Payer: Self-pay | Admitting: Cardiology

## 2018-07-30 NOTE — Telephone Encounter (Signed)
Rx sent to pharmacy   

## 2018-08-04 ENCOUNTER — Other Ambulatory Visit: Payer: Self-pay | Admitting: Physician Assistant

## 2018-08-04 ENCOUNTER — Ambulatory Visit (INDEPENDENT_AMBULATORY_CARE_PROVIDER_SITE_OTHER): Payer: Medicare Other | Admitting: Physician Assistant

## 2018-08-04 ENCOUNTER — Encounter: Payer: Self-pay | Admitting: Physician Assistant

## 2018-08-04 VITALS — BP 133/78 | HR 65 | Wt 190.0 lb

## 2018-08-04 DIAGNOSIS — G20 Parkinson's disease: Secondary | ICD-10-CM | POA: Diagnosis not present

## 2018-08-04 DIAGNOSIS — K580 Irritable bowel syndrome with diarrhea: Secondary | ICD-10-CM

## 2018-08-04 DIAGNOSIS — I10 Essential (primary) hypertension: Secondary | ICD-10-CM | POA: Diagnosis not present

## 2018-08-04 DIAGNOSIS — I872 Venous insufficiency (chronic) (peripheral): Secondary | ICD-10-CM

## 2018-08-04 DIAGNOSIS — Z9181 History of falling: Secondary | ICD-10-CM

## 2018-08-04 MED ORDER — FUROSEMIDE 20 MG PO TABS: 20.0000 mg | ORAL_TABLET | Freq: Every day | ORAL | 3 refills | Status: AC

## 2018-08-04 MED ORDER — AMLODIPINE BESYLATE 5 MG PO TABS: 2.5000 mg | ORAL_TABLET | Freq: Every day | ORAL | 0 refills | Status: AC

## 2018-08-04 MED ORDER — POTASSIUM CHLORIDE CRYS ER 10 MEQ PO TBCR: 20.0000 meq | EXTENDED_RELEASE_TABLET | Freq: Every day | ORAL | 3 refills | Status: AC

## 2018-08-04 NOTE — Progress Notes (Signed)
HPI:                                                                Dana Calderon is a 77 y.o. female who presents to [facility] today for hospital discharge follow-up  Dana Calderon is not doing well. She has had multiple falls in the last month and 3 visits to the ED.  Attributes falls to her leg swelling, right worse than left. She is unable to wear compression stockings due to the severity of the swelling. Swelling is severe and painful. She denies chest pain, claudication, dyspnea, orthopnea, PND.  Requesting letter for Parkinson walker. Recently evaluated by neurology (Dr. 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 They also recommended she discuss tapering off of her Requip, since it was offering no benefit to her Parksinsons and may be contributing to her swelling. Dr. Rexene Alberts was unable to offer any additional medication recommendations due to Dana Calderon's multiple intolerances. She did suggest the possibility of treatment at an academic center and possible DaT scan. Dr. Rexene Alberts recommended against the Parkinson walker because she felt it was bulky and would increase her fall risk, but patient wishes to try it.   Past Medical History:  Diagnosis Date  . Anxiety   . Back pain, chronic   . Breast cancer ([place])    left  . 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 GERD (gastroesophageal reflux disease)   . Hemorrhoid   . History of lacunar cerebrovascular accident (CVA) 07/25/2018   CT Head 07/24/18 Age-indeterminate lacunar infarct suggested region of the anterior internal capsule on the right, new from the previous study. Old lacunar infarct posterior subinsular region on the left.  Marland Kitchen HSV infection   . Hypertension   . 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 IBS (irritable bowel syndrome)   . Migraine   . Sterile pyuria 03/06/2018  . Vertigo    Past Surgical History:  Procedure Laterality Date  .  ABDOMINAL HYSTERECTOMY    . BREAST SURGERY    . HERNIA REPAIR    . KNEE ARTHROSCOPY Left   . LUMBAR DISC SURGERY    . MASTECTOMY Left   . TONSILLECTOMY    . TUBAL LIGATION     Social History   Tobacco Use  . Smoking status: Never Smoker  . Smokeless tobacco: Never Used  Substance Use Topics  . Alcohol use: No    Alcohol/week: 0.0 standard drinks   family history includes Hypertension in her mother; Stroke in her father.    ROS: negative except as noted in the HPI  Medications: Current Outpatient Medications  Medication Sig Dispense Refill  . Acetaminophen (TYLENOL 8 HOUR ARTHRITIS PAIN PO) Take by mouth.    Marland Kitchen amLODipine (NORVASC) 5 MG tablet Take 0.5 tablets (2.5 mg total) by mouth daily. 90 tablet 0  . clobetasol cream (TEMOVATE) 4.40 % Apply 1 application topically 2 (two) times daily. 15 g 0  . dicyclomine (BENTYL) 10 MG capsule Take 1 capsule (10 mg total) by mouth 4 (four) times daily as needed for spasms. 120 capsule 5  . diphenhydrAMINE (BENADRYL) 25 MG tablet Take 1 tablet (25 mg total) by mouth at bedtime as needed for sleep. 30 tablet 0  . diphenoxylate-atropine (  LOMOTIL) 2.5-0.025 MG tablet TAKE ONE TABLET BY MOUTH EVERY DAY AS NEEDED FOR DIARRHEA OR LOOSE STOOLS 90 tablet 0  . furosemide (LASIX) 20 MG tablet Take 1 tablet (20 mg total) by mouth daily. 30 tablet 3  . lidocaine (XYLOCAINE) 5 % ointment Apply 1 application as needed topically. 35.44 g 0  . lisinopril (PRINIVIL,ZESTRIL) 40 MG tablet Take 1 tablet (40 mg total) by mouth daily. 90 tablet 1  . loratadine (CLARITIN) 10 MG tablet Take 1 tablet (10 mg total) by mouth daily. 10 tablet 0  . meclizine (ANTIVERT) 12.5 MG tablet Take 1 tablet (12.5 mg total) by mouth 3 (three) times daily as needed for dizziness. 30 tablet 0  . Misc Natural Products (COSAMIN ASU ADVANCED FORMULA) CAPS Take 2 tablets by mouth 2 (two) times daily. 120 capsule 11  . Multiple Minerals-Vitamins (CALCIUM-MAGNESIUM-ZINC-D3 PO) Take 1  tablet by mouth 2 (two) times daily.    . Multiple Vitamin (MULTIVITAMIN) tablet Take 1 tablet by mouth daily.    . Nebivolol HCl (BYSTOLIC) 20 MG TABS Take 2 tablets (40 mg total) by mouth daily. 60 tablet 5  . NONFORMULARY OR COMPOUNDED ITEM ReadyWrap knee high medium compression Apply to both lower extremities daily 1 each 0  . nystatin-triamcinolone ointment (MYCOLOG) Apply 1 application topically 2 (two) times daily. 30 g 0  . omeprazole (PRILOSEC) 20 MG capsule Take 1 capsule (20 mg total) by mouth daily as needed (heartburn/indigestion). 90 capsule 0  . OVER THE COUNTER MEDICATION Take 1 capsule by mouth daily. "Mega Red"    . Polyethylene Glycol 400 (BLINK TEARS OP) Place 1 drop into both eyes as needed (for dry eyes).    . potassium chloride (K-DUR,KLOR-CON) 10 MEQ tablet Take 2 tablets (20 mEq total) by mouth daily. Take with Furosemide 30 tablet 3  . sodium chloride (OCEAN) 0.65 % SOLN nasal spray Place 1 spray into both nostrils as needed for congestion. 15 mL 0  . valACYclovir (VALTREX) 500 MG tablet Take 1 tablet (500 mg total) by mouth daily. 30 tablet 11  . Wheat Dextrin (BENEFIBER DRINK MIX PO) Take by mouth.     No current facility-administered medications for this visit.    Allergies  Allergen Reactions  . Cefuroxime Axetil Shortness Of Breath    Has had cephalexin (Keflex) without side effects  . Edarbi [Azilsartan] Other (See Comments)    Heart pounding, foot swelling  . Methylprednisolone Other (See Comments) and Hypertension    Raised blood pressure and sugar  . Nystop [Nystatin] Other (See Comments)    Burning skin and breathing problems  . Penicillins Other (See Comments)    Face broke out in lumps  . Red Dye Itching, Rash and Other (See Comments)    Fainting  . Tamiflu [Oseltamivir Phosphate] Diarrhea and Other (See Comments)    Fainting  . Verelan [Verapamil Hcl Er] Shortness Of Breath  . Zithromax [Azithromycin] Shortness Of Breath  . Carbidopa-Levodopa  Itching and Other (See Comments)    Itchy ears, throat and tongue   . Doxazosin Mesylate Other (See Comments)    Fast pulse  . Hyoscyamine Other (See Comments)    Shakes, dizzy and headache  . Influenza Vaccines Other (See Comments)    "head in a vice", nasal itching  . Other Other (See Comments)    Cardiolyte injection - burning through body  . Xanax [Alprazolam] Itching  . Asa [Aspirin] Rash  . Atenolol Other (See Comments)    Unknown  . Avelox [  Moxifloxacin] Other (See Comments)    Unknown  . Benicar [Olmesartan] Other (See Comments)    Unknown  . Benzonatate Other (See Comments)    Unknown  . Bisoprolol Other (See Comments)    Unknown  . Cardizem [Diltiazem Hcl] Other (See Comments)    Unknown  . Carvedilol Other (See Comments)    Unknown  . Cefdinir Other (See Comments)    Unknown  . Chlorthalidone Other (See Comments)    Unknown  . Ciprofloxacin Other (See Comments)    Heartburn  . Citalopram Other (See Comments)    Unknown  . Clonidine Derivatives Other (See Comments)    Unknown  . Diovan [Valsartan] Other (See Comments)    Unknown  . Doxycycline Other (See Comments)    Headache  . Erythromycin Other (See Comments)    Unknown  . Fioricet [Butalbital-Apap-Caffeine] Other (See Comments)    Headache and dizzy  . Flexeril [Cyclobenzaprine] Other (See Comments)    Unknown  . Furosemide Other (See Comments)    Unknown  . Hydralazine Other (See Comments)    Headache  . Hydroxyzine Other (See Comments)    nightmares  . Isosorbide Other (See Comments)    Unknown  . Lyrica [Pregabalin] Other (See Comments)    Unknown  . Macrobid [employer] Macro] Other (See Comments)    Unknown  . Mavik [Trandolapril] Other (See Comments)    Unknown  . Metronidazole Itching and Other (See Comments)    Dizzy  . Naproxen Other (See Comments)    Unknown  . Neo-Polycin Hc [Bacitra-Neomycin-Polymyxin-Hc] Itching  . Nexium [Esomeprazole Magnesium] Other (See  Comments)    Headache  . Norflex [Orphenadrine Citrate] Other (See Comments)  . Oxybutynin Chloride Er Other (See Comments)    Unknown  . Paxil [Paroxetine Hcl] Other (See Comments)    Unknown  . Phenazopyridine Other (See Comments)    Unknown  . Prazosin Other (See Comments)    Unknown  . Prednisone Other (See Comments)    Unknown  . Promethazine Other (See Comments)    Unknown  . Prozac [Fluoxetine Hcl] Other (See Comments)    Unknown  . Spironolactone Other (See Comments)    Unknown  . Sulfur Other (See Comments)    Unknown  . Tizanidine Other (See Comments)    Unknown  . Valium [Diazepam] Other (See Comments)    "Too strong?"  . Zoloft [Sertraline Hcl] Other (See Comments)    Unknown       Objective:  BP 133/78   Pulse 65   Wt 190 lb (86.2 kg)   BMI 30.21 kg/m  Gen:  alert, not ill-appearing, no distress, appropriate for age 25: head normocephalic without obvious abnormality, conjunctiva and cornea clear, trachea midline Pulm: Normal work of breathing, normal phonation, clear to auscultation bilaterally, no wheezes, rales or rhonchi CV: Normal rate, regular rhythm, s1 and s2 distinct, no murmurs, clicks or rubs  Neuro: alert and oriented x 3, left-sided tremor MSK: extremities atraumatic, slowed gait, ambulating with a walker, 4+ peripheral edema to the knee bilaterally Skin: intact, no rashes on exposed skin, no jaundice, no cyanosis    No results found for this or any previous visit (from the past 72 hour(s)). No results found.    Assessment and Plan: 77 y.o. female with   .Lenora was seen today for hospitalization follow-up.  Diagnoses and all orders for this visit:  Edema of both lower extremities due to peripheral venous insufficiency -     furosemide (  LASIX) 20 MG tablet; Take 1 tablet (20 mg total) by mouth daily.  Parkinson's disease ([place])  Hypertension goal BP (blood pressure) < 140/90 -     amLODipine (NORVASC) 5 MG tablet; Take 0.5  tablets (2.5 mg total) by mouth daily.  Other orders -     potassium chloride (K-DUR,KLOR-CON) 10 MEQ tablet; Take 2 tablets (20 mEq total) by mouth daily. Take with Furosemide   - long discussion with Saralee that her best and only treatment option for the severity of her edema is to start a loop diuretic. Furosemide is listed on her allergy list, however, this is more of an intolerance and she does not recall what her adverse effect was. We will start with a low-dose due to her history of intolerance. Starting 20 mg bid. Instructed to take potassium supplement when she takes her Lasix - her BP is great today. I am going to reduce her Amlodipine to 5 mg daily to hopefully improve the edema.  Her BP has been very difficult to control, so I would hate to discontinue this altogether - Shareena is currently micro-dosing her Requip, taking halfs and quarter tablets several times per day. Discussed that I would be okay with her tapering off of this medication. I will also complete the forms for her Parkison walker at her request. We will reach out to her [facility] Case Worker Noelle Penner [phone])    Patient education and anticipatory guidance given Patient agrees with treatment plan Follow-up in 2 weeks or sooner as needed if symptoms worsen or fail to improve  Darlyne Russian PA-C

## 2018-08-05 ENCOUNTER — Ambulatory Visit: Payer: Medicare Other | Admitting: Physician Assistant

## 2018-08-07 ENCOUNTER — Encounter: Payer: Self-pay | Admitting: Physician Assistant

## 2018-08-07 ENCOUNTER — Ambulatory Visit (INDEPENDENT_AMBULATORY_CARE_PROVIDER_SITE_OTHER): Payer: Medicare Other | Admitting: Physician Assistant

## 2018-08-07 VITALS — BP 110/69 | HR 71 | Wt 189.0 lb

## 2018-08-07 DIAGNOSIS — I872 Venous insufficiency (chronic) (peripheral): Secondary | ICD-10-CM | POA: Diagnosis not present

## 2018-08-07 DIAGNOSIS — Z9181 History of falling: Secondary | ICD-10-CM

## 2018-08-07 MED ORDER — NONFORMULARY OR COMPOUNDED ITEM: 0 refills | Status: AC

## 2018-08-07 NOTE — Patient Instructions (Addendum)
- Increase your morning Furosemide to a full tablet. Continue your evening Furosemide of 1/2 tablet - You only need to take 1 potassium twice a day while you are taking the Furosemide. IF you don't take the Furosemide, you do not need to take potassium - Keep your legs elevated above the level of your heart    Chronic Venous Insufficiency Chronic venous insufficiency, also called venous stasis, is a condition that prevents blood from being pumped effectively through the veins in your legs. Blood may no longer be pumped effectively from the legs back to the heart. This condition can range from mild to severe. With proper treatment, you should be able to continue with an active life. What are the causes? Chronic venous insufficiency occurs when the vein walls become stretched, weakened, or damaged, or when valves within the vein are damaged. Some common causes of this include:  High blood pressure inside the veins (venous hypertension).  Increased blood pressure in the leg veins from long periods of sitting or standing.  A blood clot that blocks blood flow in a vein (deep vein thrombosis, DVT).  Inflammation of a vein (phlebitis) that causes a blood clot to form.  Tumors in the pelvis that cause blood to back up.  What increases the risk? The following factors may make you more likely to develop this condition:  Having a family history of this condition.  Obesity.  Pregnancy.  Living without enough physical activity or exercise (sedentary lifestyle).  Smoking.  Having a job that requires long periods of standing or sitting in one place.  Being a certain age. Women in their 66s and 62s and men in their 15s are more likely to develop this condition.  What are the signs or symptoms? Symptoms of this condition include:  Veins that are enlarged, bulging, or twisted (varicose veins).  Skin breakdown or ulcers.  Reddened or discolored skin on the front of the leg.  Brown,  smooth, tight, and painful skin just above the ankle, usually on the inside of the leg (lipodermatosclerosis).  Swelling.  How is this diagnosed? This condition may be diagnosed based on:  Your medical history.  A physical exam.  Tests, such as: ? A procedure that creates an image of a blood vessel and nearby organs and provides information about blood flow through the blood vessel (duplex ultrasound). ? A procedure that tests blood flow (plethysmography). ? A procedure to look at the veins using X-ray and dye (venogram).  How is this treated? The goals of treatment are to help you return to an active life and to minimize pain or disability. Treatment depends on the severity of your condition, and it may include:  Wearing compression stockings. These can help relieve symptoms and help prevent your condition from getting worse. However, they do not cure the condition.  Sclerotherapy. This is a procedure involving an injection of a material that "dissolves" damaged veins.  Surgery. This may involve: ? Removing a diseased vein (vein stripping). ? Cutting off blood flow through the vein (laser ablation surgery). ? Repairing a valve.  Follow these instructions at home:  Wear compression stockings as told by your health care provider. These stockings help to prevent blood clots and reduce swelling in your legs.  Take over-the-counter and prescription medicines only as told by your health care provider.  Stay active by exercising, walking, or doing different activities. Ask your health care provider what activities are safe for you and how much exercise you need.  Drink enough fluid to keep your urine clear or pale yellow.  Do not use any products that contain nicotine or tobacco, such as cigarettes and e-cigarettes. If you need help quitting, ask your health care provider.  Keep all follow-up visits as told by your health care provider. This is important. Contact a health care  provider if:  You have redness, swelling, or more pain in the affected area.  You see a red streak or line that extends up or down from the affected area.  You have skin breakdown or a loss of skin in the affected area, even if the breakdown is small.  You get an injury in the affected area. Get help right away if:  You get an injury and an open wound in the affected area.  You have severe pain that does not get better with medicine.  You have sudden numbness or weakness in the foot or ankle below the affected area, or you have trouble moving your foot or ankle.  You have a fever and you have worse or persistent symptoms.  You have chest pain.  You have shortness of breath. Summary  Chronic venous insufficiency, also called venous stasis, is a condition that prevents blood from being pumped effectively through the veins in your legs.  Chronic venous insufficiency occurs when the vein walls become stretched, weakened, or damaged, or when valves within the vein are damaged.  Treatment for this condition depends on how severe your condition is, and it may involve wearing compression stockings or having a procedure.  Make sure you stay active by exercising, walking, or doing different activities. Ask your health care provider what activities are safe for you and how much exercise you need. This information is not intended to replace advice given to you by your health care provider. Make sure you discuss any questions you have with your health care provider. Document Released: 03/24/2007 Document Revised: 10/07/2016 Document Reviewed: 10/07/2016 Elsevier Interactive Patient Education  2017 [employer].

## 2018-08-12 DIAGNOSIS — M2550 Pain in unspecified joint: Secondary | ICD-10-CM | POA: Insufficient documentation

## 2018-08-13 ENCOUNTER — Ambulatory Visit: Payer: Medicare Other

## 2018-08-13 DIAGNOSIS — R5381 Other malaise: Secondary | ICD-10-CM | POA: Insufficient documentation

## 2018-08-13 DIAGNOSIS — G8929 Other chronic pain: Secondary | ICD-10-CM | POA: Insufficient documentation

## 2018-08-13 DIAGNOSIS — M25572 Pain in left ankle and joints of left foot: Secondary | ICD-10-CM

## 2018-08-14 DIAGNOSIS — I69398 Other sequelae of cerebral infarction: Secondary | ICD-10-CM | POA: Insufficient documentation

## 2018-08-14 DIAGNOSIS — R269 Unspecified abnormalities of gait and mobility: Secondary | ICD-10-CM

## 2018-08-16 ENCOUNTER — Encounter: Payer: Self-pay | Admitting: Physician Assistant

## 2018-08-16 DIAGNOSIS — Z9181 History of falling: Secondary | ICD-10-CM | POA: Insufficient documentation

## 2018-08-16 DIAGNOSIS — G20 Parkinson's disease: Secondary | ICD-10-CM | POA: Insufficient documentation

## 2018-08-16 DIAGNOSIS — G20A1 Parkinson's disease without dyskinesia, without mention of fluctuations: Secondary | ICD-10-CM | POA: Insufficient documentation

## 2018-08-16 MED ORDER — BETAMETHASONE DIPROPIONATE 0.05 % EX OINT
TOPICAL_OINTMENT | CUTANEOUS | Status: DC
Start: 2018-08-14 — End: 2018-08-16

## 2018-08-16 MED ORDER — MECLIZINE HCL 25 MG PO TABS
25.00 | ORAL_TABLET | ORAL | Status: DC
Start: ? — End: 2018-08-16

## 2018-08-16 MED ORDER — LORATADINE 10 MG PO TABS
10.00 | ORAL_TABLET | ORAL | Status: DC
Start: ? — End: 2018-08-16

## 2018-08-16 MED ORDER — CARVEDILOL 25 MG PO TABS
25.00 | ORAL_TABLET | ORAL | Status: DC
Start: 2018-08-14 — End: 2018-08-16

## 2018-08-16 MED ORDER — NITROGLYCERIN 0.4 MG SL SUBL
0.40 | SUBLINGUAL_TABLET | SUBLINGUAL | Status: DC
Start: ? — End: 2018-08-16

## 2018-08-16 MED ORDER — PANTOPRAZOLE SODIUM 20 MG PO TBEC
20.00 | DELAYED_RELEASE_TABLET | ORAL | Status: DC
Start: 2018-08-15 — End: 2018-08-16

## 2018-08-16 MED ORDER — HEPARIN SODIUM (PORCINE) 5000 UNIT/ML IJ SOLN
5000.00 | INTRAMUSCULAR | Status: DC
Start: 2018-08-14 — End: 2018-08-16

## 2018-08-16 MED ORDER — HYDROCORTISONE ACETATE 25 MG RE SUPP
25.00 | RECTAL | Status: DC
Start: ? — End: 2018-08-16

## 2018-08-16 MED ORDER — ALUM & MAG HYDROXIDE-SIMETH 200-200-20 MG/5ML PO SUSP
30.00 | ORAL | Status: DC
Start: ? — End: 2018-08-16

## 2018-08-16 MED ORDER — SODIUM CHLORIDE 0.9 % IV SOLN
10.00 | INTRAVENOUS | Status: DC
Start: ? — End: 2018-08-16

## 2018-08-16 MED ORDER — FUROSEMIDE 20 MG PO TABS
20.00 | ORAL_TABLET | ORAL | Status: DC
Start: 2018-08-15 — End: 2018-08-16

## 2018-08-16 MED ORDER — ROPINIROLE HCL 0.25 MG PO TABS
.25 | ORAL_TABLET | ORAL | Status: DC
Start: 2018-08-14 — End: 2018-08-16

## 2018-08-16 MED ORDER — DICYCLOMINE HCL 10 MG PO CAPS
10.00 | ORAL_CAPSULE | ORAL | Status: DC
Start: 2018-08-14 — End: 2018-08-16

## 2018-08-16 MED ORDER — GENERIC EXTERNAL MEDICATION
Status: DC
Start: ? — End: 2018-08-16

## 2018-08-16 MED ORDER — GENERIC EXTERNAL MEDICATION
500.00 | Status: DC
Start: ? — End: 2018-08-16

## 2018-08-16 MED ORDER — CLOTRIMAZOLE 1 % EX CREA
TOPICAL_CREAM | CUTANEOUS | Status: DC
Start: 2018-08-14 — End: 2018-08-16

## 2018-08-16 MED ORDER — CALCIUM CARBONATE 1250 (500 CA) MG PO TABS
1250.00 | ORAL_TABLET | ORAL | Status: DC
Start: 2018-08-15 — End: 2018-08-16

## 2018-08-16 MED ORDER — TRAMADOL HCL 50 MG PO TABS
50.00 | ORAL_TABLET | ORAL | Status: DC
Start: ? — End: 2018-08-16

## 2018-08-16 MED ORDER — MICONAZOLE NITRATE 2 % EX POWD
CUTANEOUS | Status: DC
Start: 2018-08-14 — End: 2018-08-16

## 2018-08-16 MED ORDER — LISINOPRIL 20 MG PO TABS
40.00 | ORAL_TABLET | ORAL | Status: DC
Start: 2018-08-15 — End: 2018-08-16

## 2018-08-16 MED ORDER — MAGNESIUM OXIDE 400 MG PO TABS
400.00 | ORAL_TABLET | ORAL | Status: DC
Start: 2018-08-15 — End: 2018-08-16

## 2018-08-16 MED ORDER — GENERIC EXTERNAL MEDICATION
10.00 | Status: DC
Start: ? — End: 2018-08-16

## 2018-08-16 MED ORDER — VALACYCLOVIR HCL 500 MG PO TABS
500.00 | ORAL_TABLET | ORAL | Status: DC
Start: 2018-08-15 — End: 2018-08-16

## 2018-08-16 MED ORDER — THERA PO TABS
1.00 | ORAL_TABLET | ORAL | Status: DC
Start: 2018-08-15 — End: 2018-08-16

## 2018-08-16 MED ORDER — VITAMIN E 180 MG (400 UNIT) PO CAPS
400.00 | ORAL_CAPSULE | ORAL | Status: DC
Start: 2018-08-15 — End: 2018-08-16

## 2018-08-16 NOTE — Progress Notes (Signed)
HPI:                                                                Dana Calderon is a 77 y.o. female who presents to [facility] today for edema follow-up   Dana Calderon returned to the ED yesterday for worsening leg swelling and pain.  She has been taking half a tablet of Lasix twice a day and is tolerating this, but she is very nervous to take a full tablet. She is afraid something bad will happen if she takes a full tablet before bed. She reduced her Amlodipine to 5 mg as instructed. She is still taking her Requip. She denies any new falls. She continues to deny chest pain, dyspnea,    Past Medical History:  Diagnosis Date  . Anxiety   . Back pain, chronic   . Breast cancer ([place])    left  . 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 GERD (gastroesophageal reflux disease)   . Hemorrhoid   . History of lacunar cerebrovascular accident (CVA) 07/25/2018   CT Head 07/24/18 Age-indeterminate lacunar infarct suggested region of the anterior internal capsule on the right, new from the previous study. Old lacunar infarct posterior subinsular region on the left.  Marland Kitchen HSV infection   . Hypertension   . 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 IBS (irritable bowel syndrome)   . Migraine   . Parkinson disease ([place])   . Sterile pyuria 03/06/2018  . Tremor of left hand   . Vertigo    Past Surgical History:  Procedure Laterality Date  . ABDOMINAL HYSTERECTOMY    . BREAST SURGERY    . HERNIA REPAIR    . KNEE ARTHROSCOPY Left   . LUMBAR DISC SURGERY    . MASTECTOMY Left   . TONSILLECTOMY    . TUBAL LIGATION     Social History   Tobacco Use  . Smoking status: Never Smoker  . Smokeless tobacco: Never Used  Substance Use Topics  . Alcohol use: No    Alcohol/week: 0.0 standard drinks   family history includes Hypertension in her mother; Stroke in her father.    ROS: negative except as noted in the  HPI  Medications: Current Outpatient Medications  Medication Sig Dispense Refill  . Acetaminophen (TYLENOL 8 HOUR ARTHRITIS PAIN PO) Take by mouth.    Marland Kitchen amLODipine (NORVASC) 5 MG tablet Take 0.5 tablets (2.5 mg total) by mouth daily. 90 tablet 0  . clobetasol cream (TEMOVATE) 2.68 % Apply 1 application topically 2 (two) times daily. 15 g 0  . dicyclomine (BENTYL) 10 MG capsule Take 1 capsule (10 mg total) by mouth 4 (four) times daily as needed for spasms. 120 capsule 5  . diphenhydrAMINE (BENADRYL) 25 MG tablet Take 1 tablet (25 mg total) by mouth at bedtime as needed for sleep. 30 tablet 0  . diphenoxylate-atropine (LOMOTIL) 2.5-0.025 MG tablet TAKE ONE TABLET BY MOUTH EVERY DAY AS NEEDED FOR DIARRHEA OR LOOSE STOOLS 90 tablet 0  . furosemide (LASIX) 20 MG tablet Take 1 tablet (20 mg total) by mouth daily. 30 tablet 3  . lidocaine (XYLOCAINE) 5 % ointment Apply 1 application as needed topically. 35.44 g 0  . lisinopril (PRINIVIL,ZESTRIL) 40 MG tablet Take  1 tablet (40 mg total) by mouth daily. 90 tablet 1  . loratadine (CLARITIN) 10 MG tablet Take 1 tablet (10 mg total) by mouth daily. 10 tablet 0  . meclizine (ANTIVERT) 12.5 MG tablet Take 1 tablet (12.5 mg total) by mouth 3 (three) times daily as needed for dizziness. 30 tablet 0  . Misc Natural Products (COSAMIN ASU ADVANCED FORMULA) CAPS Take 2 tablets by mouth 2 (two) times daily. 120 capsule 11  . Multiple Minerals-Vitamins (CALCIUM-MAGNESIUM-ZINC-D3 PO) Take 1 tablet by mouth 2 (two) times daily.    . Multiple Vitamin (MULTIVITAMIN) tablet Take 1 tablet by mouth daily.    . Nebivolol HCl (BYSTOLIC) 20 MG TABS Take 2 tablets (40 mg total) by mouth daily. 60 tablet 5  . NONFORMULARY OR COMPOUNDED ITEM ReadyWrap knee high medium compression Apply to both lower extremities daily 1 each 0  . nystatin-triamcinolone ointment (MYCOLOG) Apply 1 application topically 2 (two) times daily. 30 g 0  . omeprazole (PRILOSEC) 20 MG capsule Take 1  capsule (20 mg total) by mouth daily as needed (heartburn/indigestion). 90 capsule 0  . OVER THE COUNTER MEDICATION Take 1 capsule by mouth daily. "Mega Red"    . Polyethylene Glycol 400 (BLINK TEARS OP) Place 1 drop into both eyes as needed (for dry eyes).    . potassium chloride (K-DUR,KLOR-CON) 10 MEQ tablet Take 2 tablets (20 mEq total) by mouth daily. Take with Furosemide 30 tablet 3  . sodium chloride (OCEAN) 0.65 % SOLN nasal spray Place 1 spray into both nostrils as needed for congestion. 15 mL 0  . valACYclovir (VALTREX) 500 MG tablet Take 1 tablet (500 mg total) by mouth daily. 30 tablet 11  . Wheat Dextrin (BENEFIBER DRINK MIX PO) Take by mouth.     No current facility-administered medications for this visit.    Allergies  Allergen Reactions  . Cefuroxime Axetil Shortness Of Breath    Has had cephalexin (Keflex) without side effects  . Edarbi [Azilsartan] Other (See Comments)    Heart pounding, foot swelling  . Methylprednisolone Other (See Comments) and Hypertension    Raised blood pressure and sugar  . Nystop [Nystatin] Other (See Comments)    Burning skin and breathing problems  . Penicillins Other (See Comments)    Face broke out in lumps  . Red Dye Itching, Rash and Other (See Comments)    Fainting  . Tamiflu [Oseltamivir Phosphate] Diarrhea and Other (See Comments)    Fainting  . Verelan [Verapamil Hcl Er] Shortness Of Breath  . Zithromax [Azithromycin] Shortness Of Breath  . Carbidopa-Levodopa Itching and Other (See Comments)    Itchy ears, throat and tongue   . Doxazosin Mesylate Other (See Comments)    Fast pulse  . Hyoscyamine Other (See Comments)    Shakes, dizzy and headache  . Influenza Vaccines Other (See Comments)    "head in a vice", nasal itching  . Other Other (See Comments)    Cardiolyte injection - burning through body  . Xanax [Alprazolam] Itching  . Asa [Aspirin] Rash  . Atenolol Other (See Comments)    Unknown  . Avelox [Moxifloxacin] Other  (See Comments)    Unknown  . Benicar [Olmesartan] Other (See Comments)    Unknown  . Benzonatate Other (See Comments)    Unknown  . Bisoprolol Other (See Comments)    Unknown  . Cardizem [Diltiazem Hcl] Other (See Comments)    Unknown  . Carvedilol Other (See Comments)    Unknown  . Cefdinir  Other (See Comments)    Unknown  . Chlorthalidone Other (See Comments)    Unknown  . Ciprofloxacin Other (See Comments)    Heartburn  . Citalopram Other (See Comments)    Unknown  . Clonidine Derivatives Other (See Comments)    Unknown  . Diovan [Valsartan] Other (See Comments)    Unknown  . Doxycycline Other (See Comments)    Headache  . Erythromycin Other (See Comments)    Unknown  . Fioricet [Butalbital-Apap-Caffeine] Other (See Comments)    Headache and dizzy  . Flexeril [Cyclobenzaprine] Other (See Comments)    Unknown  . Furosemide Other (See Comments)    Unknown  . Hydralazine Other (See Comments)    Headache  . Hydroxyzine Other (See Comments)    nightmares  . Isosorbide Other (See Comments)    Unknown  . Lyrica [Pregabalin] Other (See Comments)    Unknown  . Macrobid [employer] Macro] Other (See Comments)    Unknown  . Mavik [Trandolapril] Other (See Comments)    Unknown  . Metronidazole Itching and Other (See Comments)    Dizzy  . Naproxen Other (See Comments)    Unknown  . Neo-Polycin Hc [Bacitra-Neomycin-Polymyxin-Hc] Itching  . Nexium [Esomeprazole Magnesium] Other (See Comments)    Headache  . Norflex [Orphenadrine Citrate] Other (See Comments)  . Oxybutynin Chloride Er Other (See Comments)    Unknown  . Paxil [Paroxetine Hcl] Other (See Comments)    Unknown  . Phenazopyridine Other (See Comments)    Unknown  . Prazosin Other (See Comments)    Unknown  . Prednisone Other (See Comments)    Unknown  . Promethazine Other (See Comments)    Unknown  . Prozac [Fluoxetine Hcl] Other (See Comments)    Unknown  . Spironolactone Other (See  Comments)    Unknown  . Sulfur Other (See Comments)    Unknown  . Tizanidine Other (See Comments)    Unknown  . Valium [Diazepam] Other (See Comments)    "Too strong?"  . Zoloft [Sertraline Hcl] Other (See Comments)    Unknown       Objective:  BP 110/69   Pulse 71   Wt 189 lb (85.7 kg)   BMI 30.05 kg/m  Gen:  alert, not ill-appearing, no distress, appropriate for age 60: head normocephalic without obvious abnormality, conjunctiva and cornea clear, trachea midline Pulm: Normal work of breathing, normal phonation, clear to auscultation bilaterally, no wheezes, rales or rhonchi CV: Normal rate, regular rhythm, s1 and s2 distinct, no murmurs, clicks or rubs  Neuro: alert and oriented x 3, left-sided tremor MSK: extremities atraumatic, slowed gait, ambulating with a walker, 4+ peripheral edema to the knee bilaterally, edema of the left hand and wrist Skin: intact, early venous stasis dermatitis of distal anterior lower extremities with several erythematous papules   No results found for this or any previous visit (from the past 72 hour(s)). No results found.    Assessment and Plan: 77 y.o. female with   .Diagnoses and all orders for this visit:  Venous insufficiency of both lower extremities -     NONFORMULARY OR COMPOUNDED ITEM; ReadyWrap knee high medium compression Apply to both lower extremities daily  Edema of both lower extremities due to peripheral venous insufficiency  At high risk for falls   - I am concerned about Dana Calderon's decline in functional status. Due to her Parkinson's and arthralgias, applying compression stockings is nearly impossible for her. She has had multiple falls and ED visits in  the past month. - patient placed in bilateral unna boots today. Will try knee high wraps as alternative to stockings - reassured and encouraged her to increase her Lasix to full tablet QAM and 1/2 tablet in the early evening. - re-educated that she only needs to take  1 potassium tablet with each dose of Lasix and not to take this if she is skipping a dose of Lasix   Patient education and anticipatory guidance given Patient agrees with treatment plan Follow-up in 1 week for unna boot change or sooner as needed if symptoms worsen or fail to improve  Darlyne Russian PA-C

## 2018-08-17 ENCOUNTER — Ambulatory Visit: Payer: Medicare Other | Admitting: Obstetrics & Gynecology

## 2018-08-18 ENCOUNTER — Ambulatory Visit: Payer: Medicare Other | Admitting: Physician Assistant

## 2018-08-19 ENCOUNTER — Encounter: Payer: Self-pay | Admitting: Physician Assistant

## 2018-08-19 ENCOUNTER — Other Ambulatory Visit: Payer: Self-pay | Admitting: Physician Assistant

## 2020-01-05 MED ORDER — ROPINIROLE HCL 0.25 MG PO TABS
0.25 | ORAL_TABLET | ORAL | Status: DC
Start: 2020-01-05 — End: 2020-01-05

## 2020-01-05 MED ORDER — MECLIZINE HCL 12.5 MG PO TABS
12.50 | ORAL_TABLET | ORAL | Status: DC
Start: ? — End: 2020-01-05

## 2020-01-05 MED ORDER — THERA PO TABS
1.00 | ORAL_TABLET | ORAL | Status: DC
Start: 2020-01-06 — End: 2020-01-05

## 2020-01-05 MED ORDER — DIPHENOXYLATE-ATROPINE 2.5-0.025 MG PO TABS
1.00 | ORAL_TABLET | ORAL | Status: DC
Start: ? — End: 2020-01-05

## 2020-01-05 MED ORDER — POTASSIUM CHLORIDE ER 10 MEQ PO TBCR
10.00 | EXTENDED_RELEASE_TABLET | ORAL | Status: DC
Start: 2020-01-06 — End: 2020-01-05

## 2020-01-05 MED ORDER — ACETAMINOPHEN 325 MG PO TABS
650.00 | ORAL_TABLET | ORAL | Status: DC
Start: ? — End: 2020-01-05

## 2020-01-05 MED ORDER — VALACYCLOVIR HCL 500 MG PO TABS
500.00 | ORAL_TABLET | ORAL | Status: DC
Start: 2020-01-06 — End: 2020-01-05

## 2020-01-05 MED ORDER — LISINOPRIL 20 MG PO TABS
20.00 | ORAL_TABLET | ORAL | Status: DC
Start: 2020-01-06 — End: 2020-01-05

## 2020-01-05 MED ORDER — NEBIVOLOL HCL 5 MG PO TABS
20.00 | ORAL_TABLET | ORAL | Status: DC
Start: 2020-01-05 — End: 2020-01-05

## 2020-01-05 MED ORDER — BUMETANIDE 1 MG PO TABS
0.50 | ORAL_TABLET | ORAL | Status: DC
Start: ? — End: 2020-01-05

## 2020-02-04 ENCOUNTER — Ambulatory Visit: Payer: Medicare Other | Admitting: Medical-Surgical

## 2020-02-29 ENCOUNTER — Inpatient Hospital Stay: Payer: Medicare PPO | Admitting: Osteopathic Medicine

## 2020-09-28 ENCOUNTER — Ambulatory Visit (INDEPENDENT_AMBULATORY_CARE_PROVIDER_SITE_OTHER): Payer: Medicare PPO

## 2020-09-28 ENCOUNTER — Other Ambulatory Visit: Payer: Self-pay

## 2020-09-28 ENCOUNTER — Encounter: Payer: Self-pay | Admitting: Sports Medicine

## 2020-09-28 ENCOUNTER — Ambulatory Visit (INDEPENDENT_AMBULATORY_CARE_PROVIDER_SITE_OTHER): Payer: Medicare PPO | Admitting: Sports Medicine

## 2020-09-28 DIAGNOSIS — M4135 Thoracogenic scoliosis, thoracolumbar region: Secondary | ICD-10-CM

## 2020-09-28 DIAGNOSIS — G8929 Other chronic pain: Secondary | ICD-10-CM

## 2020-09-28 DIAGNOSIS — R0781 Pleurodynia: Secondary | ICD-10-CM | POA: Diagnosis not present

## 2020-09-28 DIAGNOSIS — M546 Pain in thoracic spine: Secondary | ICD-10-CM | POA: Diagnosis not present

## 2020-09-28 MED ORDER — DEXAMETHASONE 4 MG PO TABS: 4.0000 mg | ORAL_TABLET | Freq: Three times a day (TID) | ORAL | 0 refills | Status: AC

## 2020-09-28 NOTE — Progress Notes (Signed)
    Procedures performed today:    None.  Independent interpretation of notes and tests performed by another provider:   None.  Brief History, Exam, Impression, and Recommendations:    Chronic midline thoracic back pain This is a 79 year old female, she resides at [facility]. 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  She had x-rays done at [facility] facility, only of her shoulder that only showed chronic osteoarthritis. She tells me today that the predominant pain is in the her midline/right paraspinal thoracic spine, and this has been present chronically. I am going to go ahead and x-ray her thoracic spine, right-sided rib cage looking for any acute findings but I do suspect her problems are spondylitic in nature. Adding 5 days of Decadron, declines prednisone. We will do some formal physical therapy, she does have PT at the residential facility. I would like to see her back in approximately 4 to 6 weeks, and we can consider advanced imaging if no better. The discussion was made in the room with the attendant present.    ___________________________________________ Gwen Her. Dianah Field, M.D., ABFM., CAQSM. Primary Care and [place] Instructor of [place] [facility] [employer]

## 2020-09-28 NOTE — Assessment & Plan Note (Addendum)
This is a 79 year old female, she resides at [facility]. 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  She had x-rays done at [facility] facility, only of her shoulder that only showed chronic osteoarthritis. She tells me today that the predominant pain is in the her midline/right paraspinal thoracic spine, and this has been present chronically. I am going to go ahead and x-ray her thoracic spine, right-sided rib cage looking for any acute findings but I do suspect her problems are spondylitic in nature. Adding 5 days of Decadron, declines prednisone. We will do some formal physical therapy, she does have PT at the residential facility. I would like to see her back in approximately 4 to 6 weeks, and we can consider advanced imaging if no better. The discussion was made in the room with the attendant present.

## 2020-10-30 ENCOUNTER — Ambulatory Visit: Payer: Medicare PPO | Admitting: Sports Medicine
